# Patient Record
Sex: Male | Born: 1935 | Race: Black or African American | Hispanic: No | Marital: Single | Smoking: Former smoker
Health system: Southern US, Community
[De-identification: ages and names within clinical notes are randomized; demographics above are authoritative.]

## PROBLEM LIST (undated history)

## (undated) DIAGNOSIS — Z720 Tobacco use: Secondary | ICD-10-CM

## (undated) DIAGNOSIS — I10 Essential (primary) hypertension: Secondary | ICD-10-CM

## (undated) DIAGNOSIS — R0602 Shortness of breath: Secondary | ICD-10-CM

## (undated) DIAGNOSIS — E785 Hyperlipidemia, unspecified: Secondary | ICD-10-CM

## (undated) DIAGNOSIS — Z9289 Personal history of other medical treatment: Secondary | ICD-10-CM

## (undated) DIAGNOSIS — I4589 Other specified conduction disorders: Principal | ICD-10-CM

## (undated) HISTORY — DX: Personal history of other medical treatment: Z92.89

## (undated) HISTORY — DX: Shortness of breath: R06.02

## (undated) HISTORY — DX: Essential (primary) hypertension: I10

## (undated) HISTORY — DX: Other specified conduction disorders: I45.89

## (undated) HISTORY — DX: Tobacco use: Z72.0

## (undated) HISTORY — DX: Hyperlipidemia, unspecified: E78.5

---

## 2000-06-29 ENCOUNTER — Ambulatory Visit (HOSPITAL_COMMUNITY): Admission: RE | Admit: 2000-06-29 | Discharge: 2000-06-29 | Payer: Self-pay | Admitting: *Deleted

## 2000-06-29 ENCOUNTER — Encounter (INDEPENDENT_AMBULATORY_CARE_PROVIDER_SITE_OTHER): Payer: Self-pay | Admitting: Specialist

## 2006-03-21 ENCOUNTER — Encounter (INDEPENDENT_AMBULATORY_CARE_PROVIDER_SITE_OTHER): Payer: Self-pay | Admitting: *Deleted

## 2006-03-21 ENCOUNTER — Ambulatory Visit (HOSPITAL_COMMUNITY): Admission: RE | Admit: 2006-03-21 | Discharge: 2006-03-21 | Payer: Self-pay | Admitting: *Deleted

## 2007-11-06 ENCOUNTER — Ambulatory Visit (HOSPITAL_COMMUNITY): Admission: RE | Admit: 2007-11-06 | Discharge: 2007-11-06 | Payer: Self-pay | Admitting: *Deleted

## 2010-09-21 NOTE — Op Note (Signed)
NAMESAMRAT, HAYWARD NO.:  [id number]   MEDICAL RECORD NO.:  [id number]          PATIENT TYPE:  AMB   LOCATION:  ENDO                         FACILITY:  [facility]   PHYSICIAN:  Georgiana Spinner, M.D.    DATE OF BIRTH:  December 19, 1935   DATE OF PROCEDURE:  DATE OF DISCHARGE:                               OPERATIVE REPORT   PROCEDURE:  Colonoscopy.   INDICATIONS:  Colon polyps.   ANESTHESIA:  Fentanyl 100 mcg, Versed 8 mg.   DESCRIPTION OF PROCEDURE:  With the patient mildly sedated in the left  lateral decubitus position a rectal exam was performed, which was  unremarkable.  The prostate felt normal to my exam.  Subsequently, the  Pentax videoscopic colonoscope was inserted into the rectum and passed  under direct vision to the cecum, identified by the ileocecal valve and  appendiceal orifice. both of which were photographed.  From this point,  the colonoscope was slowly withdrawn, taking circumferential views of  colonic mucosa, stopping to photograph diverticulosis seen along the way  that was moderately severe in the sigmoid colon until we reached the  rectum which appeared normal on direct and showed hemorrhoids on  retroflexed view.  The endoscope was straightened and withdrawn.  The  patient's vital signs and pulse oximeter remained stable.  The patient  tolerated the procedure well without apparent complications.   FINDINGS:  Internal hemorrhoids, diverticulosis, moderately severe of  the sigmoid colon.  There was a minimal amount of redness at the distal  end of the sigmoid, indicating some mild inflammation in this area.   PLAN:  I will have patient follow-up with me as needed and approximately  5 years for repeat examination.           ______________________________  Georgiana Spinner, M.D.     GMO/MEDQ  D:  11/06/2007  T:  11/06/2007  Job:  [id number]   cc:   Brett Canales A. Cleta Alberts, M.D.  Fax: [phone]

## 2010-09-24 NOTE — Op Note (Signed)
Louis Bryan, Louis Bryan NO.:  [id number]   MEDICAL RECORD NO.:  [id number]          PATIENT TYPE:  AMB   LOCATION:  ENDO                         FACILITY:  [facility]   PHYSICIAN:  Georgiana Spinner, M.D.    DATE OF BIRTH:  [birth year]   DATE OF PROCEDURE:  03/21/2006  DATE OF DISCHARGE:                                 OPERATIVE REPORT   PROCEDURE:  Colonoscopy with polypectomy and biopsy.   ENDOSCOPIST:  Georgiana Spinner, M.D.   INDICATIONS:  Colon polyps.   ANESTHESIA:  Fentanyl 100 mcg, Versed 10 mg.   PROCEDURE:  With the patient mildly sedated in the left lateral decubitus  position, the Olympus videoscopic colonoscope was inserted into the rectum  after a rectal examination was attempted and passed under direct vision to  the cecum, identified by crow's foot of the cecum and ileocecal valve. In  the ascending colon were 2 very large polyps, both over a centimeter in  size.  Both were photographed and each was removed, the first, the larger,  of which was removed first using snare cautery technique, setting of 20/200  blended current.  The polyp base had to be touched with the hot biopsy  forceps to eradicate the polypoid tissue remaining at the base and the polyp  then was grasped with a Roth retrieval basket and pulled out all the way to  retrieve it.  Once the tissue was retrieved for Pathology, the endoscope was  then reinserted in the rectum and once again passed under direct vision all  the way back to the cecum for the second time, where the second polyp was  seen; it too was then removed using snare cautery technique after placing  the colonoscope in the correct position and once it had been resected, the  polyp was once again contained in a Roth retrieval basket and the  colonoscope was withdrawn all the way out once again so that the second  polyp could be placed in the specimen jar with the first, since they were  adjacent.  The endoscope was then reinserted  and passed under direct vision  once again for the third time, back to the cecum, from which point the  colonoscope was then slowly withdrawn, taking circumferential views of the  colonic mucosa as we withdrew all the way then to the rectum, which appeared  clean, and rinsing and suctioning as we went until we reached the rectum,  which appeared normal on direct and showed hemorrhoids on retroflexed view.  The endoscope was straightened and withdrawn.  The patient's vital signs and  pulse oximetry remained stable.  The patient tolerated the procedure well  and without apparent complication.   FINDINGS:  1. Two large polyps of the ascending colon, both removed.  2. Diverticulosis throughout the colon, mostly in the sigmoid area.  3. Internal hemorrhoids were noted.   PLAN:  Await biopsy report.  The patient will call me for results and follow  up with me as an outpatient and probably because of the size of these  polyps, I would  recommend repeat examination in 1 year.           ______________________________  Georgiana Spinner, M.D.     GMO/MEDQ  D:  03/21/2006  T:  03/21/2006  Job:  [id number]   cc:   Jonita Albee, M.D.

## 2010-09-24 NOTE — Procedures (Signed)
[facility]  Patient:    Louis Bryan, Louis Bryan                           MRN: [id number] Proc. Date: 06/29/00 Adm. Date:  [id number] Attending:  Sabino Gasser CC:         Robert Bellow, M.D., [facility]   Procedure Report  PROCEDURE:  Colonoscopy with biopsy, polypectomy.  INDICATION FOR PROCEDURE:  Iron deficiency anemia, Hemoccult positive stools.  ANESTHESIA:  An additional Demerol 10 mg, Versed 1 mg.  DESCRIPTION OF PROCEDURE:  With the patient mildly sedated in the left lateral decubitus position, the Olympus videoscopic colonoscope was inserted in the rectum and passed under direct vision to the cecum. The cecum was identified by the ileocecal valve and appendiceal orifice both of which were photographed and appeared normal. We entered into the terminal ileum which appeared to be mildly inflamed but probably nonspecifically. This was photographed and biopsies were taken. The endoscope was then withdrawn taking circumferential views of the entire colonic mucosa, stopping about 1-2 folds removed from the ileocecal valve when a small crescent-like protuberance was noted. It appeared to be a small lipoma but it was biopsied only at this point. From this point, the endoscope was then withdrawn further until we reached near the hepatic flexure area where a very large multilobulated polyp was seen on a short stalk. After positioning the scope in the appropriate area, we used snare cautery technique in a piecemeal fashion to remove the multiple heads of this polyp. After doing so and feeling assured that we had removed all the polypoid tissue down to the base using the snare cautery technique with blended current on 3:3 settings. A Roth retrieval net was used to retrieve the large polypoid pieces with multiple trips back and forth through the colon to retrieve the polyp substance. Once it was fully retrieved by this manner, the endoscope was reinserted to this  level and slowly withdrawn taking circumferential views of the remaining colonic mucosa stopping then in the rectum where a fold appeared to be thickened and it was felt this may have been due to the repeated need to insert the colonoscope but it was biopsied anyway. To be certain of this, the endoscope was then pulled back further and placed in retroflexion to view the anal canal from above and this too appeared normal. The endoscope was then straightened and withdrawn. The patients vital signs and pulse oximeter remained stable. The patient tolerated the procedure well without apparent complications.  FINDINGS: 1. Mild inflammatory changes of terminal ileum possibly benign normal variant. 2. 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 3. Large polyp on a stalk in the hepatic flexure area of the colon. 4. Thickened fold of the rectum.  PLAN:  Await biopsy report. The patient will call me for results and followup with me in as an outpatient. DD:  06/29/00 TD:  06/30/00 Job: [id number]

## 2010-09-24 NOTE — Procedures (Signed)
[facility]  Patient:    Louis Bryan, Louis Bryan                           MRN: [id number] Proc. Date: 06/29/00 Adm. Date:  [id number] Attending:  Sabino Gasser CC:         Robert Bellow, M.D., [facility]   Procedure Report  PROCEDURE:  Upper endoscopy.  INDICATION FOR PROCEDURE:  Iron deficiency anemia, heme positive stools.  ANESTHESIA:  Demerol 70 mg, Versed 7 mg.  DESCRIPTION OF PROCEDURE:  With the patient mildly sedated in the left lateral decubitus position, the Olympus videoscopic endoscope was inserted in the mouth and passed under direct vision through the esophagus into the stomach. The fundus, body, antrum, duodenal bulb, and second portion of the duodenum were all well visualized and appeared normal. From this point, the endoscope was slowly withdrawn taking circumferential views of the entire duodenal mucosa until the endoscope was then pulled back into the stomach, placed in retroflexion to view the stomach from below, this too appeared normal and was photographed. The endoscope was straightened and withdrawn taking circumferential views of the entire gastric and subsequently esophageal mucosa stopping only in the fundus of the stomach where multiple small volcano-like erosions were seen. They had erythematous tops and multiple biopsies were taken. The endoscope was withdrawn. The patients vital signs and pulse oximeter remained stable. The patient tolerated the procedure well without apparent complications.  FINDINGS:  Multiple volcano-like erosions of the fundus of the stomach possibly NSAID induced. Await biopsy report. The patient will call me for results and follow-up with me as an outpatient. Proceed to colonoscopy. DD:  06/29/00 TD:  06/30/00 Job: [id number]

## 2011-05-24 ENCOUNTER — Ambulatory Visit (INDEPENDENT_AMBULATORY_CARE_PROVIDER_SITE_OTHER): Payer: Medicare Other | Admitting: Emergency Medicine

## 2011-05-24 DIAGNOSIS — E119 Type 2 diabetes mellitus without complications: Secondary | ICD-10-CM

## 2011-05-24 DIAGNOSIS — I10 Essential (primary) hypertension: Secondary | ICD-10-CM

## 2011-09-20 ENCOUNTER — Encounter: Payer: Self-pay | Admitting: Emergency Medicine

## 2011-09-20 ENCOUNTER — Ambulatory Visit (INDEPENDENT_AMBULATORY_CARE_PROVIDER_SITE_OTHER): Payer: Medicare Other | Admitting: Emergency Medicine

## 2011-09-20 VITALS — BP 138/83 | HR 67 | Temp 97.8°F | Resp 18 | Ht 67.0 in | Wt 195.8 lb

## 2011-09-20 DIAGNOSIS — E785 Hyperlipidemia, unspecified: Secondary | ICD-10-CM

## 2011-09-20 DIAGNOSIS — I10 Essential (primary) hypertension: Secondary | ICD-10-CM | POA: Insufficient documentation

## 2011-09-20 LAB — COMPREHENSIVE METABOLIC PANEL
AST: 15 U/L (ref 0–37)
Albumin: 4.4 g/dL (ref 3.5–5.2)
Alkaline Phosphatase: 90 U/L (ref 39–117)
CO2: 27 mEq/L (ref 19–32)
Calcium: 9.9 mg/dL (ref 8.4–10.5)
Glucose, Bld: 106 mg/dL — ABNORMAL HIGH (ref 70–99)
Potassium: 4.1 mEq/L (ref 3.5–5.3)
Sodium: 141 mEq/L (ref 135–145)
Total Protein: 7.6 g/dL (ref 6.0–8.3)

## 2011-09-20 LAB — LIPID PANEL
HDL: 41 mg/dL (ref >39)
LDL Cholesterol: 97 mg/dL (ref 0–99)
Total CHOL/HDL Ratio: 4.1 Ratio
VLDL: 30 mg/dL (ref 0–40)

## 2011-09-20 MED ORDER — LISINOPRIL-HYDROCHLOROTHIAZIDE 10-12.5 MG PO TABS: 1.0000 | ORAL_TABLET | Freq: Every day | ORAL | Status: DC

## 2011-09-20 MED ORDER — METOPROLOL SUCCINATE ER 50 MG PO TB24: 50.0000 mg | ORAL_TABLET | Freq: Every day | ORAL | Status: DC

## 2011-09-20 MED ORDER — SIMVASTATIN 20 MG PO TABS: 20.0000 mg | ORAL_TABLET | Freq: Every evening | ORAL | Status: DC

## 2011-09-20 NOTE — Progress Notes (Signed)
  Subjective:    Patient ID: Louis Bryan, male    DOB: 1936-01-24, 76 y.o.   MRN: [id number]  HPI patient enters for followup high cholesterol and high blood pressure. He has no chest pain no shortness of breath no swelling in his ankles. He is tolerating his medications without any difficulty    Review of Systems     Objective:   Physical Exam  Constitutional: He appears well-developed.  Eyes: Pupils are equal, round, and reactive to light.  Neck: No thyromegaly present.  Cardiovascular: Normal rate and regular rhythm.   Pulmonary/Chest: Effort normal and breath sounds normal. No respiratory distress.  Abdominal: Soft.  Musculoskeletal: Normal range of motion.          Assessment & Plan:  Patient did well all medications refilled have ordered a cmet and lipid panel

## 2011-09-21 ENCOUNTER — Encounter: Payer: Self-pay | Admitting: *Deleted

## 2011-10-06 ENCOUNTER — Ambulatory Visit: Payer: Medicare Other

## 2011-10-06 ENCOUNTER — Ambulatory Visit (INDEPENDENT_AMBULATORY_CARE_PROVIDER_SITE_OTHER): Payer: Medicare Other | Admitting: Family Medicine

## 2011-10-06 VITALS — BP 116/71 | HR 67 | Temp 97.5°F | Resp 18 | Ht 67.0 in | Wt 194.2 lb

## 2011-10-06 DIAGNOSIS — M25569 Pain in unspecified knee: Secondary | ICD-10-CM

## 2011-10-06 NOTE — Progress Notes (Signed)
Patient Name: Louis Bryan Date of Birth: Jan 14, 1936 Medical Record Number: [id number] Gender: male Date of Encounter: 10/06/2011  History of Present Illness:  Louis Bryan is a 76 y.o. very pleasant male patient who presents with the following:  Has noted right knee pain for about 2 days. No known injury, started "all of a sudden."  He has noted some popping and the knee has felt unstable at times.  Today it actually seems better and is not really hurting.  This has never happened before, no history of knee surgery.  Otherwise he is feeling well today  Patient Active Problem List  Diagnoses  . Hypertension  . Hyperlipidemia   No past medical history on file. No past surgical history on file. History  Substance Use Topics  . Smoking status: Former Games developer  . Smokeless tobacco: Not on file  . Alcohol Use: Not on file   No family history on file. No Known Allergies  Medication list has been reviewed and updated.  Prior to Admission medications   Medication Sig Start Date End Date Taking? Authorizing Provider  aspirin 81 MG tablet Take 81 mg by mouth daily.   Yes Historical Provider, MD  docusate sodium (COLACE) 50 MG capsule Take by mouth daily. Patient takes OTC stool softener in addition to colace tablet.   Yes Historical Provider, MD  lisinopril-hydrochlorothiazide (PRINZIDE,ZESTORETIC) 10-12.5 MG per tablet Take 1 tablet by mouth daily. 09/20/11  Yes Collene Gobble, MD  metoprolol succinate (TOPROL-XL) 50 MG 24 hr tablet Take 1 tablet (50 mg total) by mouth daily. Take with or immediately following a meal. 09/20/11  Yes Collene Gobble, MD  simvastatin (ZOCOR) 20 MG tablet Take 1 tablet (20 mg total) by mouth every evening. 09/20/11  Yes Collene Gobble, MD    Review of Systems: As per HPI- otherwise negative.   Physical Examination: Filed Vitals:   10/06/11 0746  BP: 116/71  Pulse: 67  Temp: 97.5 F (36.4 C)  Resp: 18   Filed Vitals:   10/06/11 0746  Height: 5\' 7"  (1.702 m)    Weight: 194 lb 3.2 oz (88.089 kg)   Body mass index is 30.42 kg/(m^2).  GEN: WDWN, NAD, Non-toxic, A & O x 3 HEENT: Atraumatic, Normocephalic. Neck supple. No masses, No LAD. Ears and Nose: No external deformity. CV: RRR, No M/G/R. No JVD. No thrill. No extra heart sounds. PULM: CTA B, no wheezes, crackles, rhonchi. No retractions. No resp. distress. No accessory muscle use. EXTR: No c/c/e NEURO Normal gait- no limp PSYCH: Normally interactive. Conversant. Not depressed or anxious appearing.  Calm demeanor.  Right knee: no effusion, no heat or redness/ swelling.  There is some laxity to valgus stress, but no pain.  Normal patellar DTR bilaterally, normal ROM and strength to knee flexion and extension.  No sign of ACL tear  [facility] reading (PRIMARY) by  Dr. Patsy Lager.  Right knee: patellofemoral compartment loss- otherwise normal knee  RIGHT KNEE - COMPLETE 4+ VIEW  Comparison: None.  Findings: No joint effusion or fracture. No significant degenerative changes.  IMPRESSION: No acute findings.  Assessment and Plan: 1. 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  However, today he is feeling pretty well and the knee feels a lot better.  Gave copy of xrays on CD.  He may use OTC tylenol as needed for pain.  If his symptoms return to the extent that he wants to look at  this further he will call me and I will refer him to ortho  Abbe Amsterdam, MD 10/06/2011 7:59 AM

## 2012-01-24 ENCOUNTER — Ambulatory Visit (INDEPENDENT_AMBULATORY_CARE_PROVIDER_SITE_OTHER): Payer: Medicare Other | Admitting: Emergency Medicine

## 2012-01-24 VITALS — BP 131/88 | HR 58 | Temp 97.4°F | Resp 16 | Ht 67.5 in | Wt 197.0 lb

## 2012-01-24 DIAGNOSIS — Z23 Encounter for immunization: Secondary | ICD-10-CM

## 2012-01-24 DIAGNOSIS — I10 Essential (primary) hypertension: Secondary | ICD-10-CM

## 2012-01-24 DIAGNOSIS — E785 Hyperlipidemia, unspecified: Secondary | ICD-10-CM

## 2012-01-24 DIAGNOSIS — E782 Mixed hyperlipidemia: Secondary | ICD-10-CM

## 2012-01-24 NOTE — Progress Notes (Signed)
  Subjective:    Patient ID: Louis Bryan, male    DOB: 1935-08-10, 76 y.o.   MRN: [id number]  HPI patient enters for followup of his high blood pressure and high cholesterol. He has no complaints at the present time he has no chest pain shortness of breath swelling of his legs he feels great.    Review of Systems     Objective:   Physical Exam  Constitutional: He is oriented to person, place, and time. He appears well-developed.  HENT:  Head: Normocephalic.  Eyes: Pupils are equal, round, and reactive to light.  Neck: No thyromegaly present.  Cardiovascular: Normal rate and regular rhythm.  Exam reveals no gallop and no friction rub.   No murmur heard. Pulmonary/Chest: Effort normal and breath sounds normal. No respiratory distress. He has no wheezes. He has no rales. He exhibits no tenderness.  Abdominal: Soft. Bowel sounds are normal. He exhibits no distension and no mass. There is no tenderness. There is no rebound and no guarding.  Musculoskeletal: Normal range of motion.  Neurological: He is alert and oriented to person, place, and time.          Assessment & Plan:  Routine labs  not done today because patient is doing so well. He'll continue on all his current medications. He will return to clinic in January have his physical at that time. Flu shot was administered this visit

## 2012-06-05 ENCOUNTER — Ambulatory Visit (INDEPENDENT_AMBULATORY_CARE_PROVIDER_SITE_OTHER): Payer: Medicare Other | Admitting: Emergency Medicine

## 2012-06-05 ENCOUNTER — Encounter: Payer: Self-pay | Admitting: Emergency Medicine

## 2012-06-05 ENCOUNTER — Ambulatory Visit: Payer: Medicare Other

## 2012-06-05 VITALS — BP 133/85 | HR 67 | Temp 98.2°F | Resp 16 | Ht 68.0 in | Wt 198.0 lb

## 2012-06-05 DIAGNOSIS — M542 Cervicalgia: Secondary | ICD-10-CM

## 2012-06-05 DIAGNOSIS — Z139 Encounter for screening, unspecified: Secondary | ICD-10-CM

## 2012-06-05 DIAGNOSIS — Z0000 Encounter for general adult medical examination without abnormal findings: Secondary | ICD-10-CM

## 2012-06-05 DIAGNOSIS — M549 Dorsalgia, unspecified: Secondary | ICD-10-CM

## 2012-06-05 LAB — PSA, MEDICARE: PSA: 0.66 ng/mL (ref <=4.00)

## 2012-06-05 LAB — COMPREHENSIVE METABOLIC PANEL
ALT: 13 U/L (ref 0–53)
BUN: 13 mg/dL (ref 6–23)
CO2: 22 mEq/L (ref 19–32)
Chloride: 105 mEq/L (ref 96–112)
Creat: 1.21 mg/dL (ref 0.50–1.35)
Glucose, Bld: 110 mg/dL — ABNORMAL HIGH (ref 70–99)
Potassium: 3.9 mEq/L (ref 3.5–5.3)
Sodium: 142 mEq/L (ref 135–145)
Total Bilirubin: 0.8 mg/dL (ref 0.3–1.2)
Total Protein: 7.3 g/dL (ref 6.0–8.3)

## 2012-06-05 LAB — IFOBT (OCCULT BLOOD): IFOBT: NEGATIVE

## 2012-06-05 LAB — LIPID PANEL
Cholesterol: 164 mg/dL (ref 0–200)
HDL: 45 mg/dL (ref >39)
LDL Cholesterol: 93 mg/dL (ref 0–99)
Total CHOL/HDL Ratio: 3.6 Ratio
Triglycerides: 129 mg/dL (ref <150)
VLDL: 26 mg/dL (ref 0–40)

## 2012-06-05 LAB — CBC WITH DIFFERENTIAL/PLATELET
MCH: 31.2 pg (ref 26.0–34.0)
MCV: 90.2 fL (ref 78.0–100.0)
Monocytes Absolute: 0.8 10*3/uL (ref 0.1–1.0)
Neutrophils Relative %: 49 % (ref 43–77)
Platelets: 233 10*3/uL (ref 150–400)
RDW: 14.5 % (ref 11.5–15.5)
WBC: 8.8 10*3/uL (ref 4.0–10.5)

## 2012-06-05 NOTE — Progress Notes (Signed)
@[facility] @  Patient ID: Jaclyn Carew MRN: [id number], DOB: [birth year] 77 y.o. Date of Encounter: 06/05/2012, 10:21 AM  Primary Physician: No primary provider on file.  Chief Complaint: Physical (CPE)  HPI: 77 y.o. y/o male with history noted below here for CPE.  Doing well. No issues/complaints.  Review of Systems:  Consitutional: No fever, chills, fatigue, night sweats, lymphadenopathy, or weight changes. Eyes: No visual changes, eye redness is a continued problem but only in the morning and he does not have to use drops for this, or discharge. ENT/Mouth: Ears: No otalgia, tinnitus, hearing loss, discharge. Nose: No congestion, rhinorrhea, sinus pain, or epistaxis. Throat: No sore throat, post nasal drip, or teeth pain. Cardiovascular: No CP, palpitations, diaphoresis, DOE, edema, orthopnea, PND. Respiratory: No cough, hemoptysis, SOB, or wheezing. Gastrointestinal: No anorexia, dysphagia, reflux, pain, nausea, vomiting, hematemesis, diarrhea, constipation, BRBPR, or melena. Genitourinary: No dysuria, frequency, urgency, hematuria, incontinence, nocturia, decreased urinary stream, discharge, impotence, or testicular pain/masses. Musculoskeletal: No decreased ROM, myalgias, stiffness, joint swelling, or weakness he is difficulty with his neck and his low back and pain when he moves at times.. Skin: No rash, erythema, lesion changes, pain, warmth, jaundice, or pruritis. Neurological: No headache, dizziness, syncope, seizures, tremors, memory loss, coordination problems, or paresthesias. Psychological: No anxiety, depression, hallucinations, SI/HI. Endocrine: No fatigue, polydipsia, polyphagia, polyuria, or known diabetes. All other systems were reviewed and are otherwise negative.  No past medical history on file.   No past surgical history on file.  Home Meds:  Prior to Admission medications   Medication Sig Start Date End Date Taking? Authorizing Provider  aspirin 81 MG tablet Take 81  mg by mouth daily.   Yes Historical Provider, MD  docusate sodium (COLACE) 50 MG capsule Take by mouth daily. Patient takes OTC stool softener in addition to colace tablet.   Yes Historical Provider, MD  lisinopril-hydrochlorothiazide (PRINZIDE,ZESTORETIC) 10-12.5 MG per tablet Take 1 tablet by mouth daily. 09/20/11  Yes Collene Gobble, MD  metoprolol succinate (TOPROL-XL) 50 MG 24 hr tablet Take 1 tablet (50 mg total) by mouth daily. Take with or immediately following a meal. 09/20/11  Yes Collene Gobble, MD  simvastatin (ZOCOR) 20 MG tablet Take 1 tablet (20 mg total) by mouth every evening. 09/20/11  Yes Collene Gobble, MD    Allergies: No Known Allergies  History   Social History  . Marital Status: Single    Spouse Name: N/A    Number of Children: N/A  . Years of Education: N/A   Occupational History  . Not on file.   Social History Main Topics  . Smoking status: Former Games developer  . Smokeless tobacco: Not on file  . Alcohol Use: Not on file  . Drug Use: Not on file  . Sexually Active: Not on file   Other Topics Concern  . Not on file   Social History Narrative  . No narrative on file    No family history on file.  Physical Exam:  Blood pressure 133/85, pulse 67, temperature 98.2 F (36.8 C), resp. rate 16, height 5\' 8"  (1.727 m), weight 198 lb (89.812 kg).  General: Well developed, well nourished, in no acute distress. HEENT: Normocephalic, atraumatic. Conjunctiva pink, sclera non-icteric. Pupils 2 mm constricting to 1 mm, round, regular, and equally reactive to light and accomodation. EOMI. Internal auditory canal clear. TMs with good cone of light and without pathology. Nasal mucosa pink. Nares are without discharge. No sinus tenderness. Oral mucosa pink. Dentition . Pharynx without exudate.  Neck: Supple. Trachea midline. No thyromegaly. Full ROM. No lymphadenopathy. Lungs: Clear to auscultation bilaterally without wheezes, rales, or rhonchi. Breathing is of normal effort  and unlabored. Cardiovascular: RRR with S1 S2. No murmurs, rubs, or gallops appreciated. Distal pulses 2+ symmetrically. No carotid or abdominal bruits.  Abdomen: Soft, non-tender, non-distended with normoactive bowel sounds. No hepatosplenomegaly or masses. No rebound/guarding. No CVA tenderness. He does have a small umbilical hernia    Rectal: No external hemorrhoids or fissures. Rectal vault without masses.   Genitourinary: un circumcised male. No penile lesions. Testes descended bilaterally, and smooth without tenderness or masses.  Musculoskeletal: Full range of motion and 5/5 strength throughout. Without swelling, atrophy, there is tenderness noted over the paracervical and also over the paralumbar muscles without focal neurological defects the  crepitus, or warmth. Extremities without clubbing, cyanosis, or edema. Calves supple. Skin: Warm and moist without erythema, ecchymosis, wounds, or rash. Neuro: A+Ox3. CN II-XII grossly intact. Moves all extremities spontaneously. Full sensation throughout. Normal gait. DTR 2+ throughout upper and lower extremities. Finger to nose intact. Psych:  Responds to questions appropriately with a normal affect.  [facility] reading (PRIMARY) by  Dr. Cleta Alberts LS-spine films show mild arthritic change. C-spine films C6 and 7 are not well-seen. There are some mild degenerative changes present of the mid C-spine. .  Studies: CBC, CMET, Lipid, PSA, TSH,       Assessment/Plan:  77 y.o. y/o   male here for CPE he appears he is up-to-date on all his immunizations except for shingles. He is not due yet for colonoscopy. We'll check films of his C-spine and lumbar spine to get an idea of how bad his arthritis is. He does have high blood pressure and high cholesterol and has not had a cardiology evaluation for years.  -  Signed, Earl Lites, MD 06/05/2012 10:21 AM

## 2012-06-13 ENCOUNTER — Ambulatory Visit (INDEPENDENT_AMBULATORY_CARE_PROVIDER_SITE_OTHER): Payer: Medicare Other | Admitting: Family Medicine

## 2012-06-13 VITALS — BP 124/76 | HR 72 | Temp 98.6°F | Resp 20 | Ht 67.0 in | Wt 197.6 lb

## 2012-06-13 DIAGNOSIS — R05 Cough: Secondary | ICD-10-CM

## 2012-06-13 DIAGNOSIS — J208 Acute bronchitis due to other specified organisms: Secondary | ICD-10-CM

## 2012-06-13 DIAGNOSIS — J209 Acute bronchitis, unspecified: Secondary | ICD-10-CM

## 2012-06-13 DIAGNOSIS — R059 Cough, unspecified: Secondary | ICD-10-CM

## 2012-06-13 MED ORDER — AZITHROMYCIN 250 MG PO TABS: ORAL_TABLET | ORAL | Status: DC

## 2012-06-13 MED ORDER — HYDROCODONE-HOMATROPINE 5-1.5 MG/5ML PO SYRP: 5.0000 mL | ORAL_SOLUTION | ORAL | Status: DC | PRN

## 2012-06-13 MED ORDER — BENZONATATE 100 MG PO CAPS: ORAL_CAPSULE | ORAL | Status: DC

## 2012-06-13 NOTE — Progress Notes (Signed)
Subjective: 77 year old gentleman who is here with a cough been going on for for 5 days. He has not had much upper respiratory congestion. Minimal sneezing. No headache and myalgias. He did get his flu shot. He has not been wheezing. He has not been terribly ill with this, just wants to be sure that he does not take anything to his mother on her 103rd birthday. He does not smoke. He is a former Education officer, environmental. He lives with his daughter, she has not been sick. He visits his mother was daily.  Objective: Pleasant gentleman in no major distress. TMs normal. Throat clear. Neck supple without nodes. Chest is clear to auscultation. Heart regular without murmurs. He is coughing up some phlegm.  Assessment: Viral bronchitis  Plan: We'll go ahead and give him a Z-Pak even though it may well not be necessary. This is a cough that will likely take a few more days to run its course. Do not want him to transmit anything tenderness mother cough.

## 2012-06-13 NOTE — Patient Instructions (Signed)
Practice good handwashing. Drink plenty of fluids. Get enough rest.  They cough tablets in the daytime when you do not want to be drowsy, but take cough syrup at night which will be sedating and do a better job with cough.  Take the antibiotic as directed.  Return if worse

## 2012-07-03 ENCOUNTER — Telehealth: Payer: Self-pay | Admitting: Emergency Medicine

## 2012-07-03 NOTE — Telephone Encounter (Signed)
Left message for him to call me back.  

## 2012-07-03 NOTE — Telephone Encounter (Signed)
Please call patient and find out why he will not answer the call to [facility]. See if he is willing to see a cardiologist.

## 2012-07-12 NOTE — Telephone Encounter (Signed)
Spoke w/pt and explained that [facility] has been trying to get in touch w/him to set up appt w/cardiologist because Dr Cleta Alberts had wanted him to get an evaluation for HTN and hyperlipidemia. Pt didn't realize that Dr Cleta Alberts had wanted him to do this. I advised pt that I can give him their number and he can call them to set up appt. Pt stated he is driving and does not have anything to write it down on and asked me to CB in a few minutes. I called pt back and he was able to write down [facility] # and agreed to call them and tell them Dr Cleta Alberts had referred him to see a cardiologist and to set up appt.  Dr Cleta Alberts, forwarding this back FYI.

## 2012-09-11 ENCOUNTER — Telehealth: Payer: Self-pay

## 2012-09-11 NOTE — Telephone Encounter (Signed)
Louis Bryan WITH [employer] IN NEED OF THE HB AIC DONE ON PT ON 05/24/11 PLEASE FAX TO [phone] AND YOU MAY REACH Louis Bryan  AT [phone] IF NEEDED

## 2012-09-11 NOTE — Telephone Encounter (Signed)
HB A1C faxed with confirmation to [phone].

## 2012-09-14 ENCOUNTER — Encounter: Payer: Self-pay | Admitting: Cardiology

## 2012-09-18 ENCOUNTER — Ambulatory Visit (INDEPENDENT_AMBULATORY_CARE_PROVIDER_SITE_OTHER): Payer: Medicare Other | Admitting: Emergency Medicine

## 2012-09-18 VITALS — BP 118/78 | HR 66 | Temp 99.0°F | Resp 16 | Ht 68.0 in | Wt 194.0 lb

## 2012-09-18 DIAGNOSIS — E785 Hyperlipidemia, unspecified: Secondary | ICD-10-CM

## 2012-09-18 DIAGNOSIS — I10 Essential (primary) hypertension: Secondary | ICD-10-CM

## 2012-09-18 NOTE — Progress Notes (Signed)
  Subjective:    Patient ID: Louis Bryan, male    DOB: July 12, 1935, 77 y.o.   MRN: [id number]  HPI patient in for followup of hypertension hyperlipidemia. He has no complaints of chest pain shortness of breath or GI disturbances. He's tolerating his medications well    Review of Systems     Objective:   Physical Exam blood pressure is as recorded. His neck is supple. His chest is clear to auscultation and percussion. His cardiac exam reveals a regular rate without murmurs or gallops. The abdomen is soft liver spleen not enlarged no areas of tenderness        Assessment & Plan:  Patient stable at present. No changes in his medications plan. Encouraged him to be physically active and exercise

## 2012-09-19 ENCOUNTER — Encounter: Payer: Self-pay | Admitting: *Deleted

## 2012-09-19 ENCOUNTER — Encounter (INDEPENDENT_AMBULATORY_CARE_PROVIDER_SITE_OTHER): Payer: Medicare Other

## 2012-09-19 DIAGNOSIS — R0602 Shortness of breath: Secondary | ICD-10-CM

## 2012-09-19 LAB — COMPREHENSIVE METABOLIC PANEL
ALT: 12 U/L (ref 0–53)
AST: 12 U/L (ref 0–37)
Albumin: 4.1 g/dL (ref 3.5–5.2)
Alkaline Phosphatase: 68 U/L (ref 39–117)
BUN: 18 mg/dL (ref 6–23)
Calcium: 9.4 mg/dL (ref 8.4–10.5)
Total Protein: 7.1 g/dL (ref 6.0–8.3)

## 2012-09-19 LAB — LIPID PANEL
HDL: 43 mg/dL (ref >39)
Total CHOL/HDL Ratio: 3.7 Ratio

## 2012-09-24 ENCOUNTER — Encounter: Payer: Self-pay | Admitting: Cardiology

## 2012-09-24 ENCOUNTER — Ambulatory Visit (INDEPENDENT_AMBULATORY_CARE_PROVIDER_SITE_OTHER): Payer: Medicare Other | Admitting: Cardiology

## 2012-09-24 VITALS — BP 110/70 | HR 80 | Ht 67.0 in | Wt 199.0 lb

## 2012-09-24 DIAGNOSIS — I4589 Other specified conduction disorders: Secondary | ICD-10-CM

## 2012-09-24 DIAGNOSIS — I10 Essential (primary) hypertension: Secondary | ICD-10-CM

## 2012-09-24 DIAGNOSIS — E785 Hyperlipidemia, unspecified: Secondary | ICD-10-CM

## 2012-09-24 HISTORY — DX: Other specified conduction disorders: I45.89

## 2012-09-24 MED ORDER — METOPROLOL SUCCINATE ER 25 MG PO TB24: 25.0000 mg | ORAL_TABLET | Freq: Every day | ORAL | Status: DC

## 2012-09-24 NOTE — Progress Notes (Signed)
[facility]  Clinic Note:  Patient ID: Louis Bryan, male   DOB: [birth year], 77 y.o.   MRN: [id number]  HPI: Louis Bryan is a 77 y.o. male with a PMH below who presents today for followup for CPET. test .  Interval History: He continues to do well with no major problems. He denies any symptoms of fatigue or tiredness. He does have some morning cough is but nothing significant. No significant wheezing. He had no problems during the stress test. He felt well otherwise. He does note occasional palpitations.  Cardiovascular ROS: negative for - chest pain, dyspnea on exertion, edema, loss of consciousness, murmur, paroxysmal nocturnal dyspnea or rapid heart rate Additional cardiac review of systems: Lightheadedness - no, dizzinesscno, syncope/near-syncope - no; TIA/amaurosis fugax - no Melena - no, hematochezia no; hematuria - no; nosebleeds - no; claudication - his legs do get somewhat tired but after maybe a half an hour of walking.  No Known Allergies  Current Outpatient Prescriptions  Medication Sig Dispense Refill  . aspirin 81 MG tablet Take 81 mg by mouth daily.      Marland Kitchen docusate sodium (COLACE) 50 MG capsule Take by mouth daily. Patient takes OTC stool softener in addition to colace tablet.      Marland Kitchen lisinopril-hydrochlorothiazide (PRINZIDE,ZESTORETIC) 10-12.5 MG per tablet Take 1 tablet by mouth daily.  30 tablet  11  . metoprolol succinate (TOPROL-XL) 50 MG 24 hr tablet Take 1 tablet (50 mg total) by mouth daily. Take with or immediately following a meal.  30 tablet  11  . simvastatin (ZOCOR) 20 MG tablet Take 1 tablet (20 mg total) by mouth every evening.  30 tablet  11   No current facility-administered medications for this visit.    Past Medical History  Diagnosis Date  . Hypertension   . Hyperlipidemia   . Shortness of breath     On exertion  . Tobacco abuse   . History of exercise stress test [phone]    negative bruce protocol excercise stress test with  scintigraphic evidence of diaphragmatic attenuation and mild apical thinning, dynamic gating was not performed secondary to frequent ectopy, low risk study    History reviewed. No pertinent past surgical history.  History   Social History  . Marital Status: Single    Spouse Name: N/A    Number of Children: 1  . Years of Education: N/A   Occupational History  . Not on file.   Social History Main Topics  . Smoking status: Former Smoker    Quit date: 06/13/1992  . Smokeless tobacco: Never Used  . Alcohol Use: No  . Drug Use: No  . Sexually Active: Not on file   Other Topics Concern  . Not on file   Social History Narrative   He walks about 30 minutes 5-6 days a week.    ROS: A comprehensive Review of Systems - Negative except occasional AM cough Respiratory ROS: negative for - orthopnea, pleuritic pain, shortness of breath, stridor, tachypnea or wheezing  PHYSICAL EXAM BP 110/70  Pulse 80  Ht 5\' 7"  (1.702 m)  Wt 199 lb (90.266 kg)  BMI 31.16 kg/m2 General appearance: alert, cooperative, appears stated age, no distress and mildly obese Neck: no adenopathy, no carotid bruit and no JVD Lungs: clear to auscultation bilaterally, normal percussion bilaterally and non-labored; mild end expiratory wheeze during forced expiration Heart: regular rate and rhythm, S1, S2 normal, no murmur, click, rub or gallop ; non-displaced PMI Abdomen: soft,  non-tender; bowel sounds normal; no masses,  no organomegaly; mild truncal obesity Extremities: extremities normal, atraumatic, no cyanosis or edema Pulses: 2+ and symmetric; Skin: normal or no rash or lesions Neurologic: Mental status: Alert, oriented, thought content appropriate Cranial nerves: normal (II-XII grossly intact)  [id number] today: No  ASSESSMENT: No ischemic findings, but did note chronotropic incompetence  Patient Active Problem List   Diagnosis Date Noted  . Chronotropic incompetence - medication related  09/24/2012    Priority: Medium  . Hypertension 09/20/2011    Priority: Medium  . Hyperlipidemia 09/20/2011    Priority: Medium    PLAN:  No orders of the defined types were placed in this encounter.   Reduce Metoprolol Succinate to 25 mg PO daily  Meds ordered this encounter  Medications  . metoprolol succinate (TOPROL-XL) 25 MG 24 hr tablet    Sig: Take 1 tablet (25 mg total) by mouth daily. Take with or immediately following a meal.    Dispense:  30 tablet    Refill:  11   Follow-up in 6 months with PA/NP and then Dr Herbie Baltimore in 12 months. Plan to recheck CPET at the 6 month f/u.  [name], M.D., M.S. [facility] [address] [place], [place]  [address]  [phone] Pager # [phone] 09/24/2012 11:46 AM

## 2012-09-24 NOTE — Assessment & Plan Note (Signed)
Demonstrated on CPET - HR only from 80 bpm up to 100 bpm.   Cut Metoprolol Succ to 25 mg daily -- ROV 6 months & recheck CPET test to re-assess HR response.

## 2012-09-24 NOTE — Progress Notes (Signed)
Patient ID: Louis Bryan, male   DOB: [birth year], 77 y.o.   MRN: [id number] This is an addendum to the progress note.  Results of the CPET test   Submaximal effort of 0.84 with goal of 1.09.  The heart rate only increased from 80-100 beats per minute. He  Submaximal effort the peak Oxygen Consumption (VO2) was 68% which is moderately abnormal, however underestimated due to chronotropic incompetence.  PFT Spirometry results:  FVC --  2.75, 85%-normal FEV1 -- 1.49, 63%-low FEV1/FVC -- 54.2-low  [name], MD

## 2012-09-24 NOTE — Patient Instructions (Addendum)
Your physician wants you to follow-up in 6 months with extender and then see Dr Herbie Baltimore in 12 months.  You will receive a reminder letter in the mail two months in advance. If you don't receive a letter, please call our office to schedule the follow-up appt  Your physician has recommended you make the following change in your medication Metoprol succ 25 mg one tablet daily.   Discontinue Metoprol succ 50 mg.

## 2012-09-24 NOTE — Assessment & Plan Note (Signed)
Recent labs reviewed -- well controlled on statin.  F/u with PCP.

## 2012-09-24 NOTE — Assessment & Plan Note (Signed)
Well controlled on BB + ACE-I-HCTZ combination.  Will need to monitor for increased BP with BB dose cut in 1/2. Dropping Metoprolol Succ to 25 mg daily

## 2012-10-03 ENCOUNTER — Other Ambulatory Visit: Payer: Self-pay | Admitting: Emergency Medicine

## 2012-10-15 ENCOUNTER — Ambulatory Visit: Payer: Medicare Other

## 2012-10-15 ENCOUNTER — Ambulatory Visit (INDEPENDENT_AMBULATORY_CARE_PROVIDER_SITE_OTHER): Payer: Medicare Other | Admitting: Emergency Medicine

## 2012-10-15 VITALS — BP 122/70 | HR 66 | Temp 97.7°F | Resp 16 | Ht 68.5 in | Wt 198.8 lb

## 2012-10-15 DIAGNOSIS — M25571 Pain in right ankle and joints of right foot: Secondary | ICD-10-CM

## 2012-10-15 DIAGNOSIS — M25579 Pain in unspecified ankle and joints of unspecified foot: Secondary | ICD-10-CM

## 2012-10-15 LAB — POCT CBC
Granulocyte percent: 69.2 %G (ref 37–80)
HCT, POC: 45 % (ref 43.5–53.7)
Lymph, poc: 2.4 (ref 0.6–3.4)
MCHC: 31.8 g/dL (ref 31.8–35.4)
MID (cbc): 0.7 (ref 0–0.9)
MPV: 8.6 fL (ref 0–99.8)
POC Granulocyte: 7.1 — AB (ref 2–6.9)
POC LYMPH PERCENT: 23.5 %L (ref 10–50)
Platelet Count, POC: 201 10*3/uL (ref 142–424)

## 2012-10-15 LAB — URIC ACID: Uric Acid, Serum: 7.2 mg/dL (ref 4.0–7.8)

## 2012-10-15 MED ORDER — MELOXICAM 7.5 MG PO TABS: 7.5000 mg | ORAL_TABLET | Freq: Every day | ORAL | Status: DC

## 2012-10-15 NOTE — Progress Notes (Signed)
  Subjective:    Patient ID: Louis Bryan, male    DOB: October 22, 1935, 77 y.o.   MRN: [id number]  HPI since Friday patient has had pain in his right ankle. He has difficulty walking. His ankle still sore and he has been walking with a limp    Review of Systems     Objective:   Physical Exam there is tenderness both medial and lateral. There is puffiness to the joint. There is limited flexion and extension.  [facility] reading (PRIMARY) by  Dr. Cleta Alberts There is arthritic change no fracture we'll treat with Mobic 7.5 one a day for 2 weeks. Uric acid was done today since patient is  On hctz  Results for orders placed in visit on 10/15/12  POCT CBC      Result Value Range   WBC 10.2  4.6 - 10.2 K/uL   Lymph, poc 2.4  0.6 - 3.4   POC LYMPH PERCENT 23.5  10 - 50 %L   MID (cbc) 0.7  0 - 0.9   POC MID % 7.3  0 - 12 %M   POC Granulocyte 7.1 (*) 2 - 6.9   Granulocyte percent 69.2  37 - 80 %G   RBC 4.62 (*) 4.69 - 6.13 M/uL   Hemoglobin 14.3  14.1 - 18.1 g/dL   HCT, POC 08.6  57.8 - 53.7 %   MCV 97.4 (*) 80 - 97 fL   MCH, POC 31.0  27 - 31.2 pg   MCHC 31.8  31.8 - 35.4 g/dL   RDW, POC 46.9     Platelet Count, POC 201  142 - 424 K/uL   MPV 8.6  0 - 99.8 fL       Assessment & Plan:    mobic 7.5 qd. We'll check uric acid since patient is on a diuretic we'll also give a lace up support ankle brace.

## 2012-10-15 NOTE — Patient Instructions (Addendum)

## 2012-10-18 ENCOUNTER — Other Ambulatory Visit: Payer: Self-pay | Admitting: Emergency Medicine

## 2012-10-27 ENCOUNTER — Encounter (HOSPITAL_COMMUNITY)
Admission: EM | Disposition: A | Discharge status: Home / Self Care | Payer: Self-pay | Source: Home / Self Care | Attending: General Surgery

## 2012-10-27 ENCOUNTER — Ambulatory Visit (INDEPENDENT_AMBULATORY_CARE_PROVIDER_SITE_OTHER): Payer: Medicare Other | Admitting: Emergency Medicine

## 2012-10-27 ENCOUNTER — Encounter (HOSPITAL_COMMUNITY): Payer: Self-pay

## 2012-10-27 ENCOUNTER — Inpatient Hospital Stay (HOSPITAL_COMMUNITY)
Admission: EM | Admit: 2012-10-27 | Discharge: 2012-10-29 | DRG: 419 | Disposition: A | Discharge status: Home / Self Care | Payer: Medicare Other | Attending: General Surgery | Admitting: General Surgery

## 2012-10-27 ENCOUNTER — Ambulatory Visit: Payer: Medicare Other

## 2012-10-27 ENCOUNTER — Encounter (HOSPITAL_COMMUNITY): Payer: Self-pay | Admitting: Anesthesiology

## 2012-10-27 ENCOUNTER — Inpatient Hospital Stay (HOSPITAL_COMMUNITY): Payer: Medicare Other | Admitting: Anesthesiology

## 2012-10-27 ENCOUNTER — Emergency Department (HOSPITAL_COMMUNITY): Payer: Medicare Other

## 2012-10-27 ENCOUNTER — Encounter: Payer: Self-pay | Admitting: Emergency Medicine

## 2012-10-27 ENCOUNTER — Inpatient Hospital Stay (HOSPITAL_COMMUNITY): Payer: Medicare Other

## 2012-10-27 VITALS — BP 128/74 | HR 70 | Temp 98.2°F | Resp 16 | Ht 67.0 in | Wt 189.2 lb

## 2012-10-27 DIAGNOSIS — R1011 Right upper quadrant pain: Secondary | ICD-10-CM

## 2012-10-27 DIAGNOSIS — I10 Essential (primary) hypertension: Secondary | ICD-10-CM | POA: Diagnosis present

## 2012-10-27 DIAGNOSIS — R112 Nausea with vomiting, unspecified: Secondary | ICD-10-CM | POA: Diagnosis present

## 2012-10-27 DIAGNOSIS — E785 Hyperlipidemia, unspecified: Secondary | ICD-10-CM | POA: Diagnosis present

## 2012-10-27 DIAGNOSIS — K819 Cholecystitis, unspecified: Secondary | ICD-10-CM

## 2012-10-27 DIAGNOSIS — Z139 Encounter for screening, unspecified: Secondary | ICD-10-CM

## 2012-10-27 DIAGNOSIS — K810 Acute cholecystitis: Principal | ICD-10-CM | POA: Diagnosis present

## 2012-10-27 DIAGNOSIS — Z87891 Personal history of nicotine dependence: Secondary | ICD-10-CM

## 2012-10-27 DIAGNOSIS — K8000 Calculus of gallbladder with acute cholecystitis without obstruction: Secondary | ICD-10-CM

## 2012-10-27 HISTORY — PX: CHOLECYSTECTOMY: SHX55

## 2012-10-27 LAB — COMPREHENSIVE METABOLIC PANEL
ALT: 13 U/L (ref 0–53)
AST: 14 U/L (ref 0–37)
Alkaline Phosphatase: 94 U/L (ref 39–117)
Alkaline Phosphatase: 101 U/L (ref 39–117)
CO2: 24 mEq/L (ref 19–32)
CO2: 29 mEq/L (ref 19–32)
Calcium: 9.5 mg/dL (ref 8.4–10.5)
Chloride: 96 mEq/L (ref 96–112)
Creat: 1.14 mg/dL (ref 0.50–1.35)
GFR calc non Af Amer: 62 mL/min — ABNORMAL LOW (ref >90)
Glucose, Bld: 154 mg/dL — ABNORMAL HIGH (ref 70–99)
Potassium: 4.2 mEq/L (ref 3.5–5.1)
Sodium: 134 mEq/L — ABNORMAL LOW (ref 135–145)
Total Bilirubin: 1.6 mg/dL — ABNORMAL HIGH (ref 0.3–1.2)
Total Bilirubin: 1.3 mg/dL — ABNORMAL HIGH (ref 0.3–1.2)

## 2012-10-27 LAB — POCT URINALYSIS DIPSTICK
Leukocytes, UA: NEGATIVE
Nitrite, UA: NEGATIVE
Spec Grav, UA: 1.025
Urobilinogen, UA: 0.2

## 2012-10-27 LAB — POCT CBC
HCT, POC: 47.7 % (ref 43.5–53.7)
Hemoglobin: 15.6 g/dL (ref 14.1–18.1)
MID (cbc): 1.5 — AB (ref 0–0.9)
POC LYMPH PERCENT: 7.4 %L — AB (ref 10–50)
POC MID %: 6 %M (ref 0–12)
RBC: 4.96 M/uL (ref 4.69–6.13)
RDW, POC: 14.7 %

## 2012-10-27 LAB — POCT UA - MICROSCOPIC ONLY

## 2012-10-27 LAB — LIPASE: Lipase: 10 U/L (ref 0–75)

## 2012-10-27 SURGERY — LAPAROSCOPIC CHOLECYSTECTOMY WITH INTRAOPERATIVE CHOLANGIOGRAM
Anesthesia: General | Site: Abdomen | Wound class: Dirty or Infected

## 2012-10-27 MED ORDER — HYDROMORPHONE HCL PF 1 MG/ML IJ SOLN
0.2500 mg | INTRAMUSCULAR | Status: DC | PRN
  Administered 2012-10-27 (×2): 0.5 mg via INTRAVENOUS

## 2012-10-27 MED ORDER — PROPOFOL 10 MG/ML IV BOLUS
INTRAVENOUS | Status: DC | PRN
  Administered 2012-10-27 (×2): 100 mg via INTRAVENOUS

## 2012-10-27 MED ORDER — HYDROCHLOROTHIAZIDE 12.5 MG PO CAPS
12.5000 mg | ORAL_CAPSULE | Freq: Every day | ORAL | Status: DC
  Administered 2012-10-28: 12.5 mg via ORAL
  Filled 2012-10-27 (×3): qty 1

## 2012-10-27 MED ORDER — LABETALOL HCL 5 MG/ML IV SOLN
INTRAVENOUS | Status: DC | PRN
  Administered 2012-10-27: 15 mg via INTRAVENOUS

## 2012-10-27 MED ORDER — SODIUM CHLORIDE 0.9 % IV BOLUS (SEPSIS)
500.0000 mL | Freq: Once | INTRAVENOUS | Status: AC
  Administered 2012-10-27: 500 mL via INTRAVENOUS

## 2012-10-27 MED ORDER — BUPIVACAINE-EPINEPHRINE 0.25% -1:200000 IJ SOLN
INTRAMUSCULAR | Status: DC | PRN
  Administered 2012-10-27: 15 mL

## 2012-10-27 MED ORDER — MORPHINE SULFATE 4 MG/ML IJ SOLN
4.0000 mg | Freq: Once | INTRAMUSCULAR | Status: AC
  Administered 2012-10-27: 4 mg via INTRAVENOUS
  Filled 2012-10-27: qty 1

## 2012-10-27 MED ORDER — ONDANSETRON HCL 4 MG/2ML IJ SOLN
4.0000 mg | Freq: Once | INTRAMUSCULAR | Status: AC
  Administered 2012-10-27: 4 mg via INTRAVENOUS
  Filled 2012-10-27: qty 2

## 2012-10-27 MED ORDER — SODIUM CHLORIDE 0.9 % IV SOLN
INTRAVENOUS | Status: DC
  Administered 2012-10-27: 23:00:00 via INTRAVENOUS

## 2012-10-27 MED ORDER — LACTATED RINGERS IR SOLN
Status: DC | PRN
  Administered 2012-10-27 (×2): 1000 mL

## 2012-10-27 MED ORDER — LACTATED RINGERS IV SOLN
INTRAVENOUS | Status: DC
  Administered 2012-10-27: 20:00:00 via INTRAVENOUS

## 2012-10-27 MED ORDER — LACTATED RINGERS IV SOLN: INTRAVENOUS | Status: DC

## 2012-10-27 MED ORDER — IOHEXOL 300 MG/ML  SOLN
50.0000 mL | Freq: Once | INTRAMUSCULAR | Status: AC | PRN
  Administered 2012-10-27: 50 mL via ORAL

## 2012-10-27 MED ORDER — ONDANSETRON HCL 4 MG/2ML IJ SOLN
INTRAMUSCULAR | Status: DC | PRN
  Administered 2012-10-27: 4 mg via INTRAVENOUS

## 2012-10-27 MED ORDER — LACTATED RINGERS IV SOLN
INTRAVENOUS | Status: DC | PRN
  Administered 2012-10-27: 18:00:00 via INTRAVENOUS

## 2012-10-27 MED ORDER — ONDANSETRON HCL 4 MG/2ML IJ SOLN: 4.0000 mg | Freq: Four times a day (QID) | INTRAMUSCULAR | Status: DC | PRN

## 2012-10-27 MED ORDER — NEOSTIGMINE METHYLSULFATE 1 MG/ML IJ SOLN
INTRAMUSCULAR | Status: DC | PRN
  Administered 2012-10-27: 4 mg via INTRAVENOUS

## 2012-10-27 MED ORDER — HYDROCODONE-ACETAMINOPHEN 5-325 MG PO TABS: 1.0000 | ORAL_TABLET | ORAL | Status: DC | PRN

## 2012-10-27 MED ORDER — HYDROMORPHONE HCL PF 1 MG/ML IJ SOLN
INTRAMUSCULAR | Status: AC
  Filled 2012-10-27: qty 1

## 2012-10-27 MED ORDER — IOHEXOL 300 MG/ML  SOLN
100.0000 mL | Freq: Once | INTRAMUSCULAR | Status: AC | PRN
  Administered 2012-10-27: 100 mL via INTRAVENOUS

## 2012-10-27 MED ORDER — MORPHINE SULFATE 2 MG/ML IJ SOLN
2.0000 mg | INTRAMUSCULAR | Status: DC | PRN
  Administered 2012-10-27 – 2012-10-28 (×2): 2 mg via INTRAVENOUS
  Filled 2012-10-27 (×2): qty 1

## 2012-10-27 MED ORDER — ENOXAPARIN SODIUM 40 MG/0.4ML ~~LOC~~ SOLN
40.0000 mg | SUBCUTANEOUS | Status: DC
  Administered 2012-10-28 – 2012-10-29 (×2): 40 mg via SUBCUTANEOUS
  Filled 2012-10-27 (×3): qty 0.4

## 2012-10-27 MED ORDER — FENTANYL CITRATE 0.05 MG/ML IJ SOLN
INTRAMUSCULAR | Status: DC | PRN
  Administered 2012-10-27: 100 ug via INTRAVENOUS
  Administered 2012-10-27 (×3): 50 ug via INTRAVENOUS

## 2012-10-27 MED ORDER — LISINOPRIL 10 MG PO TABS
10.0000 mg | ORAL_TABLET | Freq: Every day | ORAL | Status: DC
  Administered 2012-10-28: 10 mg via ORAL
  Filled 2012-10-27 (×3): qty 1

## 2012-10-27 MED ORDER — DEXTROSE 5 % IV SOLN
2.0000 g | Freq: Four times a day (QID) | INTRAVENOUS | Status: DC
  Administered 2012-10-27 – 2012-10-28 (×2): 2 g via INTRAVENOUS
  Filled 2012-10-27 (×3): qty 2

## 2012-10-27 MED ORDER — IOHEXOL 300 MG/ML  SOLN
INTRAMUSCULAR | Status: AC
  Filled 2012-10-27: qty 1

## 2012-10-27 MED ORDER — CEFOXITIN SODIUM-DEXTROSE 1-4 GM-% IV SOLR (PREMIX)
INTRAVENOUS | Status: AC
  Filled 2012-10-27: qty 100

## 2012-10-27 MED ORDER — SUCCINYLCHOLINE CHLORIDE 20 MG/ML IJ SOLN
INTRAMUSCULAR | Status: DC | PRN
  Administered 2012-10-27: 100 mg via INTRAVENOUS

## 2012-10-27 MED ORDER — DOCUSATE SODIUM 100 MG PO CAPS
100.0000 mg | ORAL_CAPSULE | Freq: Two times a day (BID) | ORAL | Status: DC
  Filled 2012-10-27 (×3): qty 1

## 2012-10-27 MED ORDER — SIMVASTATIN 20 MG PO TABS
20.0000 mg | ORAL_TABLET | Freq: Every day | ORAL | Status: DC
  Filled 2012-10-27 (×2): qty 1

## 2012-10-27 MED ORDER — GLYCOPYRROLATE 0.2 MG/ML IJ SOLN
INTRAMUSCULAR | Status: DC | PRN
  Administered 2012-10-27: .6 mg via INTRAVENOUS

## 2012-10-27 MED ORDER — DEXTROSE 5 % IV SOLN
2.0000 g | INTRAVENOUS | Status: AC
  Administered 2012-10-27: 2 g via INTRAVENOUS

## 2012-10-27 MED ORDER — ONDANSETRON HCL 4 MG PO TABS: 4.0000 mg | ORAL_TABLET | Freq: Four times a day (QID) | ORAL | Status: DC | PRN

## 2012-10-27 MED ORDER — IOHEXOL 300 MG/ML  SOLN
INTRAMUSCULAR | Status: DC | PRN
  Administered 2012-10-27: 20 mL via INTRAVENOUS

## 2012-10-27 MED ORDER — BUPIVACAINE-EPINEPHRINE PF 0.25-1:200000 % IJ SOLN
INTRAMUSCULAR | Status: AC
  Filled 2012-10-27: qty 30

## 2012-10-27 MED ORDER — METOPROLOL SUCCINATE ER 25 MG PO TB24
25.0000 mg | ORAL_TABLET | Freq: Every day | ORAL | Status: DC
  Administered 2012-10-28: 25 mg via ORAL
  Filled 2012-10-27 (×3): qty 1

## 2012-10-27 MED ORDER — ROCURONIUM BROMIDE 100 MG/10ML IV SOLN
INTRAVENOUS | Status: DC | PRN
  Administered 2012-10-27: 30 mg via INTRAVENOUS
  Administered 2012-10-27 (×2): 10 mg via INTRAVENOUS

## 2012-10-27 SURGICAL SUPPLY — 36 items
APPLIER CLIP 5 13 M/L LIGAMAX5 (MISCELLANEOUS) ×2
APR CLP MED LRG 5 ANG JAW (MISCELLANEOUS) ×1
BAG SPEC RTRVL LRG 6X4 10 (ENDOMECHANICALS) ×1
CABLE HIGH FREQUENCY MONO STRZ (ELECTRODE) ×2 IMPLANT
CANISTER SUCTION 2500CC (MISCELLANEOUS) ×2 IMPLANT
CHLORAPREP W/TINT 26ML (MISCELLANEOUS) ×2 IMPLANT
CLIP APPLIE 5 13 M/L LIGAMAX5 (MISCELLANEOUS) ×1 IMPLANT
CLOTH BEACON ORANGE TIMEOUT ST (SAFETY) ×2 IMPLANT
COVER MAYO STAND STRL (DRAPES) IMPLANT
COVER SURGICAL LIGHT HANDLE (MISCELLANEOUS) ×4 IMPLANT
DRAIN CHANNEL 19F RND (DRAIN) ×1 IMPLANT
DRAPE C-ARM 42X120 X-RAY (DRAPES) IMPLANT
DRAPE LAPAROSCOPIC ABDOMINAL (DRAPES) ×2 IMPLANT
DRAPE UTILITY W/TAPE 26X15 (DRAPES) ×2 IMPLANT
DRAPE UTILITY XL STRL (DRAPES) ×2 IMPLANT
ELECT REM PT RETURN 9FT ADLT (ELECTROSURGICAL) ×2
ELECTRODE REM PT RTRN 9FT ADLT (ELECTROSURGICAL) ×1 IMPLANT
GLOVE BIOGEL PI IND STRL 7.0 (GLOVE) ×1 IMPLANT
GLOVE BIOGEL PI INDICATOR 7.0 (GLOVE) ×1
GOWN BRE IMP PREV XXLGXLNG (GOWN DISPOSABLE) ×2 IMPLANT
GOWN STRL REIN XL XLG (GOWN DISPOSABLE) ×4 IMPLANT
KIT BASIN OR (CUSTOM PROCEDURE TRAY) ×2 IMPLANT
NS IRRIG 1000ML POUR BTL (IV SOLUTION) ×2 IMPLANT
POUCH SPECIMEN RETRIEVAL 10MM (ENDOMECHANICALS) ×2 IMPLANT
SCISSORS ENDO CVD 5DCS (MISCELLANEOUS) ×2 IMPLANT
SET CHOLANGIOGRAPH MIX (MISCELLANEOUS) ×2 IMPLANT
SOLUTION ANTI FOG 6CC (MISCELLANEOUS) ×2 IMPLANT
SPONGE GAUZE 4X4 12PLY (GAUZE/BANDAGES/DRESSINGS) ×1 IMPLANT
SUT ETHILON 3 0 PS 1 (SUTURE) ×1 IMPLANT
SUT VICRYL RAPIDE 4/0 PS 2 (SUTURE) ×2 IMPLANT
TAPE CLOTH 4X10 WHT NS (GAUZE/BANDAGES/DRESSINGS) ×1 IMPLANT
TOWEL OR 17X26 10 PK STRL BLUE (TOWEL DISPOSABLE) ×2 IMPLANT
TRAY LAP CHOLE (CUSTOM PROCEDURE TRAY) ×2 IMPLANT
TROCAR BLADELESS OPT 5 75 (ENDOMECHANICALS) ×2 IMPLANT
TROCAR SLEEVE XCEL 5X75 (ENDOMECHANICALS) ×4 IMPLANT
TROCAR XCEL BLUNT TIP 100MML (ENDOMECHANICALS) ×2 IMPLANT

## 2012-10-27 NOTE — Preoperative (Signed)
Beta Blockers   Reason not to administer Beta Blockers:Not Applicable 

## 2012-10-27 NOTE — ED Notes (Signed)
Diane RN OR given brief report

## 2012-10-27 NOTE — Patient Instructions (Signed)
Please go to the ER was that long she can register started on IV fluids and had a CT abdomen and pelvis to rule out appendicitis rule out abscess.

## 2012-10-27 NOTE — H&P (Signed)
Louis Bryan is an 77 y.o. male.   Chief Complaint: RUQ pain HPI: The patient states the pain started on Tues in his RUQ.  This was accompanied by nausea and vomiting.  He then felt better except for some diarrhea.  The pain then returned last night, which brought him to urgent care.  His WBC was found to be elevated and a CT was performed.  This showed pericholecystic fluid.  An US showed mildly dilated CBD and signs of cholecystitis.  His bilirubin was mildly elevated as well.    Past Medical History  Diagnosis Date  . Hypertension   . Hyperlipidemia   . Shortness of breath     On exertion  . Tobacco abuse   . History of exercise stress test [phone]    negative bruce protocol excercise stress test with scintigraphic evidence of diaphragmatic attenuation and mild apical thinning, dynamic gating was not performed secondary to frequent ectopy, low risk study    History reviewed. No pertinent past surgical history.  History reviewed. No pertinent family history. Social History:  reports that he quit smoking about 20 years ago. He has never used smokeless tobacco. He reports that he does not drink alcohol or use illicit drugs.  Allergies: No Known Allergies   (Not in a hospital admission)  Results for orders placed during the hospital encounter of 10/27/12 (from the past 48 hour(s))  COMPREHENSIVE METABOLIC PANEL     Status: Abnormal   Collection Time    10/27/12 12:08 PM      Result Value Range   Sodium 134 (*) 135 - 145 mEq/L   Potassium 4.2  3.5 - 5.1 mEq/L   Chloride 96  96 - 112 mEq/L   CO2 29  19 - 32 mEq/L   Glucose, Bld 126 (*) 70 - 99 mg/dL   BUN 16  6 - 23 mg/dL   Creatinine, Ser 1.47  0.50 - 1.35 mg/dL   Calcium 9.5  8.4 - 82.9 mg/dL   Total Protein 8.5 (*) 6.0 - 8.3 g/dL   Albumin 3.3 (*) 3.5 - 5.2 g/dL   AST 14  0 - 37 U/L   ALT 13  0 - 53 U/L   Alkaline Phosphatase 101  39 - 117 U/L   Total Bilirubin 1.3 (*) 0.3 - 1.2 mg/dL   GFR calc non Af Amer 62 (*) >90 mL/min    GFR calc Af Amer 71 (*) >90 mL/min   Comment:            The eGFR has been calculated     using the CKD EPI equation.     This calculation has not been     validated in all clinical     situations.     eGFR's persistently     <90 mL/min signify     possible Chronic Kidney Disease.  LIPASE, BLOOD     Status: None   Collection Time    10/27/12 12:08 PM      Result Value Range   Lipase 21  11 - 59 U/L   US Abdomen Complete  10/27/2012   *RADIOLOGY REPORT*  Clinical Data:  Cholecystitis on the CT.  Abdominal pain and elevated white blood cell count  COMPLETE ABDOMINAL ULTRASOUND  Comparison:  CT 10/27/2012  Findings:  Gallbladder:  The gallbladder wall is thickened to 5 mm.  There is pericholecystic fluid present.  There multiple gallstones in the gallbladder.  Negative sonographic Murphy's sign.  Common bile duct:  The dilated at 7 mm.  Liver:  There is mild intrahepatic biliary ductal dilatation. There is several cystic lesions within the liver as demonstrated on comparison ultrasound.  IVC:  Appears normal.  Pancreas:  No focal abnormality seen.  Spleen:  Normal in size and echogenicity.  Right Kidney:  13.08cm in length.  No hydronephrosis.  Simple cysts as demonstrated on comparison CT  Left Kidney:  12.2cm in length.  No evidence of hydronephrosis or stones.  Abdominal aorta:  No aneurysm identified.  IMPRESSION:  1.  Cholelithiasis with gallbladder wall thickening and pericholecystic fluid is most consistent with acute cholecystitis. Negative sonographic Murphy's sign. 2.  Dilatation of the common bile duct to 7 mm. 3.  Mild intrahepatic biliary duct dilatation.   Original Report Authenticated By: Genevive Bi, M.D.   Ct Abdomen Pelvis W Contrast  10/27/2012   *RADIOLOGY REPORT*  Clinical Data: Right-sided abdominal pain.  CT ABDOMEN AND PELVIS WITH CONTRAST  Technique:  Multidetector CT imaging of the abdomen and pelvis was performed following the standard protocol during bolus  administration of intravenous contrast.  Contrast: OMNIPAQUE IOHEXOL 300 MG/ML  SOLN  Comparison: Acute abdominal series earlier today.  Findings: There is evidence of acute cholecystitis.  The gallbladder contains multiple calcified gallstones and is distended.  There are diffuse inflammatory changes in the fat surrounding the gallbladder.  No focal abscess is identified.  There is some associated biliary dilatation with the common bile duct demonstrating maximal diameter of 11 mm.  No definite calculi are seen within the common bile duct.  There is no significant intrahepatic biliary ductal dilatation.  The liver itself contains several small cysts which are benign in appearance.  The pancreas, spleen, adrenal glands and kidneys are unremarkable. Benign appearing bilateral renal cysts are present.  Bowel shows no evidence of dilatation or obstruction.  Focal segment of the colon at the superior aspect of the hepatic flexure does show mild thickening where it abuts the inflamed gallbladder.  There is no evidence of free air or ascites.  Diverticulosis of the sigmoid colon present in the pelvis without evidence of diverticulitis.  There is a small left inguinal hernia containing fat.  The bladder is unremarkable.  Bony structures are unremarkable.  IMPRESSION: Evidence of cholelithiasis and acute cholecystitis.  The common bile duct is also dilated, measuring up to 11 mm in greatest caliber.  No definite choledocholithiasis is identified by CT. Mild focal colonic wall thickening is present where the inflamed gallbladder abuts the region of the hepatic flexure of the colon.   Original Report Authenticated By: Irish Lack, M.D.   Dg Abd Acute W/chest  10/27/2012   *RADIOLOGY REPORT*  Clinical Data: Abdominal pain.  ACUTE ABDOMEN SERIES (ABDOMEN 2 VIEW & CHEST 1 VIEW)  Comparison: Abdominal film on 03/21/2006 performed at [facility].  Findings: The chest shows probable component of COPD.  There is  scarring at the right lung base.  The heart size is normal.  No edema or infiltrates are seen.  Abdominal films show no evidence of bowel obstruction or ileus.  No free air is identified.  No abnormal calcifications are seen. Visualized bony structures are unremarkable.  IMPRESSION: No acute findings.  Probable component of COPD.  Clinically significant discrepancy from primary report, if provided: None   Original Report Authenticated By: Irish Lack, M.D.    Review of Systems  Constitutional: Negative for fever, chills and weight loss.  Eyes: Negative for blurred vision.  Respiratory: Negative for cough  and shortness of breath.   Cardiovascular: Negative for chest pain.  Gastrointestinal: Positive for nausea, vomiting, abdominal pain and diarrhea. Negative for constipation and blood in stool.  Genitourinary: Positive for dysuria.  Neurological: Negative for dizziness and headaches.    Blood pressure 136/82, pulse 117, temperature 101.1 F (38.4 C), temperature source Oral, resp. rate 16, height 5\' 7"  (1.702 m), weight 189 lb (85.73 kg), SpO2 93.00%. Physical Exam  Constitutional: He is oriented to person, place, and time. He appears well-developed and well-nourished.  HENT:  Head: Normocephalic and atraumatic.  Eyes: Conjunctivae are normal. Pupils are equal, round, and reactive to light.  Neck: Normal range of motion.  Cardiovascular: Normal rate and regular rhythm.   Respiratory: Effort normal and breath sounds normal. No respiratory distress.  GI: Soft. He exhibits no distension. There is tenderness (mild RUQ). There is no rebound and no guarding.  Neurological: He is alert and oriented to person, place, and time.  Skin: Skin is warm and dry.     Assessment/Plan Hassell Patras is a 77 y.o. M with RUQ pain since Tues.  On exam he is mildly tender in RUQ.  His imaging is consistent with cholecystitis.  Given his elevated wbc, temperature and heart rate, I think he would benefit from an  emergent cholecystectomy.  I will start cefoxitin as well.   The anatomy & physiology of hepatobiliary & pancreatic function was discussed.  The pathophysiology of gallbladder dysfunction was discussed.  Natural history risks without surgery was discussed.   I feel the risks of no intervention will lead to serious problems that outweigh the operative risks; therefore, I recommended cholecystectomy to remove the pathology.  I explained laparoscopic techniques with possible need for an open approach.  Probable cholangiogram to evaluate the bilary tract was explained as well.    Risks such as bleeding, infection, abscess, leak, injury to other organs, need for further treatment, heart attack, death, and other risks were discussed.  I noted a good likelihood this will help address the problem.  Possibility that this will not correct all abdominal symptoms was explained.  Goals of post-operative recovery were discussed as well.  We will work to minimize complications.  An educational handout further explaining the pathology and treatment options was given as well.  Questions were answered.  The patient expresses understanding & wishes to proceed with surgery.  Shaquinta Peruski C. 10/27/2012, 4:58 PM

## 2012-10-27 NOTE — Transfer of Care (Signed)
Immediate Anesthesia Transfer of Care Note  Patient: Louis Bryan  Procedure(s) Performed: Procedure(s): LAPAROSCOPIC CHOLECYSTECTOMY WITH INTRAOPERATIVE CHOLANGIOGRAM (N/A)  Patient Location: PACU  Anesthesia Type:General  Level of Consciousness: awake, alert  and oriented  Airway & Oxygen Therapy: Patient Spontanous Breathing and Patient connected to face mask oxygen  Post-op Assessment: Report given to PACU RN and Post -op Vital signs reviewed and stable  Post vital signs: Reviewed and stable  Complications: No apparent anesthesia complications

## 2012-10-27 NOTE — Progress Notes (Signed)
Subjective:    Patient ID: Louis Bryan, male    DOB: 05/17/1935, 77 y.o.   MRN: [id number]  HPI patient seen 10 days ago and treated with Mobic 7.5 one a day for ankle pain. Her TSH was borderline high. On Tuesday he started having right upper and  mid abdominal pain. He had vomiting on Tuesday and felt some better on Wednesday yesterday and Friday the pain would come and go. He continues to hurt on the right side he does not have any heartburn acid he has not vomited up any blood he took a laxative and has had a loose stool. He denies any urinary symptoms of burning or discomfort.    Review of Systems     Objective:   Physical Exam patient is alert and cooperative in no distress. His neck is supple. His chest exam was clear. Cardiac exam revealed no murmurs or gallops. The abdomen was slightly distended bowel sounds are present there is a fullness with tenderness and guarding in the right upper and mid abdomen. Rectal exam reveals a normal prostate brown stool. [facility] reading (PRIMARY) by  Dr. Cleta Alberts there is no free air seen there are isolated air-fluid levels in right lower abdomen  Results for orders placed in visit on 10/27/12  IFOBT (OCCULT BLOOD)      Result Value Range   IFOBT Negative    POCT URINALYSIS DIPSTICK      Result Value Range   Color, UA dk yellow     Clarity, UA clear     Glucose, UA neg     Bilirubin, UA small     Ketones, UA trace     Spec Grav, UA 1.025     Blood, UA mod     pH, UA 6.5     Protein, UA 100     Urobilinogen, UA 0.2     Nitrite, UA neg     Leukocytes, UA Negative    POCT UA - MICROSCOPIC ONLY      Result Value Range   WBC, Ur, HPF, POC 1-2     RBC, urine, microscopic 4-6     Bacteria, U Microscopic neg     Mucus, UA trace     Epithelial cells, urine per micros neg     Crystals, Ur, HPF, POC neg     Casts, Ur, LPF, POC neg     Yeast, UA neg    POCT CBC      Result Value Range   WBC 25.8 (*) 4.6 - 10.2 K/uL   Lymph, poc 1.9  0.6 - 3.4   POC  LYMPH PERCENT 7.4 (*) 10 - 50 %L   MID (cbc) 1.5 (*) 0 - 0.9   POC MID % 6.0  0 - 12 %M   POC Granulocyte 22.3 (*) 2 - 6.9   Granulocyte percent 86.6 (*) 37 - 80 %G   RBC 4.96  4.69 - 6.13 M/uL   Hemoglobin 15.6  14.1 - 18.1 g/dL   HCT, POC 40.9  81.1 - 53.7 %   MCV 96.1  80 - 97 fL   MCH, POC 31.5 (*) 27 - 31.2 pg   MCHC 32.7  31.8 - 35.4 g/dL   RDW, POC 91.4     Platelet Count, POC 245  142 - 424 K/uL   MPV 9.1  0 - 99.8 fL        Assessment & Plan:  Patient presents with abdominal pain nausea and vomiting. He has been  on Mobic so certainly could have a gastritis or ulcer. He is pretty tender with guarding in the right upper abdomen this also could be either gallbladder disease or appendicitis white count 25,000 makes this very suspicious for acute cholecystitis, acute appendicitis, or diverticular abscess.Marland Kitchen

## 2012-10-27 NOTE — Anesthesia Preprocedure Evaluation (Signed)
Anesthesia Evaluation  Patient identified by MRN, date of birth, ID band Patient awake    Reviewed: Allergy & Precautions, H&P , NPO status , Patient's Chart, lab work & pertinent test results  Airway Mallampati: II TM Distance: >3 FB Neck ROM: Full    Dental  (+) Teeth Intact, Poor Dentition, Missing, Loose and Dental Advisory Given   Pulmonary shortness of breath and with exertion, former smoker,  breath sounds clear to auscultation  Pulmonary exam normal       Cardiovascular hypertension, negative cardio ROS  Rhythm:Regular Rate:Normal     Neuro/Psych negative neurological ROS  negative psych ROS   GI/Hepatic negative GI ROS, Neg liver ROS,   Endo/Other  negative endocrine ROS  Renal/GU negative Renal ROS  negative genitourinary   Musculoskeletal negative musculoskeletal ROS (+)   Abdominal   Peds negative pediatric ROS (+)  Hematology negative hematology ROS (+)   Anesthesia Other Findings   Reproductive/Obstetrics                           Anesthesia Physical Anesthesia Plan  ASA: II and emergent  Anesthesia Plan: General   Post-op Pain Management:    Induction: Intravenous  Airway Management Planned: Oral ETT  Additional Equipment:   Intra-op Plan:   Post-operative Plan: Extubation in OR  Informed Consent: I have reviewed the patients History and Physical, chart, labs and discussed the procedure including the risks, benefits and alternatives for the proposed anesthesia with the patient or authorized representative who has indicated his/her understanding and acceptance.   Dental advisory given  Plan Discussed with: CRNA  Anesthesia Plan Comments:         Anesthesia Quick Evaluation

## 2012-10-27 NOTE — ED Notes (Signed)
Key to valuable box in security taken to OR and placed with chart by [employer]. Personal valuables with sister.

## 2012-10-27 NOTE — Op Note (Signed)
10/27/2012  7:41 PM  PATIENT:  Louis Bryan  77 y.o. male  Patient Care Team: Collene Gobble, MD as PCP - General (Family Medicine)  PRE-OPERATIVE DIAGNOSIS:  Acute cholecystitis  POST-OPERATIVE DIAGNOSIS:  Gangrenous cholecystitis  PROCEDURE:  Procedure(s): LAPAROSCOPIC CHOLECYSTECTOMY WITH INTRAOPERATIVE CHOLANGIOGRAM  SURGEON:  Surgeon(s): Romie Levee, MD Atilano Ina, MD  ASSISTANT: Andrey Campanile   ANESTHESIA:   general  EBL:     DRAINS: (3F) Jackson-Pratt drain(s) with closed bulb suction in the RUQ   SPECIMEN:  Source of Specimen:  Gallbladder  DISPOSITION OF SPECIMEN:  PATHOLOGY  COUNTS:  YES  PLAN OF CARE: admit to inpatient  PATIENT DISPOSITION:  PACU - hemodynamically stable.  INDICATION: This is a 77 y.o. M with RUQ pain who was found to have cholecystitis on [place].  His HR and WBC were elevated and he was taken emergently to OR for resection.    The anatomy & physiology of hepatobiliary & pancreatic function was discussed.  The pathophysiology of gallbladder dysfunction was discussed.  Natural history risks without surgery was discussed.   I feel the risks of no intervention will lead to serious problems that outweigh the operative risks; therefore, I recommended cholecystectomy to remove the pathology.  I explained laparoscopic techniques with possible need for an open approach.  Probable cholangiogram to evaluate the bilary tract was explained as well.    Risks such as bleeding, infection, abscess, leak, injury to other organs, need for further treatment, heart attack, death, and other risks were discussed.  I noted a good likelihood this will help address the problem.  Possibility that this will not correct all abdominal symptoms was explained.  Goals of post-operative recovery were discussed as well.    OR FINDINGS: Gangrenous gallbladder  DESCRIPTION:   The patient was identified & brought into the operating room. The patient was positioned supine with arms out.  SCDs were active during the entire case. The patient underwent general anesthesia without any difficulty.  The abdomen was prepped and draped in a sterile fashion. A Surgical Timeout was performed and confirmed our plan.  We positioned the patient in reverse [place] & right side up.  I placed a Hassan laparoscopic port through the umbilicus using open entry technique.  Entry was clean. There were no adhesions to the anterior abdominal wall supraumbilically.  We induced carbon dioxide insufflation. Camera inspection revealed no injury.   I proceeded to continue with laparoscopic technique. I placed a #5 port in mid subcostal region, another 5mm port in the right flank near the anterior axillary line, and a 5mm port in the left subxiphoid region obliquely within the falciform ligament.  I turned attention to the right upper quadrant.  The gallbladder fundus was necrotic.  This was aspirated.  The gallbladder body was elevated cephalad. I used blunt dissection to free the peritoneal coverings between the gallbladder and the liver on the posteriolateral and anteriomedial walls.   I used careful blunt dissection to help get a good critical view of the cystic duct.  I did further dissection to free a few centimeters of the  gallbladder off the liver bed to get a good critical view of the infundibulum and cystic duct. I mobilized the cystic artery.  I skeletonized the cystic duct.  After getting a good 360 view, I decided to perform a cholangiogram.  I placed a clip on the infundibulum.   I did a partial cystic duct-otomy and ensured patency. I placed a 5 [place] through a  puncture site at the right subcostal ridge of the abdominal wall and directed it into the cystic duct.  We ran a cholangiogram with dilute radio-opaque contrast and continuous fluoroscopy.  Contrast flowed from a side branch consistent with cystic duct cannulization. Contrast flowed up the common hepatic duct into the right  and left intrahepatic chains out to secondary radicals. Contrast flowed down the common bile duct easily across the normal ampulla into the duodenum.  This was consistent with a normal cholangiogram.  I removed the cholangiocatheter.  I placed clips on the cystic duct x3.  I completed cystic duct transection.   I placed clips on the cystic artery x3 with 2 proximally.  I ligated the cystic artery using scissors. I freed the gallbladder from its remaining attachments to the liver. I ensured hemostasis on the gallbladder fossa of the liver and elsewhere. I inspected the rest of the abdomen & detected no injury nor bleeding elsewhere.  I irrigated the RUQ with normal saline.  I removed the gallbladder through the umbilical port site.  I placed a 55F blake drain in the subhepatic space and brought it out through the RUQ port site.  This was secured with 2-0 Nylon suture.  I closed the umbilical fascia using 0 Vicryl stitches.   I closed the skin using 4-0 vicryl stitch.  Sterile dressings were applied. The patient was extubated & arrived in the PACU in stable condition.  I had discussed postoperative care with the patient in the holding area.   I will discuss  operative findings and postoperative goals / instructions with the patient's family.  Instructions are written in the chart as well.

## 2012-10-27 NOTE — ED Notes (Signed)
He c/o right-sided abd. Pain since this Tues.  Saw Dr. Cleta Alberts for this today, and he sent him here for eval.  He is alert and oriented x 4 and in no distress.

## 2012-10-27 NOTE — ED Provider Notes (Signed)
History     CSN: [id number]  Arrival date & time 10/27/12  1027   First MD Initiated Contact with Patient 10/27/12 1036      Chief Complaint  Patient presents with  . Abdominal Pain    (Consider location/radiation/quality/duration/timing/severity/associated sxs/prior treatment) Patient is a 77 y.o. male presenting with abdominal pain.  Abdominal Pain Associated symptoms include abdominal pain. Pertinent negatives include no chest pain, no headaches and no shortness of breath.   patient's had abdominal pain for last few days. Is on the right side. No nausea vomiting. No fevers. No change in his bowel habits. No dysuria. Was seen at primary care Dr. and had a white count of 25,000. He was sent in for further evaluation. Pain was on the right side it is constant. It is worse with movement. He has not had a change in his appetite.  Past Medical History  Diagnosis Date  . Hypertension   . Hyperlipidemia   . Shortness of breath     On exertion  . Tobacco abuse   . History of exercise stress test [phone]    negative bruce protocol excercise stress test with scintigraphic evidence of diaphragmatic attenuation and mild apical thinning, dynamic gating was not performed secondary to frequent ectopy, low risk study    History reviewed. No pertinent past surgical history.  History reviewed. No pertinent family history.  History  Substance Use Topics  . Smoking status: Former Smoker    Quit date: 06/13/1992  . Smokeless tobacco: Never Used  . Alcohol Use: No      Review of Systems  Constitutional: Negative for activity change and appetite change.  HENT: Negative for neck stiffness.   Eyes: Negative for pain.  Respiratory: Negative for chest tightness and shortness of breath.   Cardiovascular: Negative for chest pain and leg swelling.  Gastrointestinal: Positive for abdominal pain. Negative for nausea, vomiting and diarrhea.  Genitourinary: Negative for flank pain.   Musculoskeletal: Negative for back pain.  Skin: Negative for rash.  Neurological: Negative for weakness, numbness and headaches.  Psychiatric/Behavioral: Negative for behavioral problems.    Allergies  Review of patient's allergies indicates no known allergies.  Home Medications   Current Outpatient Rx  Name  Route  Sig  Dispense  Refill  . aspirin 81 MG tablet   Oral   Take 81 mg by mouth daily.         Marland Kitchen docusate sodium (COLACE) 50 MG capsule   Oral   Take by mouth daily. Patient takes OTC stool softener in addition to colace tablet.         Marland Kitchen lisinopril-hydrochlorothiazide (PRINZIDE,ZESTORETIC) 10-12.5 MG per tablet      TAKE ONE TABLET BY MOUTH ONE TIME DAILY   90 tablet   1   . meloxicam (MOBIC) 7.5 MG tablet   Oral   Take 1 tablet (7.5 mg total) by mouth daily.   14 tablet   0   . metoprolol succinate (TOPROL-XL) 25 MG 24 hr tablet   Oral   Take 1 tablet (25 mg total) by mouth daily. Take with or immediately following a meal.   30 tablet   11   . simvastatin (ZOCOR) 20 MG tablet      TAKE ONE TABLET BY MOUTH IN THE EVENING   30 tablet   2     BP 128/83  Pulse 111  Temp(Src) 99.5 F (37.5 C) (Oral)  Resp 16  Ht 5\' 7"  (1.702 m)  Wt 189 lb (  85.73 kg)  BMI 29.59 kg/m2  SpO2 93%  Physical Exam  Nursing note and vitals reviewed. Constitutional: He is oriented to person, place, and time. He appears well-developed and well-nourished.  HENT:  Head: Normocephalic and atraumatic.  Eyes: EOM are normal. Pupils are equal, round, and reactive to light.  Neck: Normal range of motion. Neck supple.  Cardiovascular: Normal rate, regular rhythm and normal heart sounds.   No murmur heard. Pulmonary/Chest: Effort normal and breath sounds normal.  Abdominal: Soft. Bowel sounds are normal. He exhibits no distension and no mass. There is tenderness. There is no rebound and no guarding.  Right-sided abdominal tenderness. Worse midabdomen. No hernias palpated.   Musculoskeletal: Normal range of motion. He exhibits no edema.  Neurological: He is alert and oriented to person, place, and time. No cranial nerve deficit.  Skin: Skin is warm and dry.  Psychiatric: He has a normal mood and affect.    ED Course  Procedures (including critical care time)  Labs Reviewed  COMPREHENSIVE METABOLIC PANEL - Abnormal; Notable for the following:    Sodium 134 (*)    Glucose, Bld 126 (*)    Total Protein 8.5 (*)    Albumin 3.3 (*)    Total Bilirubin 1.3 (*)    GFR calc non Af Amer 62 (*)    GFR calc Af Amer 71 (*)    All other components within normal limits  LIPASE, BLOOD   Ct Abdomen Pelvis W Contrast  10/27/2012   *RADIOLOGY REPORT*  Clinical Data: Right-sided abdominal pain.  CT ABDOMEN AND PELVIS WITH CONTRAST  Technique:  Multidetector CT imaging of the abdomen and pelvis was performed following the standard protocol during bolus administration of intravenous contrast.  Contrast: OMNIPAQUE IOHEXOL 300 MG/ML  SOLN  Comparison: Acute abdominal series earlier today.  Findings: There is evidence of acute cholecystitis.  The gallbladder contains multiple calcified gallstones and is distended.  There are diffuse inflammatory changes in the fat surrounding the gallbladder.  No focal abscess is identified.  There is some associated biliary dilatation with the common bile duct demonstrating maximal diameter of 11 mm.  No definite calculi are seen within the common bile duct.  There is no significant intrahepatic biliary ductal dilatation.  The liver itself contains several small cysts which are benign in appearance.  The pancreas, spleen, adrenal glands and kidneys are unremarkable. Benign appearing bilateral renal cysts are present.  Bowel shows no evidence of dilatation or obstruction.  Focal segment of the colon at the superior aspect of the hepatic flexure does show mild thickening where it abuts the inflamed gallbladder.  There is no evidence of free air or  ascites.  Diverticulosis of the sigmoid colon present in the pelvis without evidence of diverticulitis.  There is a small left inguinal hernia containing fat.  The bladder is unremarkable.  Bony structures are unremarkable.  IMPRESSION: Evidence of cholelithiasis and acute cholecystitis.  The common bile duct is also dilated, measuring up to 11 mm in greatest caliber.  No definite choledocholithiasis is identified by CT. Mild focal colonic wall thickening is present where the inflamed gallbladder abuts the region of the hepatic flexure of the colon.   Original Report Authenticated By: Irish Lack, M.D.   Dg Abd Acute W/chest  10/27/2012   *RADIOLOGY REPORT*  Clinical Data: Abdominal pain.  ACUTE ABDOMEN SERIES (ABDOMEN 2 VIEW & CHEST 1 VIEW)  Comparison: Abdominal film on 03/21/2006 performed at [facility].  Findings: The chest shows probable  component of COPD.  There is scarring at the right lung base.  The heart size is normal.  No edema or infiltrates are seen.  Abdominal films show no evidence of bowel obstruction or ileus.  No free air is identified.  No abnormal calcifications are seen. Visualized bony structures are unremarkable.  IMPRESSION: No acute findings.  Probable component of COPD.  Clinically significant discrepancy from primary report, if provided: None   Original Report Authenticated By: Irish Lack, M.D.     1. Cholecystitis       MDM  Patient with abdominal pain. Cholecystitis on CT, but also does have common bile duct dilatation after discussion with general surgery patient will get an ultrasound.        Juliet Rude. Rubin Payor, MD 10/27/12 [id number]

## 2012-10-28 DIAGNOSIS — K810 Acute cholecystitis: Secondary | ICD-10-CM | POA: Diagnosis present

## 2012-10-28 DIAGNOSIS — I10 Essential (primary) hypertension: Secondary | ICD-10-CM

## 2012-10-28 LAB — CBC
MCH: 29.3 pg (ref 26.0–34.0)
MCHC: 32.6 g/dL (ref 30.0–36.0)
MCV: 89.8 fL (ref 78.0–100.0)
Platelets: 203 10*3/uL (ref 150–400)
RBC: 4.03 MIL/uL — ABNORMAL LOW (ref 4.22–5.81)
RDW: 14.2 % (ref 11.5–15.5)

## 2012-10-28 LAB — GLUCOSE, CAPILLARY

## 2012-10-28 MED ORDER — METOPROLOL TARTRATE 1 MG/ML IV SOLN
5.0000 mg | Freq: Four times a day (QID) | INTRAVENOUS | Status: DC | PRN
  Filled 2012-10-28: qty 5

## 2012-10-28 MED ORDER — LACTATED RINGERS IV BOLUS (SEPSIS): 1000.0000 mL | Freq: Three times a day (TID) | INTRAVENOUS | Status: DC | PRN

## 2012-10-28 MED ORDER — METOPROLOL SUCCINATE ER 25 MG PO TB24: 25.0000 mg | ORAL_TABLET | Freq: Every day | ORAL | Status: DC

## 2012-10-28 MED ORDER — ALUM & MAG HYDROXIDE-SIMETH 200-200-20 MG/5ML PO SUSP: 30.0000 mL | Freq: Four times a day (QID) | ORAL | Status: DC | PRN

## 2012-10-28 MED ORDER — SACCHAROMYCES BOULARDII 250 MG PO CAPS
250.0000 mg | ORAL_CAPSULE | Freq: Two times a day (BID) | ORAL | Status: DC
  Administered 2012-10-28 (×2): 250 mg via ORAL
  Filled 2012-10-28 (×4): qty 1

## 2012-10-28 MED ORDER — ACETAMINOPHEN 500 MG PO TABS
1000.0000 mg | ORAL_TABLET | Freq: Three times a day (TID) | ORAL | Status: DC
  Administered 2012-10-28 (×3): 1000 mg via ORAL
  Filled 2012-10-28 (×6): qty 2

## 2012-10-28 MED ORDER — MAGIC MOUTHWASH
15.0000 mL | Freq: Four times a day (QID) | ORAL | Status: DC | PRN
  Filled 2012-10-28: qty 15

## 2012-10-28 MED ORDER — PSYLLIUM 95 % PO PACK
1.0000 | PACK | Freq: Two times a day (BID) | ORAL | Status: DC
  Administered 2012-10-28 (×2): 1 via ORAL
  Filled 2012-10-28 (×4): qty 1

## 2012-10-28 MED ORDER — DIPHENHYDRAMINE HCL 50 MG/ML IJ SOLN: 12.5000 mg | Freq: Four times a day (QID) | INTRAMUSCULAR | Status: DC | PRN

## 2012-10-28 MED ORDER — OXYCODONE HCL 5 MG PO TABS
5.0000 mg | ORAL_TABLET | ORAL | Status: DC | PRN
  Administered 2012-10-28: 10 mg via ORAL
  Administered 2012-10-28: 5 mg via ORAL
  Administered 2012-10-29: 10 mg via ORAL
  Filled 2012-10-28: qty 2
  Filled 2012-10-28: qty 1
  Filled 2012-10-28: qty 2

## 2012-10-28 MED ORDER — ASPIRIN 81 MG PO TABS: 81.0000 mg | ORAL_TABLET | Freq: Every day | ORAL | Status: DC

## 2012-10-28 MED ORDER — LISINOPRIL-HYDROCHLOROTHIAZIDE 10-12.5 MG PO TABS: 1.0000 | ORAL_TABLET | Freq: Every day | ORAL | Status: DC

## 2012-10-28 MED ORDER — SODIUM CHLORIDE 0.9 % IV SOLN
3.0000 g | Freq: Four times a day (QID) | INTRAVENOUS | Status: DC
  Administered 2012-10-28 – 2012-10-29 (×4): 3 g via INTRAVENOUS
  Filled 2012-10-28 (×6): qty 3

## 2012-10-28 MED ORDER — MELOXICAM 7.5 MG PO TABS
7.5000 mg | ORAL_TABLET | Freq: Every day | ORAL | Status: DC
  Administered 2012-10-28: 7.5 mg via ORAL
  Filled 2012-10-28 (×2): qty 1

## 2012-10-28 MED ORDER — PHENOL 1.4 % MT LIQD
2.0000 | OROMUCOSAL | Status: DC | PRN
  Filled 2012-10-28: qty 177

## 2012-10-28 MED ORDER — LIP MEDEX EX OINT
1.0000 "application " | TOPICAL_OINTMENT | Freq: Two times a day (BID) | CUTANEOUS | Status: DC
  Administered 2012-10-28: 1 via TOPICAL

## 2012-10-28 MED ORDER — ASPIRIN 81 MG PO CHEW
81.0000 mg | CHEWABLE_TABLET | Freq: Every day | ORAL | Status: DC
  Administered 2012-10-28: 81 mg via ORAL
  Filled 2012-10-28 (×2): qty 1

## 2012-10-28 MED ORDER — SIMVASTATIN 20 MG PO TABS
20.0000 mg | ORAL_TABLET | Freq: Every day | ORAL | Status: DC
  Administered 2012-10-28: 20 mg via ORAL
  Filled 2012-10-28 (×2): qty 1

## 2012-10-28 MED ORDER — DIPHENHYDRAMINE HCL 25 MG PO CAPS: 25.0000 mg | ORAL_CAPSULE | Freq: Four times a day (QID) | ORAL | Status: DC | PRN

## 2012-10-28 NOTE — Anesthesia Postprocedure Evaluation (Signed)
Anesthesia Post Note  Patient: Louis Bryan  Procedure(s) Performed: Procedure(s) (LRB): LAPAROSCOPIC CHOLECYSTECTOMY WITH INTRAOPERATIVE CHOLANGIOGRAM (N/A)  Anesthesia type: General  Patient location: PACU  Post pain: Pain level controlled  Post assessment: Post-op Vital signs reviewed  Last Vitals:  Filed Vitals:   10/28/12 0236  BP: 110/72  Pulse: 85  Temp: 36.6 C  Resp: 18    Post vital signs: Reviewed  Level of consciousness: sedated  Complications: No apparent anesthesia complications

## 2012-10-28 NOTE — Progress Notes (Signed)
Louis Bryan [id number] 10/09/1935  CARE TEAM:  PCP: Lucilla Edin, MD  Outpatient Care Team: Patient Care Team: Collene Gobble, MD as PCP - General (Family Medicine)  Inpatient Treatment Team: Treatment Team: Attending Provider: Romie Levee, MD; Registered Nurse: Bethann Goo, RN   Subjective:  Pt sore  Tol PO Trying to walk more  Objective:  Vital signs:  Filed Vitals:   10/27/12 2238 10/27/12 2340 10/28/12 0236 10/28/12 0447  BP: 124/78 119/78 110/72 126/78  Pulse: 89 94 85 82  Temp: 98.3 F (36.8 C) 97.7 F (36.5 C) 97.8 F (36.6 C) 98.1 F (36.7 C)  TempSrc: Oral Oral Oral Oral  Resp: 18 18 18 18   Height:      Weight:      SpO2: 96% 97% 96% 95%    Last BM Date:  (pt doesnt know)  Intake/Output   Yesterday:  06/21 0701 - 06/22 0700 In: 1427.5 [I.V.:1427.5] Out: 625 [Urine:450; Drains:75; Blood:100] This shift:  Total I/O In: 380 [P.O.:380] Out: -   Bowel function:  Flatus: n  BM: n  Drain: serosanguinous  Physical Exam:  General: Pt awake/alert/oriented x4 in no acute distress Eyes: PERRL, normal EOM.  Sclera clear.  No icterus Neuro: CN II-XII intact w/o focal sensory/motor deficits. Lymph: No head/neck/groin lymphadenopathy Psych:  No delerium/psychosis/paranoia HENT: Normocephalic, Mucus membranes moist.  No thrush Neck: Supple, No tracheal deviation Chest: No chest wall pain w good excursion CV:  Pulses intact.  Regular rhythm MS: Normal AROM mjr joints.  No obvious deformity Abdomen: Soft.  Nondistended.  App tender at RUQ & incisions.  No evidence of peritonitis.  No incarcerated hernias. Ext:  SCDs BLE.  No mjr edema.  No cyanosis Skin: No petechiae / purpura   Problem List:   Principal Problem:   Acute gangrenous cholecystitis s/p lap chole 10/27/2012 Active Problems:   Hypertension   Hyperlipidemia   Assessment  Louis Bryan  77 y.o. male  1 Day Post-Op  Procedure(s): LAPAROSCOPIC CHOLECYSTECTOMY WITH INTRAOPERATIVE  CHOLANGIOGRAM  Stabilizing after emergent surgery with gangrenous cholecystitis an early shock  Plan:  Patient will need antibiotics for seven days.  Continue IV antibiotics  Advance to low-fat diet.  Wean off IV fluids.  Improve hypertension control  -VTE prophylaxis- SCDs, etc  -mobilize as tolerated to help recovery  D/C patient from hospital when patient meets criteria (anticipate in 1-2 day(s)):  Tolerating oral intake well Ambulating in walkways Adequate pain control without IV medications Urinating  Having flatus   Ardeth Sportsman, M.D., F.A.C.S. Gastrointestinal and Minimally Invasive Surgery [facility] [address], [address] [place], [place] [address] [phone] Main / Paging   10/28/2012   Results:   Labs: Results for orders placed during the hospital encounter of 10/27/12 (from the past 48 hour(s))  COMPREHENSIVE METABOLIC PANEL     Status: Abnormal   Collection Time    10/27/12 12:08 PM      Result Value Range   Sodium 134 (*) 135 - 145 mEq/L   Potassium 4.2  3.5 - 5.1 mEq/L   Chloride 96  96 - 112 mEq/L   CO2 29  19 - 32 mEq/L   Glucose, Bld 126 (*) 70 - 99 mg/dL   BUN 16  6 - 23 mg/dL   Creatinine, Ser 8.65  0.50 - 1.35 mg/dL   Calcium 9.5  8.4 - 78.4 mg/dL   Total Protein 8.5 (*) 6.0 - 8.3 g/dL   Albumin 3.3 (*)  3.5 - 5.2 g/dL   AST 14  0 - 37 U/L   ALT 13  0 - 53 U/L   Alkaline Phosphatase 101  39 - 117 U/L   Total Bilirubin 1.3 (*) 0.3 - 1.2 mg/dL   GFR calc non Af Amer 62 (*) >90 mL/min   GFR calc Af Amer 71 (*) >90 mL/min   Comment:            The eGFR has been calculated     using the CKD EPI equation.     This calculation has not been     validated in all clinical     situations.     eGFR's persistently     <90 mL/min signify     possible Chronic Kidney Disease.  LIPASE, BLOOD     Status: None   Collection Time    10/27/12 12:08 PM      Result Value Range   Lipase 21  11 - 59 U/L  CBC      Status: Abnormal   Collection Time    10/28/12  4:08 AM      Result Value Range   WBC 17.9 (*) 4.0 - 10.5 K/uL   RBC 4.03 (*) 4.22 - 5.81 MIL/uL   Hemoglobin 11.8 (*) 13.0 - 17.0 g/dL   HCT 16.1 (*) 09.6 - 04.5 %   MCV 89.8  78.0 - 100.0 fL   MCH 29.3  26.0 - 34.0 pg   MCHC 32.6  30.0 - 36.0 g/dL   RDW 40.9  81.1 - 91.4 %   Platelets 203  150 - 400 K/uL    Imaging / Studies: Dg Cholangiogram Operative  10/27/2012   *RADIOLOGY REPORT*  Clinical Data: Acute cholecystitis.  INTRAOPERATIVE CHOLANGIOGRAM  Technique:  Multiple fluoroscopic spot radiographs were obtained during intraoperative cholangiogram and are submitted for interpretation post-operatively.  Comparison: Ultrasound 10/27/2012.  Findings: Cine fluoroscopic spot images from an intraoperative cholangiogram demonstrate cannulation of the cystic duct.  The common bile duct is normal in caliber. A small filling defects are noted with a small distal rounded filling defect suspicious for a common bile duct stone.  There is normal drainage of contrast into the duodenum.  The visualized intrahepatic ducts appear normal.  No contrast extravasation is seen.  IMPRESSION: Suspect small common bile duct stones.   Original Report Authenticated By: Rudie Meyer, M.D.   US Abdomen Complete  10/27/2012   *RADIOLOGY REPORT*  Clinical Data:  Cholecystitis on the CT.  Abdominal pain and elevated white blood cell count  COMPLETE ABDOMINAL ULTRASOUND  Comparison:  CT 10/27/2012  Findings:  Gallbladder:  The gallbladder wall is thickened to 5 mm.  There is pericholecystic fluid present.  There multiple gallstones in the gallbladder.  Negative sonographic Murphy's sign.  Common bile duct:  The dilated at 7 mm.  Liver:  There is mild intrahepatic biliary ductal dilatation. There is several cystic lesions within the liver as demonstrated on comparison ultrasound.  IVC:  Appears normal.  Pancreas:  No focal abnormality seen.  Spleen:  Normal in size and  echogenicity.  Right Kidney:  13.08cm in length.  No hydronephrosis.  Simple cysts as demonstrated on comparison CT  Left Kidney:  12.2cm in length.  No evidence of hydronephrosis or stones.  Abdominal aorta:  No aneurysm identified.  IMPRESSION:  1.  Cholelithiasis with gallbladder wall thickening and pericholecystic fluid is most consistent with acute cholecystitis. Negative sonographic Murphy's sign. 2.  Dilatation of the common bile  duct to 7 mm. 3.  Mild intrahepatic biliary duct dilatation.   Original Report Authenticated By: Genevive Bi, M.D.   Ct Abdomen Pelvis W Contrast  10/27/2012   *RADIOLOGY REPORT*  Clinical Data: Right-sided abdominal pain.  CT ABDOMEN AND PELVIS WITH CONTRAST  Technique:  Multidetector CT imaging of the abdomen and pelvis was performed following the standard protocol during bolus administration of intravenous contrast.  Contrast: OMNIPAQUE IOHEXOL 300 MG/ML  SOLN  Comparison: Acute abdominal series earlier today.  Findings: There is evidence of acute cholecystitis.  The gallbladder contains multiple calcified gallstones and is distended.  There are diffuse inflammatory changes in the fat surrounding the gallbladder.  No focal abscess is identified.  There is some associated biliary dilatation with the common bile duct demonstrating maximal diameter of 11 mm.  No definite calculi are seen within the common bile duct.  There is no significant intrahepatic biliary ductal dilatation.  The liver itself contains several small cysts which are benign in appearance.  The pancreas, spleen, adrenal glands and kidneys are unremarkable. Benign appearing bilateral renal cysts are present.  Bowel shows no evidence of dilatation or obstruction.  Focal segment of the colon at the superior aspect of the hepatic flexure does show mild thickening where it abuts the inflamed gallbladder.  There is no evidence of free air or ascites.  Diverticulosis of the sigmoid colon present in the pelvis  without evidence of diverticulitis.  There is a small left inguinal hernia containing fat.  The bladder is unremarkable.  Bony structures are unremarkable.  IMPRESSION: Evidence of cholelithiasis and acute cholecystitis.  The common bile duct is also dilated, measuring up to 11 mm in greatest caliber.  No definite choledocholithiasis is identified by CT. Mild focal colonic wall thickening is present where the inflamed gallbladder abuts the region of the hepatic flexure of the colon.   Original Report Authenticated By: Irish Lack, M.D.   Dg Abd Acute W/chest  10/27/2012   *RADIOLOGY REPORT*  Clinical Data: Abdominal pain.  ACUTE ABDOMEN SERIES (ABDOMEN 2 VIEW & CHEST 1 VIEW)  Comparison: Abdominal film on 03/21/2006 performed at [facility].  Findings: The chest shows probable component of COPD.  There is scarring at the right lung base.  The heart size is normal.  No edema or infiltrates are seen.  Abdominal films show no evidence of bowel obstruction or ileus.  No free air is identified.  No abnormal calcifications are seen. Visualized bony structures are unremarkable.  IMPRESSION: No acute findings.  Probable component of COPD.  Clinically significant discrepancy from primary report, if provided: None   Original Report Authenticated By: Irish Lack, M.D.    Medications / Allergies: per chart  Antibiotics: Anti-infectives   Start     Dose/Rate Route Frequency Ordered Stop   10/28/12 0000  cefOXitin (MEFOXIN) 2 g in dextrose 5 % 50 mL IVPB     2 g 100 mL/hr over 30 Minutes Intravenous Every 6 hours 10/27/12 2049     10/27/12 1800  cefOXitin (MEFOXIN) 2 g in dextrose 5 % 50 mL IVPB     2 g 100 mL/hr over 30 Minutes Intravenous 60 min pre-op 10/27/12 1739 10/27/12 1812

## 2012-10-29 ENCOUNTER — Encounter (HOSPITAL_COMMUNITY): Payer: Self-pay | Admitting: General Surgery

## 2012-10-29 LAB — COMPREHENSIVE METABOLIC PANEL
ALT: 28 U/L (ref 0–53)
AST: 33 U/L (ref 0–37)
Albumin: 2.3 g/dL — ABNORMAL LOW (ref 3.5–5.2)
Alkaline Phosphatase: 75 U/L (ref 39–117)
Chloride: 99 mEq/L (ref 96–112)
Potassium: 4 mEq/L (ref 3.5–5.1)
Total Bilirubin: 0.5 mg/dL (ref 0.3–1.2)

## 2012-10-29 LAB — CBC
MCH: 30.3 pg (ref 26.0–34.0)
MCV: 89.3 fL (ref 78.0–100.0)
Platelets: 210 10*3/uL (ref 150–400)
RDW: 14.3 % (ref 11.5–15.5)

## 2012-10-29 MED ORDER — AMOXICILLIN-POT CLAVULANATE 875-125 MG PO TABS: 1.0000 | ORAL_TABLET | Freq: Two times a day (BID) | ORAL | Status: DC

## 2012-10-29 MED ORDER — CHLORHEXIDINE GLUCONATE 4 % EX LIQD
1.0000 "application " | Freq: Once | CUTANEOUS | Status: DC
  Filled 2012-10-29: qty 15

## 2012-10-29 MED ORDER — MORPHINE SULFATE 2 MG/ML IJ SOLN: 2.0000 mg | INTRAMUSCULAR | Status: DC | PRN

## 2012-10-29 MED ORDER — ONDANSETRON HCL 4 MG/2ML IJ SOLN: 4.0000 mg | Freq: Four times a day (QID) | INTRAMUSCULAR | Status: DC | PRN

## 2012-10-29 MED ORDER — OXYCODONE HCL 5 MG PO TABS: 5.0000 mg | ORAL_TABLET | ORAL | Status: DC | PRN

## 2012-10-29 MED ORDER — DEXTROSE 5 % IV SOLN
2.0000 g | Freq: Four times a day (QID) | INTRAVENOUS | Status: DC
  Filled 2012-10-29 (×2): qty 2

## 2012-10-29 NOTE — Discharge Summary (Signed)
Physician Discharge Summary  Patient ID: Louis Bryan MRN: [id number] DOB/AGE: [birth year] 77 y.o.  Admit date: 10/27/2012 Discharge date: 10/29/2012  Admission Diagnoses: cholecystitis  Discharge Diagnoses:  Principal Problem:   Acute gangrenous cholecystitis s/p lap chole 10/27/2012 Active Problems:   Hypertension   Hyperlipidemia   Discharged Condition: good  Hospital Course: The patient presented to ED with RUQ pain.  He was tachycardic with an elevated wbc.  RUQ US revealed cholecystitis.  He was taken emergently to the OR for a cholecystectomy.  His gallbladder was gangrenous.  He was admitted to the floor in stable condition after the OR.  He was placed on 24h IV antibiotics.  His wbc resolved.  His pain was minimal.  By POD 2 he was tolerating a diet and having BM's.  He will be discharged to home with a 7 day course of antibiotics.    Consults: None  Significant Diagnostic Studies: labs: cbc, CMET  Treatments: IV hydration, antibiotics: cefoxitin, analgesia: acetaminophen w/ codeine and surgery: lap chole  Discharge Exam: Blood pressure 133/78, pulse 78, temperature 98.1 F (36.7 C), temperature source Oral, resp. rate 18, height 5\' 7"  (1.702 m), weight 189 lb (85.73 kg), SpO2 92.00%. General appearance: alert and cooperative GI: normal findings: bowel sounds normal, soft, non-tender and slightly distended Incision/Wound: Clean, dry, intact  Disposition: Home   Future Appointments Provider Department Dept Phone   01/22/2013 10:00 AM Collene Gobble, MD [facility] [phone]       Medication List    ASK your doctor about these medications       aspirin 81 MG tablet  Take 81 mg by mouth daily.     docusate sodium 50 MG capsule  Commonly known as:  COLACE  Take by mouth daily. Patient takes OTC stool softener in addition to colace tablet.     lisinopril-hydrochlorothiazide 10-12.5 MG per tablet  Commonly known as:  PRINZIDE,ZESTORETIC  TAKE ONE  TABLET BY MOUTH ONE TIME DAILY     meloxicam 7.5 MG tablet  Commonly known as:  MOBIC  Take 1 tablet (7.5 mg total) by mouth daily.     metoprolol succinate 25 MG 24 hr tablet  Commonly known as:  TOPROL-XL  Take 1 tablet (25 mg total) by mouth daily. Take with or immediately following a meal.     simvastatin 20 MG tablet  Commonly known as:  ZOCOR  TAKE ONE TABLET BY MOUTH IN THE EVENING           Follow-up Information   Schedule an appointment as soon as possible for a visit with Vanita Panda., MD.   Contact information:   [address] [place] [address] [phone]       Signed: Vanita Panda 10/29/2012, 9:06 AM

## 2012-10-29 NOTE — Progress Notes (Signed)
Patient discharge with Rx for augmentin and oxycodone. States understanding of discharge instructions. Belongings from vault given to patient by Lauris Poag, RN

## 2012-10-29 NOTE — Care Management Note (Signed)
    Page 1 of 1   10/29/2012     10:48:18 AM   CARE MANAGEMENT NOTE 10/29/2012  Patient:  Louis Bryan,Louis Bryan   Account Number:  [id number]  Date Initiated:  10/28/2012  Documentation initiated by:  [employer]  Subjective/Objective Assessment:   77 year old male admiited s/p chelecystectomy.     Action/Plan:   From home.   Anticipated DC Date:  10/31/2012   Anticipated DC Plan:  HOME/SELF CARE      DC Planning Services  CM consult      Choice offered to / List presented to:             Status of service:  Completed, signed off Medicare Important Message given?  NA - LOS <3 / Initial given by admissions (If response is "NO", the following Medicare IM given date fields will be blank) Date Medicare IM given:   Date Additional Medicare IM given:    Discharge Disposition:  HOME/SELF CARE  Per UR Regulation:  Reviewed for med. necessity/level of care/duration of stay  If discussed at Long Length of Stay Meetings, dates discussed:    Comments:

## 2012-11-10 ENCOUNTER — Ambulatory Visit (INDEPENDENT_AMBULATORY_CARE_PROVIDER_SITE_OTHER): Payer: Medicare Other | Admitting: Family Medicine

## 2012-11-10 VITALS — BP 130/84 | HR 92 | Temp 98.6°F | Resp 18 | Ht 68.0 in | Wt 185.8 lb

## 2012-11-10 DIAGNOSIS — R748 Abnormal levels of other serum enzymes: Secondary | ICD-10-CM

## 2012-11-10 DIAGNOSIS — M25579 Pain in unspecified ankle and joints of unspecified foot: Secondary | ICD-10-CM

## 2012-11-10 DIAGNOSIS — M25571 Pain in right ankle and joints of right foot: Secondary | ICD-10-CM

## 2012-11-10 DIAGNOSIS — M109 Gout, unspecified: Secondary | ICD-10-CM

## 2012-11-10 LAB — COMPREHENSIVE METABOLIC PANEL
Albumin: 4.1 g/dL (ref 3.5–5.2)
Alkaline Phosphatase: 101 U/L (ref 39–117)
BUN: 16 mg/dL (ref 6–23)
Glucose, Bld: 113 mg/dL — ABNORMAL HIGH (ref 70–99)
Total Bilirubin: 0.8 mg/dL (ref 0.3–1.2)

## 2012-11-10 LAB — POCT CBC
HCT, POC: 43.8 % (ref 43.5–53.7)
Lymph, poc: 2.5 (ref 0.6–3.4)
MCH, POC: 30.1 pg (ref 27–31.2)
MCHC: 31.1 g/dL — AB (ref 31.8–35.4)
POC Granulocyte: 5.7 (ref 2–6.9)
POC LYMPH PERCENT: 27.7 %L (ref 10–50)
RDW, POC: 15 %
WBC: 8.9 10*3/uL (ref 4.6–10.2)

## 2012-11-10 MED ORDER — DICLOFENAC SODIUM 75 MG PO TBEC: 75.0000 mg | DELAYED_RELEASE_TABLET | Freq: Two times a day (BID) | ORAL | Status: DC

## 2012-11-10 NOTE — Patient Instructions (Addendum)
Take diclofenac one twice daily with food at breakfast and supper for gout and arthritis pain. When it starts feeling better you can decrease to just one pill daily.  Return to see Dr. Cleta Alberts one more time in about 3 weeks.

## 2012-11-10 NOTE — Progress Notes (Signed)
Subjective: 77 year old gentleman who has been having pain in his right ankle. He had similar problem a month or 2 ago and was treated with Mobic. It calmed down. His labs at that time showed a high normal uric acid. Subsequent to that he has had abdominal pain problems and ended up with a cholecystectomy. That has done well.  Objective: Pleasant alert gentleman in no major distress. Right ankle is tender, especially along the joint line below the malleolar lateral more than medial. The joint is not swollen, nor is there any erythema. Pulses are adequate.  Assessment: Ankle pain from probable gout Recent labs showing liver enzyme elevations (this was before the gallbladder was taken out, he should return to normal)  Plan: Will treat with diclofenac. Repeat the labs. If uric acid is still in the high normal level suggest considering longer term allopurinol at a low dose. Advised he see Dr. Cleta Alberts back in a few weeks to decide on this. I did check liver enzymes to make sure they're okay before considering allopurinol.

## 2012-11-12 ENCOUNTER — Encounter (INDEPENDENT_AMBULATORY_CARE_PROVIDER_SITE_OTHER): Payer: Self-pay | Admitting: General Surgery

## 2012-11-12 ENCOUNTER — Ambulatory Visit (INDEPENDENT_AMBULATORY_CARE_PROVIDER_SITE_OTHER): Payer: Medicare Other | Admitting: General Surgery

## 2012-11-12 VITALS — BP 110/82 | HR 74 | Temp 98.6°F | Resp 18 | Ht 67.0 in | Wt 186.2 lb

## 2012-11-12 DIAGNOSIS — Z9889 Other specified postprocedural states: Secondary | ICD-10-CM

## 2012-11-12 NOTE — Progress Notes (Signed)
Louis Bryan is a 77 y.o. male who is status post an emergent laparoscopic cholecystectomy on 6/21.  He is doing well.  He is eating well and having BM's.  His pain is minimal.    Objective: Filed Vitals:   11/12/12 1528  BP: 110/82  Pulse: 74  Temp: 98.6 F (37 C)  Resp: 18    General appearance: alert and cooperative GI: soft, non-tender; bowel sounds normal; no masses,  no organomegaly  Incision: healing well   Assessment: s/p  Patient Active Problem List   Diagnosis Date Noted  . Chronotropic incompetence - medication related 09/24/2012  . Hypertension 09/20/2011  . Hyperlipidemia 09/20/2011    Plan: Louis Bryan is a 77 y.o. M s/p lap chole for gangrenous cholecystitis.  He is doing well.  He can follow up with me PRN.      Marland KitchenVanita Panda, MD [facility], [place] [phone]   11/12/2012 3:36 PM

## 2012-11-12 NOTE — Patient Instructions (Signed)
Return to the office as needed 

## 2013-01-22 ENCOUNTER — Encounter: Payer: Self-pay | Admitting: Emergency Medicine

## 2013-01-22 ENCOUNTER — Ambulatory Visit (INDEPENDENT_AMBULATORY_CARE_PROVIDER_SITE_OTHER): Payer: Medicare Other | Admitting: Emergency Medicine

## 2013-01-22 VITALS — BP 120/74 | HR 61 | Temp 98.0°F | Resp 16 | Ht 67.0 in | Wt 188.0 lb

## 2013-01-22 DIAGNOSIS — E785 Hyperlipidemia, unspecified: Secondary | ICD-10-CM

## 2013-01-22 DIAGNOSIS — Z79899 Other long term (current) drug therapy: Secondary | ICD-10-CM

## 2013-01-22 DIAGNOSIS — M109 Gout, unspecified: Secondary | ICD-10-CM

## 2013-01-22 DIAGNOSIS — Z23 Encounter for immunization: Secondary | ICD-10-CM

## 2013-01-22 DIAGNOSIS — I10 Essential (primary) hypertension: Secondary | ICD-10-CM

## 2013-01-22 LAB — COMPREHENSIVE METABOLIC PANEL
ALT: 13 U/L (ref 0–53)
AST: 16 U/L (ref 0–37)
Albumin: 4.2 g/dL (ref 3.5–5.2)
Alkaline Phosphatase: 80 U/L (ref 39–117)
BUN: 13 mg/dL (ref 6–23)
Calcium: 9.7 mg/dL (ref 8.4–10.5)
Chloride: 104 mEq/L (ref 96–112)
Potassium: 3.8 mEq/L (ref 3.5–5.3)
Sodium: 141 mEq/L (ref 135–145)
Total Protein: 7.3 g/dL (ref 6.0–8.3)

## 2013-01-22 LAB — LIPID PANEL
Cholesterol: 159 mg/dL (ref 0–200)
LDL Cholesterol: 82 mg/dL (ref 0–99)
VLDL: 27 mg/dL (ref 0–40)

## 2013-01-22 MED ORDER — SIMVASTATIN 20 MG PO TABS: ORAL_TABLET | ORAL | Status: DC

## 2013-01-22 NOTE — Progress Notes (Signed)
  Subjective:    Patient ID: Louis Bryan, male    DOB: 08/07/1935, 77 y.o.   MRN: [id number]  HPI patient states he feels fine. He is much improved since having his gallbladder out. He continues to take his cholesterol and high antihypertensives he denies chest pain shortness of breath or any GI symptoms. He has not had any recent flares of his gout.    Review of Systems     Objective:   Physical Exam patient is alert and cooperative. His neck supple. Chest clear. Cardiac is regular rate no murmurs cholecystectomy scars are well-healed there is no abdominal tenderness extremity exam reveals no evidence of gout flare        Assessment & Plan:  Blood pressure is at goal 120/74 tolerating his medications without difficulty. His Zocor was refilled and routine labs were done recheck in January. He did receive his flu shot

## 2013-02-09 ENCOUNTER — Ambulatory Visit (INDEPENDENT_AMBULATORY_CARE_PROVIDER_SITE_OTHER): Payer: Medicare Other | Admitting: Family Medicine

## 2013-02-09 VITALS — BP 110/74 | HR 80 | Temp 98.0°F | Resp 18 | Wt 188.0 lb

## 2013-02-09 DIAGNOSIS — M25579 Pain in unspecified ankle and joints of unspecified foot: Secondary | ICD-10-CM

## 2013-02-09 DIAGNOSIS — M25571 Pain in right ankle and joints of right foot: Secondary | ICD-10-CM

## 2013-02-09 DIAGNOSIS — M109 Gout, unspecified: Secondary | ICD-10-CM

## 2013-02-09 MED ORDER — METHYLPREDNISOLONE ACETATE 80 MG/ML IJ SUSP
80.0000 mg | Freq: Once | INTRAMUSCULAR | Status: AC
  Administered 2013-02-09: 80 mg via INTRAMUSCULAR

## 2013-02-09 MED ORDER — DICLOFENAC SODIUM 75 MG PO TBEC: 75.0000 mg | DELAYED_RELEASE_TABLET | Freq: Two times a day (BID) | ORAL | Status: DC

## 2013-02-09 MED ORDER — ALLOPURINOL 100 MG PO TABS: 100.0000 mg | ORAL_TABLET | Freq: Every day | ORAL | Status: DC

## 2013-02-09 NOTE — Patient Instructions (Signed)
Take diclofenac one twice daily with food for gout. When your foot is feeling better in a few days decrease it to once daily.  One week from now begin taking allopurinol 1 daily for the long-term prevention of gout  Return in about 2-3 months to let Dr. Cleta Alberts for me recheck your labs.  Come in sooner for gout flares

## 2013-02-09 NOTE — Progress Notes (Signed)
Subjective: 77 year old man whom I seen in the past for gout. He was last treated acutely for it in the summer. He has not been on any long-term suppressive therapy such as allopurinol. He is generally doing well, and was fine yesterday. This morning he awakened with acutely painful right ankle and he was the gout again. He recently had a uric acid level checked which is elevated at 8.6.  Objective: Pleasant alert gentleman in no major distress. Ankle is tender and has pain on movement.  Assessment: Acute gouty arthritis attack Hyperuricemia  Plan: Depo-Medrol 80 IM. Diclofenac 75 twice daily until it calms down. One week from now he can begin taking the allopurinol, he should return in 2 or 3 months for recheck of labs.

## 2013-04-10 ENCOUNTER — Other Ambulatory Visit: Payer: Self-pay | Admitting: Physician Assistant

## 2013-05-21 ENCOUNTER — Ambulatory Visit (INDEPENDENT_AMBULATORY_CARE_PROVIDER_SITE_OTHER): Payer: Medicare Other | Admitting: Emergency Medicine

## 2013-05-21 ENCOUNTER — Encounter: Payer: Self-pay | Admitting: Emergency Medicine

## 2013-05-21 VITALS — BP 124/78 | HR 71 | Temp 98.1°F | Resp 16 | Ht 67.0 in | Wt 190.8 lb

## 2013-05-21 DIAGNOSIS — E785 Hyperlipidemia, unspecified: Secondary | ICD-10-CM

## 2013-05-21 DIAGNOSIS — M109 Gout, unspecified: Secondary | ICD-10-CM

## 2013-05-21 DIAGNOSIS — Z79899 Other long term (current) drug therapy: Secondary | ICD-10-CM

## 2013-05-21 DIAGNOSIS — I10 Essential (primary) hypertension: Secondary | ICD-10-CM

## 2013-05-21 LAB — CBC WITH DIFFERENTIAL/PLATELET
BASOS ABS: 0 10*3/uL (ref 0.0–0.1)
Basophils Relative: 0 % (ref 0–1)
Eosinophils Absolute: 0.2 10*3/uL (ref 0.0–0.7)
Eosinophils Relative: 2 % (ref 0–5)
HEMATOCRIT: 44.1 % (ref 39.0–52.0)
HEMOGLOBIN: 15.3 g/dL (ref 13.0–17.0)
LYMPHS PCT: 29 % (ref 12–46)
Lymphs Abs: 2.9 10*3/uL (ref 0.7–4.0)
MCH: 30.7 pg (ref 26.0–34.0)
MCHC: 34.7 g/dL (ref 30.0–36.0)
MCV: 88.6 fL (ref 78.0–100.0)
MONO ABS: 0.9 10*3/uL (ref 0.1–1.0)
Monocytes Relative: 9 % (ref 3–12)
NEUTROS ABS: 6.1 10*3/uL (ref 1.7–7.7)
Neutrophils Relative %: 60 % (ref 43–77)
Platelets: 248 10*3/uL (ref 150–400)
RBC: 4.98 MIL/uL (ref 4.22–5.81)
RDW: 15 % (ref 11.5–15.5)
WBC: 10.1 10*3/uL (ref 4.0–10.5)

## 2013-05-21 LAB — COMPLETE METABOLIC PANEL WITH GFR
ALBUMIN: 4.2 g/dL (ref 3.5–5.2)
ALT: 12 U/L (ref 0–53)
AST: 13 U/L (ref 0–37)
Alkaline Phosphatase: 87 U/L (ref 39–117)
BUN: 17 mg/dL (ref 6–23)
CALCIUM: 9.4 mg/dL (ref 8.4–10.5)
CHLORIDE: 106 meq/L (ref 96–112)
CO2: 25 meq/L (ref 19–32)
Creat: 1.03 mg/dL (ref 0.50–1.35)
GFR, EST AFRICAN AMERICAN: 81 mL/min
GFR, Est Non African American: 70 mL/min
Glucose, Bld: 91 mg/dL (ref 70–99)
POTASSIUM: 4 meq/L (ref 3.5–5.3)
SODIUM: 141 meq/L (ref 135–145)
TOTAL PROTEIN: 7.3 g/dL (ref 6.0–8.3)
Total Bilirubin: 1 mg/dL (ref 0.3–1.2)

## 2013-05-21 LAB — LIPID PANEL
Cholesterol: 171 mg/dL (ref 0–200)
HDL: 49 mg/dL (ref >39)
LDL CALC: 95 mg/dL (ref 0–99)
Total CHOL/HDL Ratio: 3.5 Ratio
Triglycerides: 134 mg/dL (ref <150)
VLDL: 27 mg/dL (ref 0–40)

## 2013-05-21 LAB — URIC ACID: Uric Acid, Serum: 7.7 mg/dL (ref 4.0–7.8)

## 2013-05-21 NOTE — Progress Notes (Signed)
Subjective:    Patient ID: Louis Bryan, male    DOB: [birth year], 78 y.o.   MRN: [id number]  HPI This chart was scribed for Louis Bryan Brach-MD, by Ladona Ridgel Day, Scribe. This patient was seen in room 22 and the patient's care was started at 11:25 AM.  HPI Comments: Louis Bryan is a 78 y.o. male who presents to the [facility] complaining for his 3 month check up and also reports right lower leg pain; no recent injury.  He reports that he has been having right lower leg pain, no injury, since x1 month ago. He states it occurs in the morning when he wakes up and that it does not feel like its falling asleep. He states pain does not radiate to his back. He denies any foot numbness. He states sleeping w/a pillow under his leg seems to help the problem. This problem resolves throughout the morning. He crosses his legs while sleeping.   He had the flu shot this year.   He has been taking his medicine for arthritis and hypertension.   Patient Active Problem List   Diagnosis Date Noted  . Chronotropic incompetence - medication related 09/24/2012  . Hypertension 09/20/2011  . Hyperlipidemia 09/20/2011   Past Surgical History  Procedure Laterality Date  . Cholecystectomy N/A 10/27/2012    Procedure: LAPAROSCOPIC CHOLECYSTECTOMY WITH INTRAOPERATIVE CHOLANGIOGRAM;  Surgeon: Romie Levee, MD;  Location: [facility];  Service: General;  Laterality: N/A;   Family History  Problem Relation Age of Onset  . Diabetes Sister    History   Social History  . Marital Status: Single    Spouse Name: N/A    Number of Children: 1  . Years of Education: N/A   Occupational History  . Not on file.   Social History Main Topics  . Smoking status: Former Smoker    Quit date: 06/13/1992  . Smokeless tobacco: Never Used  . Alcohol Use: No  . Drug Use: No  . Sexual Activity: Not on file   Other Topics Concern  . Not on file   Social History Narrative   He walks about 30 minutes 5-6 days a week.     No Known Allergies  Results for orders placed in visit on 01/22/13  LIPID PANEL      Result Value Range   Cholesterol 159  0 - 200 mg/dL   Triglycerides 409  <811 mg/dL   HDL 50  >91 mg/dL   Total CHOL/HDL Ratio 3.2     VLDL 27  0 - 40 mg/dL   LDL Cholesterol 82  0 - 99 mg/dL  COMPREHENSIVE METABOLIC PANEL      Result Value Range   Sodium 141  135 - 145 mEq/L   Potassium 3.8  3.5 - 5.3 mEq/L   Chloride 104  96 - 112 mEq/L   CO2 28  19 - 32 mEq/L   Glucose, Bld 97  70 - 99 mg/dL   BUN 13  6 - 23 mg/dL   Creat 4.78  2.95 - 6.21 mg/dL   Total Bilirubin 1.3 (*) 0.3 - 1.2 mg/dL   Alkaline Phosphatase 80  39 - 117 U/L   AST 16  0 - 37 U/L   ALT 13  0 - 53 U/L   Total Protein 7.3  6.0 - 8.3 g/dL   Albumin 4.2  3.5 - 5.2 g/dL   Calcium 9.7  8.4 - 30.8 mg/dL  URIC ACID      Result  Value Range   Uric Acid, Serum 8.6 (*) 4.0 - 7.8 mg/dL   Review of Systems Constitutional: Negative for fever and chills.  Respiratory: Negative for cough and shortness of breath.  Cardiovascular: Negative for chest pain.  Gastrointestinal: Negative for abdominal pain.  Musculoskeletal: Negative for back pain.  Right lateral lower leg pain      Objective:   Physical Exam Nursing note and vitals reviewed.  Constitutional: Patient is oriented to person, place, and time. Patient appears well-developed and well-nourished. No distress.  HENT:  Head: Normocephalic and atraumatic. Neck is Supple, no thyromegaly; no adenopathy.  Neck: Neck supple. No tracheal deviation present.  Cardiovascular: Normal rate, regular rhythm and normal heart sounds.  No murmur heard.  Pulmonary/Chest: Effort normal and breath sounds normal. No respiratory distress. Patient has no wheezes. Patient has no rales.  Musculoskeletal: Normal range of motion.  Neurological: Patient is alert and oriented to person, place, and time.  Skin: Skin is warm and dry.  Psychiatric: Patient has a normal mood and affect. Patient's  behavior is normal.   Triage Vitals: BP 124/78  Pulse 71  Temp(Src) 98.1 F (36.7 C) (Oral)  Resp 16  Ht 5\' 7"  (1.702 m)  Wt 190 lb 12.8 oz (86.546 kg)  BMI 29.88 kg/m2  SpO2 96%   DIAGNOSTIC STUDIES:  Oxygen Saturation is 96% on room air, normal by my interpretation.   COORDINATION OF CARE:  At 1055 AM Discussed treatment plan with patient which includes blood work. Patient agrees.      Assessment & Plan:  Condition stable at present. He will try and sleep with a pillow between his legs for now. If his leg pain persist would consider a trial of Neurontin to  I personally performed the services described in this documentation, which was scribed in my presence. The recorded information has been reviewed and is accurate.

## 2013-05-21 NOTE — Progress Notes (Deleted)
Subjective:    Patient ID: Louis Bryan, male    DOB: 1935-09-04, 78 y.o.   MRN: [id number]  HPI This chart was scribed for Viviann Spare Daub-MD, by Ladona Ridgel Amorina Doerr, Scribe. This patient was seen in room 22 and the patient's care was started at 11:10 AM.  HPI Comments: Louis Bryan is a 78 y.o. male who presents to the [facility] complaining for his 3 month check up and also reports right lower leg pain; no recent injury.  He reports that he has been having right lower leg pain, no injury, since x1 month ago. He states it occurs in the morning when he wakes up and that it does not feel like its falling asleep. He states pain does not radiate to his back. He denies any foot numbness. He states sleeping w/a pillow under his leg seems to help the problem. This problem resolves throughout the morning. He crosses his legs while sleeping.  He had the flu shot this year.   He has been taking his medicine for arthritis and hypertension.   Patient Active Problem List   Diagnosis Date Noted  . Chronotropic incompetence - medication related 09/24/2012  . Hypertension 09/20/2011  . Hyperlipidemia 09/20/2011   Past Surgical History  Procedure Laterality Date  . Cholecystectomy N/A 10/27/2012    Procedure: LAPAROSCOPIC CHOLECYSTECTOMY WITH INTRAOPERATIVE CHOLANGIOGRAM;  Surgeon: Romie Levee, MD;  Location: [facility];  Service: General;  Laterality: N/A;   Family History  Problem Relation Age of Onset  . Diabetes Sister    History   Social History  . Marital Status: Single    Spouse Name: N/A    Number of Children: 1  . Years of Education: N/A   Occupational History  . Not on file.   Social History Main Topics  . Smoking status: Former Smoker    Quit date: 06/13/1992  . Smokeless tobacco: Never Used  . Alcohol Use: No  . Drug Use: No  . Sexual Activity: Not on file   Other Topics Concern  . Not on file   Social History Narrative   He walks about 30 minutes 5-6 days a week.     No Known Allergies  Results for orders placed in visit on 01/22/13  LIPID PANEL      Result Value Range   Cholesterol 159  0 - 200 mg/dL   Triglycerides 409  <811 mg/dL   HDL 50  >91 mg/dL   Total CHOL/HDL Ratio 3.2     VLDL 27  0 - 40 mg/dL   LDL Cholesterol 82  0 - 99 mg/dL  COMPREHENSIVE METABOLIC PANEL      Result Value Range   Sodium 141  135 - 145 mEq/L   Potassium 3.8  3.5 - 5.3 mEq/L   Chloride 104  96 - 112 mEq/L   CO2 28  19 - 32 mEq/L   Glucose, Bld 97  70 - 99 mg/dL   BUN 13  6 - 23 mg/dL   Creat 4.78  2.95 - 6.21 mg/dL   Total Bilirubin 1.3 (*) 0.3 - 1.2 mg/dL   Alkaline Phosphatase 80  39 - 117 U/L   AST 16  0 - 37 U/L   ALT 13  0 - 53 U/L   Total Protein 7.3  6.0 - 8.3 g/dL   Albumin 4.2  3.5 - 5.2 g/dL   Calcium 9.7  8.4 - 30.8 mg/dL  URIC ACID      Result Value  Range   Uric Acid, Serum 8.6 (*) 4.0 - 7.8 mg/dL   Review of Systems  Constitutional: Negative for fever and chills.  Respiratory: Negative for cough and shortness of breath.   Cardiovascular: Negative for chest pain.  Gastrointestinal: Negative for abdominal pain.  Musculoskeletal: Negative for back pain.       Right lateral lower leg pain      Objective:   Physical Exam Nursing note and vitals reviewed. Constitutional: Patient is oriented to person, place, and time. Patient appears well-developed and well-nourished. No distress.  HENT:  Head: Normocephalic and atraumatic. Neck is Supple, no thyromegaly; no adenopathy.  Neck: Neck supple. No tracheal deviation present.  Cardiovascular: Normal rate, regular rhythm and normal heart sounds.   No murmur heard. Pulmonary/Chest: Effort normal and breath sounds normal. No respiratory distress. Patient has no wheezes. Patient has no rales.  Musculoskeletal: Normal range of motion.  Neurological: Patient is alert and oriented to person, place, and time.  Skin: Skin is warm and dry.  Psychiatric: Patient has a normal mood and affect. Patient's  behavior is normal.   Triage Vitals: BP 124/78  Pulse 71  Temp(Src) 98.1 F (36.7 C) (Oral)  Resp 16  Ht 5\' 7"  (1.702 m)  Wt 190 lb 12.8 oz (86.546 kg)  BMI 29.88 kg/m2  SpO2 96%  DIAGNOSTIC STUDIES: Oxygen Saturation is 96% on room air, normal by my interpretation.    COORDINATION OF CARE: At 1055 AM Discussed treatment plan with patient which includes blood work. Patient agrees.      Assessment & Plan:  Condition stable at present. He will try and sleep with a pillow between his legs for now. If his leg pain persist would consider a trial of Neurontin to I personally performed the services described in this documentation, which was scribed in my presence. The recorded information has been reviewed and is accurate.

## 2013-07-12 ENCOUNTER — Other Ambulatory Visit: Payer: Self-pay | Admitting: Physician Assistant

## 2013-09-13 ENCOUNTER — Other Ambulatory Visit: Payer: Self-pay | Admitting: Family Medicine

## 2013-09-13 ENCOUNTER — Other Ambulatory Visit: Payer: Self-pay | Admitting: Cardiology

## 2013-09-13 NOTE — Telephone Encounter (Signed)
Rx refill sent to patient pharmacy   

## 2013-09-26 ENCOUNTER — Encounter: Payer: Self-pay | Admitting: Cardiology

## 2013-09-26 ENCOUNTER — Ambulatory Visit (INDEPENDENT_AMBULATORY_CARE_PROVIDER_SITE_OTHER): Payer: Medicare Other | Admitting: Cardiology

## 2013-09-26 VITALS — BP 132/90 | Ht 66.0 in | Wt 194.6 lb

## 2013-09-26 DIAGNOSIS — I4589 Other specified conduction disorders: Secondary | ICD-10-CM

## 2013-09-26 DIAGNOSIS — E669 Obesity, unspecified: Secondary | ICD-10-CM

## 2013-09-26 DIAGNOSIS — E785 Hyperlipidemia, unspecified: Secondary | ICD-10-CM

## 2013-09-26 DIAGNOSIS — I10 Essential (primary) hypertension: Secondary | ICD-10-CM

## 2013-09-26 NOTE — Patient Instructions (Signed)
Your physician has requested that you have an exercise tolerance test. For further information please visit [email or web address]. Please also follow instruction sheet, as given ( IF TEST IS ABNORMAL, WILL SEE YOU SOONER)  Your physician wants you to follow-up in 12 MONTHS DR Herbie BaltimoreHarding. You will receive a reminder letter in the mail two months in advance. If you don't receive a letter, please call our office to schedule the follow-up appointment.

## 2013-09-29 ENCOUNTER — Encounter: Payer: Self-pay | Admitting: Cardiology

## 2013-09-29 DIAGNOSIS — Z6829 Body mass index (BMI) 29.0-29.9, adult: Secondary | ICD-10-CM | POA: Insufficient documentation

## 2013-09-29 NOTE — Assessment & Plan Note (Signed)
Beta Blocker dose was reduced last year -- HR 68 bpm onEKG. We no longer have the CPET Test available -- will recheck chronotropic response with Exercise Tolerance Test.

## 2013-09-29 NOTE — Progress Notes (Signed)
Clinic Note:  Patient ID: Louis LiMoses Torrez, male   DOB: 04/20/1936, 78 y.o.   MRN: [id number]  PCP: Lucilla EdinAUB, STEVE A, MD Chief Complaint:  Chief Complaint  Patient presents with  . Annual Exam    NO CHEST APIN , NO SOB ,NO EDEMA, WILL SEE PCP IN JULY 2015    HPI: Louis Bryan is a 78 y.o. male with a PMH below who presents today for annual follow-up. Last year he had a CPET test that was difficult to interpret due to inability to meet target HR.  BB dose decreased. Results of the CPET test:  Submaximal effort of 0.84 with goal of 1.09.   The heart rate only increased from 80-100 beats per minute. He  Submaximal effort the peak Oxygen Consumption (VO2) was 68% which is moderately abnormal, however underestimated due to chronotropic incompetence.  PFT Spirometry results:   FVC --  2.75, 85%-normal  FEV1 -- 1.49, 63%-low  FEV1/FVC -- 54.2-low  Interval History: He continues to do well with no major problems. He denies any symptoms of fatigue or tiredness, but does note exertional dyspnea while walking up hills or stairs. He does have some morning cough is but nothing significant. No significant wheezing. Occasional ankle swelling.  Minimal palpitations. No chest pain with rest or exertion.  No PND, orthopnea or edema. No lightheadedness, dizziness, weakness or syncope/near syncope. No TIA/amaurosis fugax symptoms. No melena, hematochezia, hematuria, or epstaxis. No claudication - his legs do get somewhat tired but after maybe a half an hour of walking.  No Known Allergies  Current Outpatient Prescriptions on File Prior to Visit  Medication Sig Dispense Refill  . allopurinol (ZYLOPRIM) 100 MG tablet TAKE 1 TABLET BY MOUTH EVERY DAY  30 tablet  1  . aspirin 81 MG tablet Take 81 mg by mouth daily.      Marland Kitchen. lisinopril-hydrochlorothiazide (PRINZIDE,ZESTORETIC) 10-12.5 MG per tablet TAKE 1 TABLET BY MOUTH EVERY DAY  90 tablet  0  . meloxicam (MOBIC) 7.5 MG tablet Take 1 tablet (7.5 mg total)  by mouth daily.  14 tablet  0  . metoprolol succinate (TOPROL-XL) 25 MG 24 hr tablet TAKE ONE TABLET BY MOUTH ONE TIME DAILY WITH FOOD  30 tablet  0  . simvastatin (ZOCOR) 20 MG tablet TAKE ONE TABLET BY MOUTH IN THE EVENING  30 tablet  11   No current facility-administered medications on file prior to visit.   Past Medical History  Diagnosis Date  . Hypertension   . Hyperlipidemia   . Shortness of breath     On exertion  . Tobacco abuse   . History of exercise stress test [phone]    negative bruce protocol excercise stress test with scintigraphic evidence of diaphragmatic attenuation and mild apical thinning, dynamic gating was not performed secondary to frequent ectopy, low risk study  . Chronotropic incompetence - medication related 09/24/2012    Past Surgical History  Procedure Laterality Date  . Cholecystectomy N/A 10/27/2012    Procedure: LAPAROSCOPIC CHOLECYSTECTOMY WITH INTRAOPERATIVE CHOLANGIOGRAM;  Surgeon: Romie LeveeAlicia Thomas, MD;  Location: [facility];  Service: General;  Laterality: N/A;    History   Social History  . Marital Status: Single    Spouse Name: N/A    Number of Children: 1  . Years of Education: N/A   Occupational History  . Not on file.   Social History Main Topics  . Smoking status: Former Smoker    Quit date: 06/13/1992  . Smokeless tobacco: Never Used  .  Alcohol Use: No  . Drug Use: No  . Sexual Activity: Not on file   Other Topics Concern  . Not on file   Social History Narrative   He walks about 30 minutes 5-6 days a week.    ROS: A comprehensive Review of Systems - Negative except occasional AM cough Respiratory ROS: negative for - orthopnea, pleuritic pain, shortness of breath, stridor, tachypnea or wheezing  PHYSICAL EXAM BP 132/90  Ht 5\' 6"  (1.676 m)  Wt 194 lb 9.6 oz (88.27 kg)  BMI 31.42 kg/m2 General appearance: alert, cooperative, appears stated age, no distress and mildly obese Neck: no adenopathy, no carotid bruit and no  JVD Lungs: CTAB, normal percussion bilaterally and non-labored; mild end expiratory wheeze during forced expiration Heart: RRR, S1, S2 normal, no murmur, click, rub or gallop ; non-displaced PMI Abdomen: soft, non-tender; bowel sounds normal; no masses,  no organomegaly; mild truncal obesity Extremities: extremities normal, atraumatic, no cyanosis or edema Pulses: 2+ and symmetric; Skin: normal or no rash or lesions Neurologic: Mental status: Alert, oriented, thought content appropriate Cranial nerves: normal (II-XII grossly intact)  AST:[id number] today: Yes.  NSR, 68 bpm; Normal EKG  ASSESSMENT / PLAN:  Chronotropic incompetence - medication related Beta Blocker dose was reduced last year -- HR 68 bpm onEKG. We no longer have the CPET Test available -- will recheck chronotropic response with Exercise Tolerance Test.  Hypertension Borderline control, may need to increase ACE-I dose if BP increases.   Hyperlipidemia Labs from Jan 2015 reviewed - TC 171, HDL 49, LDL 95, TG 134.  Relatively well controlled on statin. Followed by PCP.  Obesity (BMI 30-39.9) Counseled re dietary modification & increased exercise regimen.   Orders Placed This Encounter  Procedures  . EKG 12-Lead  . Exercise tolerance test    Standing Status: Future     Number of Occurrences:      Standing Expiration Date: 09/27/2014    Order Specific Question:  Where should this test be performed    Answer:  [facility]    No orders of the defined types were placed in this encounter.   Follow-up  Dr Herbie Baltimore in 12 months.  [name], M.D., M.S. Interventional Cardiologist   Pager # [phone] 09/29/2013

## 2013-09-29 NOTE — Assessment & Plan Note (Signed)
Counseled re dietary modification & increased exercise regimen.

## 2013-09-29 NOTE — Assessment & Plan Note (Signed)
Labs from Jan 2015 reviewed - TC 171, HDL 49, LDL 95, TG 134.  Relatively well controlled on statin. Followed by PCP.

## 2013-09-29 NOTE — Assessment & Plan Note (Signed)
Borderline control, may need to increase ACE-I dose if BP increases.

## 2013-10-01 ENCOUNTER — Telehealth (HOSPITAL_COMMUNITY): Payer: Self-pay

## 2013-10-07 ENCOUNTER — Telehealth (HOSPITAL_COMMUNITY): Payer: Self-pay

## 2013-10-08 ENCOUNTER — Ambulatory Visit (HOSPITAL_COMMUNITY)
Admission: RE | Admit: 2013-10-08 | Discharge: 2013-10-08 | Disposition: A | Discharge status: Home / Self Care | Payer: Medicare Other | Source: Ambulatory Visit | Attending: Internal Medicine | Admitting: Internal Medicine

## 2013-10-08 DIAGNOSIS — E785 Hyperlipidemia, unspecified: Secondary | ICD-10-CM

## 2013-10-08 DIAGNOSIS — N529 Male erectile dysfunction, unspecified: Secondary | ICD-10-CM | POA: Insufficient documentation

## 2013-10-08 DIAGNOSIS — I4589 Other specified conduction disorders: Secondary | ICD-10-CM

## 2013-10-08 DIAGNOSIS — I10 Essential (primary) hypertension: Secondary | ICD-10-CM

## 2013-10-10 NOTE — Progress Notes (Signed)
Quick Note:  Good news - able to get HR up to above target range.  This was what we were hoping to see.  [name], MD  ______

## 2013-10-11 ENCOUNTER — Telehealth: Payer: Self-pay | Admitting: *Deleted

## 2013-10-11 NOTE — Telephone Encounter (Signed)
Left message to call back Will inform patient about test results

## 2013-10-11 NOTE — Telephone Encounter (Signed)
Message copied by Tobin Chad on Fri Oct 11, 2013  9:03 AM ------      Message from: [name]      Created: Thu Oct 10, 2013  6:51 PM       Good news - able to get HR up to above target range.            This was what we were hoping to see.            [name], MD       ------

## 2013-10-14 ENCOUNTER — Other Ambulatory Visit: Payer: Self-pay | Admitting: Cardiology

## 2013-10-14 ENCOUNTER — Other Ambulatory Visit: Payer: Self-pay | Admitting: Emergency Medicine

## 2013-10-14 NOTE — Telephone Encounter (Signed)
Rx was sent to pharmacy electronically. 

## 2013-10-29 NOTE — Telephone Encounter (Signed)
Spoke to patient. Result given . Verbalized understanding  

## 2013-11-06 NOTE — Telephone Encounter (Signed)
Encounter complete. 

## 2013-11-14 ENCOUNTER — Other Ambulatory Visit: Payer: Self-pay | Admitting: Emergency Medicine

## 2013-11-14 NOTE — Telephone Encounter (Signed)
Encounter complete. 

## 2013-11-26 ENCOUNTER — Encounter: Payer: Self-pay | Admitting: Emergency Medicine

## 2013-11-26 ENCOUNTER — Telehealth: Payer: Self-pay | Admitting: *Deleted

## 2013-11-26 ENCOUNTER — Ambulatory Visit (INDEPENDENT_AMBULATORY_CARE_PROVIDER_SITE_OTHER): Payer: Medicare Other

## 2013-11-26 ENCOUNTER — Ambulatory Visit (INDEPENDENT_AMBULATORY_CARE_PROVIDER_SITE_OTHER): Payer: Medicare Other | Admitting: Emergency Medicine

## 2013-11-26 VITALS — BP 108/74 | HR 72 | Temp 98.5°F | Resp 18 | Ht 66.0 in | Wt 192.0 lb

## 2013-11-26 DIAGNOSIS — M25519 Pain in unspecified shoulder: Secondary | ICD-10-CM

## 2013-11-26 DIAGNOSIS — M10471 Other secondary gout, right ankle and foot: Secondary | ICD-10-CM

## 2013-11-26 DIAGNOSIS — E785 Hyperlipidemia, unspecified: Secondary | ICD-10-CM

## 2013-11-26 DIAGNOSIS — M25512 Pain in left shoulder: Secondary | ICD-10-CM

## 2013-11-26 DIAGNOSIS — Z0000 Encounter for general adult medical examination without abnormal findings: Secondary | ICD-10-CM

## 2013-11-26 DIAGNOSIS — M25579 Pain in unspecified ankle and joints of unspecified foot: Secondary | ICD-10-CM

## 2013-11-26 DIAGNOSIS — K921 Melena: Secondary | ICD-10-CM

## 2013-11-26 DIAGNOSIS — Z125 Encounter for screening for malignant neoplasm of prostate: Secondary | ICD-10-CM

## 2013-11-26 DIAGNOSIS — M109 Gout, unspecified: Secondary | ICD-10-CM

## 2013-11-26 DIAGNOSIS — I10 Essential (primary) hypertension: Secondary | ICD-10-CM

## 2013-11-26 DIAGNOSIS — M25571 Pain in right ankle and joints of right foot: Secondary | ICD-10-CM

## 2013-11-26 DIAGNOSIS — Z23 Encounter for immunization: Secondary | ICD-10-CM

## 2013-11-26 LAB — COMPLETE METABOLIC PANEL WITH GFR
ALBUMIN: 4.4 g/dL (ref 3.5–5.2)
ALT: 13 U/L (ref 0–53)
AST: 12 U/L (ref 0–37)
Alkaline Phosphatase: 86 U/L (ref 39–117)
BUN: 14 mg/dL (ref 6–23)
CALCIUM: 9.2 mg/dL (ref 8.4–10.5)
CO2: 26 mEq/L (ref 19–32)
Chloride: 102 mEq/L (ref 96–112)
Creat: 1.2 mg/dL (ref 0.50–1.35)
GFR, Est African American: 67 mL/min
GFR, Est Non African American: 58 mL/min — ABNORMAL LOW
Glucose, Bld: 102 mg/dL — ABNORMAL HIGH (ref 70–99)
POTASSIUM: 4.4 meq/L (ref 3.5–5.3)
Sodium: 139 mEq/L (ref 135–145)
Total Bilirubin: 0.9 mg/dL (ref 0.2–1.2)
Total Protein: 7.4 g/dL (ref 6.0–8.3)

## 2013-11-26 LAB — CBC WITH DIFFERENTIAL/PLATELET
BASOS ABS: 0 10*3/uL (ref 0.0–0.1)
Basophils Relative: 0 % (ref 0–1)
Eosinophils Absolute: 0.2 10*3/uL (ref 0.0–0.7)
Eosinophils Relative: 2 % (ref 0–5)
HCT: 45.5 % (ref 39.0–52.0)
Hemoglobin: 15.7 g/dL (ref 13.0–17.0)
LYMPHS PCT: 35 % (ref 12–46)
Lymphs Abs: 3.2 10*3/uL (ref 0.7–4.0)
MCH: 30.8 pg (ref 26.0–34.0)
MCHC: 34.5 g/dL (ref 30.0–36.0)
MCV: 89.2 fL (ref 78.0–100.0)
Monocytes Absolute: 1.2 10*3/uL — ABNORMAL HIGH (ref 0.1–1.0)
Monocytes Relative: 13 % — ABNORMAL HIGH (ref 3–12)
NEUTROS ABS: 4.6 10*3/uL (ref 1.7–7.7)
Neutrophils Relative %: 50 % (ref 43–77)
PLATELETS: 224 10*3/uL (ref 150–400)
RBC: 5.1 MIL/uL (ref 4.22–5.81)
RDW: 14.5 % (ref 11.5–15.5)
WBC: 9.2 10*3/uL (ref 4.0–10.5)

## 2013-11-26 LAB — LIPID PANEL
Cholesterol: 164 mg/dL (ref 0–200)
HDL: 48 mg/dL (ref >39)
LDL CALC: 80 mg/dL (ref 0–99)
TRIGLYCERIDES: 182 mg/dL — AB (ref <150)
Total CHOL/HDL Ratio: 3.4 Ratio
VLDL: 36 mg/dL (ref 0–40)

## 2013-11-26 LAB — URIC ACID: URIC ACID, SERUM: 6.6 mg/dL (ref 4.0–7.8)

## 2013-11-26 MED ORDER — MELOXICAM 7.5 MG PO TABS: 7.5000 mg | ORAL_TABLET | Freq: Every day | ORAL | Status: DC

## 2013-11-26 MED ORDER — METOPROLOL SUCCINATE ER 25 MG PO TB24: ORAL_TABLET | ORAL | Status: DC

## 2013-11-26 MED ORDER — SIMVASTATIN 20 MG PO TABS: ORAL_TABLET | ORAL | Status: DC

## 2013-11-26 MED ORDER — PREDNISONE 10 MG PO TABS: ORAL_TABLET | ORAL | Status: DC

## 2013-11-26 MED ORDER — ALLOPURINOL 100 MG PO TABS: ORAL_TABLET | ORAL | Status: DC

## 2013-11-26 MED ORDER — LISINOPRIL-HYDROCHLOROTHIAZIDE 10-12.5 MG PO TABS: ORAL_TABLET | ORAL | Status: DC

## 2013-11-26 NOTE — Telephone Encounter (Signed)
Telephone msg left on pts voicemail to come by the office to collect a urine sample. Dr. Cleta Albertsaub would like a dipstick, micro if any abnormal readings on dip.

## 2013-11-26 NOTE — Progress Notes (Deleted)
   Subjective:    Patient ID: Louis Bryan, male    DOB: 10/28/1935, 78 y.o.   MRN: [id number]  HPI    Review of Systems  Constitutional: Negative.   Eyes: Negative.   Respiratory: Negative.   Cardiovascular: Negative.   Gastrointestinal: Negative.   Endocrine: Negative.   Genitourinary: Negative.   Musculoskeletal: Negative.   Skin: Negative.   Allergic/Immunologic: Negative.   Neurological: Positive for headaches.  Hematological: Negative.   Psychiatric/Behavioral: Negative.        Objective:   Physical Exam        Assessment & Plan:

## 2013-11-26 NOTE — Progress Notes (Signed)
 @[facility] @  Patient ID: Louis Bryan MRN: [id number], DOB: 09/03/1935 78 y.o. Date of Encounter: 11/26/2013, 11:28 AM  Primary Physician: Lucilla EdinAUB, STEVE A, MD  Chief Complaint: Physical (CPE)  HPI: 78 y.o. y/o male with history noted below here for CPE.  Doing well. No issues/complaints.  Review of Systems: Consitutional: No fever, chills, fatigue, night sweats, lymphadenopathy, or weight changes. Eyes: No visual changes, eye redness, or discharge. ENT/Mouth: Ears: No otalgia, tinnitus, hearing loss, discharge. Nose: No congestion, rhinorrhea, sinus pain, or epistaxis. Throat: No sore throat, post nasal drip, or teeth pain. Cardiovascular: No CP, palpitations, diaphoresis, DOE, edema, orthopnea, PND. Respiratory: No cough, hemoptysis, SOB, or wheezing. Gastrointestinal: No anorexia, dysphagia, reflux, pain, nausea, vomiting, hematemesis, diarrhea, constipation, BRBPR, or melena. Genitourinary: No dysuria, frequency, urgency, hematuria, incontinence, nocturia, decreased urinary stream, discharge, impotence, or testicular pain/masses. Musculoskeletal: He has a one-month history of pain in his left shoulder and pain when he elevates the left shoulder Skin: No rash, erythema, lesion changes, pain, warmth, jaundice, or pruritis. Neurological: No  dizziness, syncope, seizures, tremors, memory loss, coordination problems, or paresthesias. He has had mild problems with headaches but they have not been severe Psychological: No anxiety, depression, hallucinations, SI/HI. Endocrine: No fatigue, polydipsia, polyphagia, polyuria, or known diabetes. All other systems were reviewed and are otherwise negative.  Past Medical History  Diagnosis Date  . Hypertension   . Hyperlipidemia   . Shortness of breath     On exertion  . Tobacco abuse   . History of exercise stress test [phone]    negative bruce protocol excercise stress test with scintigraphic evidence of diaphragmatic attenuation and mild apical  thinning, dynamic gating was not performed secondary to frequent ectopy, low risk study  . Chronotropic incompetence - medication related 09/24/2012     Past Surgical History  Procedure Laterality Date  . Cholecystectomy N/A 10/27/2012    Procedure: LAPAROSCOPIC CHOLECYSTECTOMY WITH INTRAOPERATIVE CHOLANGIOGRAM;  Surgeon: Romie LeveeAlicia Thomas, MD;  Location: [facility];  Service: General;  Laterality: N/A;    Home Meds:  Prior to Admission medications   Medication Sig Start Date End Date Taking? Authorizing Provider  allopurinol (ZYLOPRIM) 100 MG tablet TAKE 1 TABLET BY MOUTH DAILY 11/14/13  Yes Morrell RiddleSarah L Weber, PA-C  aspirin 81 MG tablet Take 81 mg by mouth daily.   Yes Historical Provider, MD  lisinopril-hydrochlorothiazide (PRINZIDE,ZESTORETIC) 10-12.5 MG per tablet TAKE 1 TABLET BY MOUTH EVERY DAY 10/14/13  Yes Collene GobbleSteven A Matai Carpenito, MD  metoprolol succinate (TOPROL-XL) 25 MG 24 hr tablet TAKE 1 TABLET BY MOUTH EVERY DAY WITH FOOD 10/14/13  Yes Marykay Lexavid W Harding, MD  simvastatin (ZOCOR) 20 MG tablet TAKE ONE TABLET BY MOUTH IN THE EVENING 01/22/13  Yes Collene GobbleSteven A Seichi Kaufhold, MD  meloxicam (MOBIC) 7.5 MG tablet Take 1 tablet (7.5 mg total) by mouth daily. 10/15/12   Collene GobbleSteven A Joyce Heitman, MD    Allergies: No Known Allergies  History   Social History  . Marital Status: Single    Spouse Name: N/A    Number of Children: 1  . Years of Education: N/A   Occupational History  . Retired    Social History Main Topics  . Smoking status: Former Smoker    Quit date: 06/13/1992  . Smokeless tobacco: Never Used  . Alcohol Use: No  . Drug Use: No  . Sexual Activity: Yes   Other Topics Concern  . Not on file   Social History Narrative   He walks about 30 minutes 5-6 days a week. Divorced.  Family History  Problem Relation Age of Onset  . Diabetes Sister   . Diabetes Sister     Physical Exam: Blood pressure 108/74, pulse 72, temperature 98.5 F (36.9 C), temperature source Oral, resp. rate 18, height 5\' 6"  (1.676 m),  weight 192 lb (87.091 kg), SpO2 94.00%.  General: Well developed, well nourished, in no acute distress. HEENT: Normocephalic, atraumatic. Conjunctiva pink, sclera non-icteric. Pupils 2 mm constricting to 1 mm, round, regular, and equally reactive to light and accomodation. EOMI. Internal auditory canal clear. TMs with good cone of light and without pathology. Nasal mucosa pink. Nares are without discharge. No sinus tenderness. Oral mucosa pink. Dentition. Pharynx without exudate.   Neck: Supple. Trachea midline. No thyromegaly. Full ROM. No lymphadenopathy. Lungs: Clear to auscultation bilaterally without wheezes, rales, or rhonchi. Breathing is of normal effort and unlabored. Cardiovascular: RRR with S1 S2. No murmurs, rubs, or gallops appreciated. Distal pulses 2+ symmetrically. No carotid or abdominal bruits. Abdomen: Soft, non-tender, non-distended with normoactive bowel sounds. No hepatosplenomegaly or masses. No rebound/guarding. No CVA tenderness. Without hernias.  Rectal: No external hemorrhoids or fissures. Rectal vault without masses.  Genitourinary:  circumcised male. No penile lesions. Testes descended bilaterally, and smooth without tenderness or masses.  Musculoskeletal: Full range of motion and 5/5 strength throughout. Without swelling, atrophy, tenderness, crepitus, or warmth. Extremities without clubbing, cyanosis, or edema. Calves supple. There is crepitation with abduction of the left shoulder appear Skin: Warm and moist without erythema, ecchymosis, wounds, or rash. Neuro: A+Ox3. CN II-XII grossly intact. Moves all extremities spontaneously. Full sensation throughout. Normal gait. DTR 2+ throughout upper and lower extremities. Finger to nose intact. Psych:  Responds to questions appropriately with a normal affect.  [facility] reading (PRIMARY) by  Dr.Charizma Gardiner shoulder films are normal  Hemmosure positive Assessment/Plan:  78 y.o. y/o here for his physical. His examination is really good  for his age. He does have a bursutis  of his left shoulder and we'll treat this with a prednisone taper. Other medications will also be refilled. His routine medications were refilled he was given the Prevnar vaccine. His stool for blood was positive today. Referral made to Dr. Elnoria Howard to evaluate for evaluation -  Signed, Earl Lites, MD 11/26/2013 11:28 AM

## 2013-11-26 NOTE — Telephone Encounter (Signed)
Called pharmacy to cancel Mobic, per Dr Cleta Albertsaub. Spoke to QuilceneNatalie in the pharmacy department.

## 2013-11-26 NOTE — Telephone Encounter (Signed)
Spoke with patient regarding positive hemosure results, per Dr. Cleta Albertsaub made referral to Dr. Elnoria HowardHung, also pt was advised to return to office for urine collection.

## 2013-11-27 LAB — PSA, MEDICARE: PSA: 0.9 ng/mL (ref <=4.00)

## 2013-11-28 LAB — POCT URINALYSIS DIPSTICK
Bilirubin, UA: NEGATIVE
Blood, UA: NEGATIVE
Glucose, UA: NEGATIVE
KETONES UA: NEGATIVE
Leukocytes, UA: NEGATIVE
Nitrite, UA: NEGATIVE
PH UA: 5.5
PROTEIN UA: NEGATIVE
Spec Grav, UA: 1.02
Urobilinogen, UA: 0.2

## 2013-11-28 NOTE — Addendum Note (Signed)
Addended by: Gerrianne ScalePAYNE, Aariel Ems L on: 11/28/2013 09:29 AM   Modules accepted: Orders

## 2014-01-17 ENCOUNTER — Encounter: Payer: Self-pay | Admitting: Emergency Medicine

## 2014-05-27 ENCOUNTER — Encounter: Payer: Self-pay | Admitting: Emergency Medicine

## 2014-05-27 ENCOUNTER — Ambulatory Visit (INDEPENDENT_AMBULATORY_CARE_PROVIDER_SITE_OTHER): Payer: Medicare Other | Admitting: Emergency Medicine

## 2014-05-27 VITALS — BP 120/80 | HR 72 | Temp 98.1°F | Resp 16 | Ht 67.25 in | Wt 195.6 lb

## 2014-05-27 DIAGNOSIS — Z23 Encounter for immunization: Secondary | ICD-10-CM

## 2014-05-27 DIAGNOSIS — M109 Gout, unspecified: Secondary | ICD-10-CM

## 2014-05-27 DIAGNOSIS — E785 Hyperlipidemia, unspecified: Secondary | ICD-10-CM | POA: Diagnosis not present

## 2014-05-27 DIAGNOSIS — I10 Essential (primary) hypertension: Secondary | ICD-10-CM | POA: Diagnosis not present

## 2014-05-27 LAB — CBC WITH DIFFERENTIAL/PLATELET
BASOS PCT: 0 % (ref 0–1)
Basophils Absolute: 0 10*3/uL (ref 0.0–0.1)
EOS PCT: 2 % (ref 0–5)
Eosinophils Absolute: 0.2 10*3/uL (ref 0.0–0.7)
HCT: 45.7 % (ref 39.0–52.0)
Hemoglobin: 16 g/dL (ref 13.0–17.0)
LYMPHS ABS: 2.9 10*3/uL (ref 0.7–4.0)
LYMPHS PCT: 38 % (ref 12–46)
MCH: 31.3 pg (ref 26.0–34.0)
MCHC: 35 g/dL (ref 30.0–36.0)
MCV: 89.4 fL (ref 78.0–100.0)
MPV: 9.9 fL (ref 8.6–12.4)
Monocytes Absolute: 0.5 10*3/uL (ref 0.1–1.0)
Monocytes Relative: 7 % (ref 3–12)
NEUTROS ABS: 4 10*3/uL (ref 1.7–7.7)
Neutrophils Relative %: 53 % (ref 43–77)
Platelets: 220 10*3/uL (ref 150–400)
RBC: 5.11 MIL/uL (ref 4.22–5.81)
RDW: 14.4 % (ref 11.5–15.5)
WBC: 7.6 10*3/uL (ref 4.0–10.5)

## 2014-05-27 LAB — LIPID PANEL
CHOLESTEROL: 171 mg/dL (ref 0–200)
HDL: 43 mg/dL (ref >39)
LDL Cholesterol: 101 mg/dL — ABNORMAL HIGH (ref 0–99)
TRIGLYCERIDES: 134 mg/dL (ref <150)
Total CHOL/HDL Ratio: 4 Ratio
VLDL: 27 mg/dL (ref 0–40)

## 2014-05-27 LAB — BASIC METABOLIC PANEL
BUN: 16 mg/dL (ref 6–23)
CO2: 27 mEq/L (ref 19–32)
CREATININE: 1.15 mg/dL (ref 0.50–1.35)
Calcium: 9.6 mg/dL (ref 8.4–10.5)
Chloride: 104 mEq/L (ref 96–112)
GLUCOSE: 106 mg/dL — AB (ref 70–99)
Potassium: 4.4 mEq/L (ref 3.5–5.3)
SODIUM: 139 meq/L (ref 135–145)

## 2014-05-27 LAB — URIC ACID: URIC ACID, SERUM: 6.5 mg/dL (ref 4.0–7.8)

## 2014-05-27 MED ORDER — ZOSTER VACCINE LIVE 19400 UNT/0.65ML ~~LOC~~ SOLR: 0.6500 mL | Freq: Once | SUBCUTANEOUS | Status: DC

## 2014-05-27 NOTE — Progress Notes (Signed)
Subjective:  This chart was scribed for Lesle ChrisSteven Ozell Ferrera, MD by Carl Bestelina Holson, Medical Scribe. This patient was seen in Room 23 and the patient's care was started at 9:35 AM.   Patient ID: Louis Bryan, male    DOB: 07/27/1935, 79 y.o.   MRN: [id number]  HPI HPI Comments: Louis Bryan is a 79 y.o. male who presents to the [facility] for a follow-up visit.  He denies chest pain and abdominal pain as associated symptoms.  He takes Allopurinol for gout.  Dr. Elnoria HowardHung has already evaluated him for hematochezia.  He received his flu shot today.   Patient Active Problem List   Diagnosis Date Noted  . Blood in stool 11/26/2013  . Obesity (BMI 30-39.9) 09/29/2013  . Chronotropic incompetence - medication related 09/24/2012  . Hypertension 09/20/2011  . Hyperlipidemia 09/20/2011   Past Medical History  Diagnosis Date  . Hypertension   . Hyperlipidemia   . Shortness of breath     On exertion  . Tobacco abuse   . History of exercise stress test [phone]    negative bruce protocol excercise stress test with scintigraphic evidence of diaphragmatic attenuation and mild apical thinning, dynamic gating was not performed secondary to frequent ectopy, low risk study  . Chronotropic incompetence - medication related 09/24/2012   Past Surgical History  Procedure Laterality Date  . Cholecystectomy N/A 10/27/2012    Procedure: LAPAROSCOPIC CHOLECYSTECTOMY WITH INTRAOPERATIVE CHOLANGIOGRAM;  Surgeon: Romie LeveeAlicia Thomas, MD;  Location: [facility];  Service: General;  Laterality: N/A;   No Known Allergies Prior to Admission medications   Medication Sig Start Date End Date Taking? Authorizing Provider  allopurinol (ZYLOPRIM) 100 MG tablet TAKE 1 TABLET BY MOUTH DAILY 11/26/13  Yes Collene GobbleSteven A Melodie Ashworth, MD  aspirin 81 MG tablet Take 81 mg by mouth daily.   Yes Historical Provider, MD  lisinopril-hydrochlorothiazide (PRINZIDE,ZESTORETIC) 10-12.5 MG per tablet TAKE 1 TABLET BY MOUTH EVERY DAY 11/26/13  Yes Collene GobbleSteven A  Latysha Thackston, MD  metoprolol succinate (TOPROL-XL) 25 MG 24 hr tablet TAKE 1 TABLET BY MOUTH EVERY DAY WITH FOOD 11/26/13  Yes Collene GobbleSteven A Rick Warnick, MD  simvastatin (ZOCOR) 20 MG tablet TAKE ONE TABLET BY MOUTH IN THE EVENING 11/26/13  Yes Collene GobbleSteven A Khaleah Duer, MD  predniSONE (DELTASONE) 10 MG tablet Date 6 for one day 5 for 1 day four for one day 3 for one day 2 for one day one for one day Patient not taking: Reported on 05/27/2014 11/26/13   Collene GobbleSteven A Zymire Turnbo, MD   History   Social History  . Marital Status: Single    Spouse Name: N/A    Number of Children: 1  . Years of Education: N/A   Occupational History  . Retired    Social History Main Topics  . Smoking status: Former Smoker    Quit date: 06/13/1992  . Smokeless tobacco: Never Used  . Alcohol Use: No  . Drug Use: No  . Sexual Activity: Yes   Other Topics Concern  . Not on file   Social History Narrative   He walks about 30 minutes 5-6 days a week. Divorced.     Review of Systems  Cardiovascular: Negative for chest pain.  Gastrointestinal: Negative for abdominal pain.     Objective:  Physical Exam  Constitutional: He is oriented to person, place, and time. He appears well-developed and well-nourished.  HENT:  Head: Normocephalic and atraumatic.  Mouth/Throat: Oropharynx is clear and moist. No oropharyngeal exudate.  Eyes: Conjunctivae and EOM  are normal. Pupils are equal, round, and reactive to light. Right eye exhibits no discharge. Left eye exhibits no discharge. No scleral icterus.  Neck: Normal range of motion. Neck supple. Carotid bruit is not present. No thyromegaly present.  Cardiovascular: Normal rate, regular rhythm and normal heart sounds.  Exam reveals no gallop and no friction rub.   No murmur heard. Pulmonary/Chest: Effort normal and breath sounds normal. No respiratory distress. He has no wheezes. He has no rales.  Musculoskeletal: Normal range of motion.  Lymphadenopathy:    He has no cervical adenopathy.  Neurological:  He is alert and oriented to person, place, and time.  Skin: Skin is warm and dry.  Psychiatric: He has a normal mood and affect. His behavior is normal.  Nursing note and vitals reviewed.    BP 120/80 mmHg  Pulse 72  Temp(Src) 98.1 F (36.7 C) (Oral)  Resp 16  Ht 5' 7.25" (1.708 m)  Wt 195 lb 9.6 oz (88.724 kg)  BMI 30.41 kg/m2  SpO2 95% Assessment & Plan:  Here for follow-up of hyperlipidemia, hypertension, and gout.  Routine labs done today.  Will get a flu shot today.  Recheck in 6 months.I personally performed the services described in this documentation, which was scribed in my presence. The recorded information has been reviewed and is accurate.

## 2014-07-09 ENCOUNTER — Ambulatory Visit (INDEPENDENT_AMBULATORY_CARE_PROVIDER_SITE_OTHER): Payer: Medicare Other | Admitting: Family Medicine

## 2014-07-09 ENCOUNTER — Ambulatory Visit (INDEPENDENT_AMBULATORY_CARE_PROVIDER_SITE_OTHER): Payer: Medicare Other

## 2014-07-09 VITALS — BP 124/88 | HR 100 | Temp 98.0°F | Resp 17 | Ht 67.0 in | Wt 195.0 lb

## 2014-07-09 DIAGNOSIS — R059 Cough, unspecified: Secondary | ICD-10-CM

## 2014-07-09 DIAGNOSIS — R05 Cough: Secondary | ICD-10-CM | POA: Diagnosis not present

## 2014-07-09 DIAGNOSIS — J069 Acute upper respiratory infection, unspecified: Secondary | ICD-10-CM | POA: Diagnosis not present

## 2014-07-09 MED ORDER — BENZONATATE 100 MG PO CAPS: 100.0000 mg | ORAL_CAPSULE | Freq: Three times a day (TID) | ORAL | Status: DC | PRN

## 2014-07-09 MED ORDER — HYDROCODONE-HOMATROPINE 5-1.5 MG/5ML PO SYRP: ORAL_SOLUTION | ORAL | Status: DC

## 2014-07-09 MED ORDER — DOXYCYCLINE HYCLATE 100 MG PO TABS: 100.0000 mg | ORAL_TABLET | Freq: Two times a day (BID) | ORAL | Status: DC

## 2014-07-09 NOTE — Progress Notes (Addendum)
Subjective:    Patient ID: Louis Bryan, male    DOB: 1935/05/26, 79 y.o.   MRN: [id number] This chart was scribed for Louis Staggers, MD by Louis Bryan, Medical Scribe. This patient was seen in Room 9 and the patient's care was started a 3:02 PM.  Chief Complaint  Patient presents with  . URI  . Cough  . Nasal Congestion    HPI HPI Comments: Louis Bryan is a 79 y.o. male with a hx of HTN, chronotropic incompetence and HLD who presents to [facility] complaining of a cough, worse at night, with associated chest congestion and rhinorrhea for the last four days. Louis Bryan denies fever, chills or sick contacts. Louis Bryan had a flu shot six weeks ago. Louis Bryan endorses associated SOB with walking. Louis Bryan does not have COPD or asthma. Louis Bryan states Louis Bryan had pneumonia 5-6 years ago, which was treated outpatient.   Immunization History  Administered Date(s) Administered  . Influenza,inj,Quad PF,36+ Mos 01/22/2013, 05/27/2014  . Pneumococcal Conjugate-13 11/26/2013  . Pneumococcal-Unspecified 09/29/2007  . Tdap 11/10/2009     Patient Active Problem List   Diagnosis Date Noted  . Blood in stool 11/26/2013  . Obesity (BMI 30-39.9) 09/29/2013  . Chronotropic incompetence - medication related 09/24/2012  . Hypertension 09/20/2011  . Hyperlipidemia 09/20/2011   Past Medical History  Diagnosis Date  . Hypertension   . Hyperlipidemia   . Shortness of breath     On exertion  . Tobacco abuse   . History of exercise stress test [phone]    negative bruce protocol excercise stress test with scintigraphic evidence of diaphragmatic attenuation and mild apical thinning, dynamic gating was not performed secondary to frequent ectopy, low risk study  . Chronotropic incompetence - medication related 09/24/2012   Past Surgical History  Procedure Laterality Date  . Cholecystectomy N/A 10/27/2012    Procedure: LAPAROSCOPIC CHOLECYSTECTOMY WITH INTRAOPERATIVE CHOLANGIOGRAM;  Surgeon: Louis Levee, MD;  Location: [facility];  Service: General;   Laterality: N/A;   No Known Allergies Prior to Admission medications   Medication Sig Start Date End Date Taking? Authorizing Provider  allopurinol (ZYLOPRIM) 100 MG tablet TAKE 1 TABLET BY MOUTH DAILY 11/26/13  Yes Collene Gobble, MD  aspirin 81 MG tablet Take 81 mg by mouth daily.   Yes Historical Provider, MD  lisinopril-hydrochlorothiazide (PRINZIDE,ZESTORETIC) 10-12.5 MG per tablet TAKE 1 TABLET BY MOUTH EVERY DAY 11/26/13  Yes Collene Gobble, MD  metoprolol succinate (TOPROL-XL) 25 MG 24 hr tablet TAKE 1 TABLET BY MOUTH EVERY DAY WITH FOOD 11/26/13  Yes Collene Gobble, MD  simvastatin (ZOCOR) 20 MG tablet TAKE ONE TABLET BY MOUTH IN THE EVENING 11/26/13  Yes Collene Gobble, MD  predniSONE (DELTASONE) 10 MG tablet Date 6 for one day 5 for 1 day four for one day 3 for one day 2 for one day one for one day Patient not taking: Reported on 05/27/2014 11/26/13   Collene Gobble, MD   History   Social History  . Marital Status: Single    Spouse Name: N/A  . Number of Children: 1  . Years of Education: N/A   Occupational History  . Retired    Social History Main Topics  . Smoking status: Former Smoker    Quit date: 06/13/1992  . Smokeless tobacco: Never Used  . Alcohol Use: No  . Drug Use: No  . Sexual Activity: Yes   Other Topics Concern  . Not on file   Social History Narrative   Louis Bryan  walks about 30 minutes 5-6 days a week. Divorced.    Review of Systems  Constitutional: Negative for fever and chills.  HENT: Positive for congestion and rhinorrhea.   Respiratory: Positive for cough and shortness of breath.   Cardiovascular: Negative for chest pain.      Objective:   Physical Exam  Constitutional: Louis Bryan is oriented to person, place, and time. Louis Bryan appears well-developed and well-nourished. No distress.  HENT:  Head: Normocephalic and atraumatic.  Right Ear: Tympanic membrane, external ear and ear canal normal.  Left Ear: Tympanic membrane, external ear and ear canal normal.  Nose: No  rhinorrhea.  Mouth/Throat: Oropharynx is clear and moist and mucous membranes are normal. No oropharyngeal exudate or posterior oropharyngeal erythema.  Sinuses non tender.  Eyes: Conjunctivae are normal. Pupils are equal, round, and reactive to light.  Neck: Neck supple.  Cardiovascular: Normal rate, regular rhythm, normal heart sounds and intact distal pulses.   No murmur heard. Pulmonary/Chest: Effort normal and breath sounds normal. Louis Bryan has no wheezes. Louis Bryan has no rhonchi. Louis Bryan has no rales.  Abdominal: Soft. There is no tenderness.  Musculoskeletal: Louis Bryan exhibits no edema.  No edema in legs.  Lymphadenopathy:    Louis Bryan has no cervical adenopathy.  Neurological: Louis Bryan is alert and oriented to person, place, and time.  Skin: Skin is warm and dry. No rash noted.  Psychiatric: Louis Bryan has a normal mood and affect. His behavior is normal.  Vitals reviewed.   Filed Vitals:   07/09/14 1445 07/09/14 1458  BP: 124/88   Pulse: 108 100  Temp: 98 F (36.7 C)   TempSrc: Oral   Resp: 17   Height:  (1.702 m)   Weight: 195 lb (88.451 kg)   SpO2: 95% 95%    [facility] reading (PRIMARY) by Dr. Neva Bryan, chest x-ray. Few increased right middle lobe markings without discrete infiltrate.      Assessment & Plan:   Louis Bryan is a 79 y.o. male URI (upper respiratory infection) - Plan: DG Chest 2 View  Cough - Plan: DG Chest 2 View, doxycycline (VIBRA-TABS) 100 MG tablet, benzonatate (TESSALON) 100 MG capsule, HYDROcodone-homatropine (HYCODAN) 5-1.5 MG/5ML syrup  Suspected viral URI with PND, cough.  Lungs clear on exam, no discrete infiltrate on CXR.   -sx care with tessalon perles, hycodan at night if needed - SED and fall precautions given.  -if not improving in next 5 days - can fill doxycycline (chose this of azithro with his prior cardiac issues).    -rtc precautions discussed.   Meds ordered this encounter  Medications  . doxycycline (VIBRA-TABS) 100 MG tablet    Sig: Take 1 tablet (100 mg total) by  mouth 2 (two) times daily.    Dispense:  20 tablet    Refill:  0  . benzonatate (TESSALON) 100 MG capsule    Sig: Take 1 capsule (100 mg total) by mouth 3 (three) times daily as needed for cough.    Dispense:  20 capsule    Refill:  0  . HYDROcodone-homatropine (HYCODAN) 5-1.5 MG/5ML syrup    Sig: 78m by mouth a bedtime as needed for cough.    Dispense:  120 mL    Refill:  0   Patient Instructions  You likely have a virus that can cause chest congestion, but early bronchitis also possible.   Tessalon perles if needed during the day, and if cough keeping you up at night, can try hydrocodone cough syrup only if needed. This medicine can cause  dizziness adn sedation so be careful taking this medicine as it can increase your risk of falling. (you only need to take this if  Cough is keeping you awake at night). If cough is not improving in the next 5-6 days - can start antibiotic as discussed - twice per day. Return to the clinic or go to the nearest emergency room if any of your symptoms worsen or new symptoms occur.  Cough, Adult  A cough is a reflex that helps clear your throat and airways. It can help heal the body or may be a reaction to an irritated airway. A cough may only last 2 or 3 weeks (acute) or may last more than 8 weeks (chronic).  CAUSES Acute cough:  Viral or bacterial infections. Chronic cough:  Infections.  Allergies.  Asthma.  Post-nasal drip.  Smoking.  Heartburn or acid reflux.  Some medicines.  Chronic lung problems (COPD).  Cancer. SYMPTOMS   Cough.  Fever.  Chest pain.  Increased breathing rate.  High-pitched whistling sound when breathing (wheezing).  Colored mucus that you cough up (sputum). TREATMENT   A bacterial cough may be treated with antibiotic medicine.  A viral cough must run its course and will not respond to antibiotics.  Your caregiver may recommend other treatments if you have a chronic cough. HOME CARE INSTRUCTIONS    Only take over-the-counter or prescription medicines for pain, discomfort, or fever as directed by your caregiver. Use cough suppressants only as directed by your caregiver.  Use a cold steam vaporizer or humidifier in your bedroom or home to help loosen secretions.  Sleep in a semi-upright position if your cough is worse at night.  Rest as needed.  Stop smoking if you smoke. SEEK IMMEDIATE MEDICAL CARE IF:   You have pus in your sputum.  Your cough starts to worsen.  You cannot control your cough with suppressants and are losing sleep.  You begin coughing up blood.  You have difficulty breathing.  You develop pain which is getting worse or is uncontrolled with medicine.  You have a fever. MAKE SURE YOU:   Understand these instructions.  Will watch your condition.  Will get help right away if you are not doing well or get worse. Document Released: 10/22/2010 Document Revised: 07/18/2011 Document Reviewed: 10/22/2010 [facility] Patient Information 2015 [employer] [place]. This information is not intended to replace advice given to you by your health care provider. Make sure you discuss any questions you have with your health care provider.   I personally performed the services described in this documentation, which was scribed in my presence. The recorded information has been reviewed and considered, and addended by me as needed.

## 2014-07-09 NOTE — Patient Instructions (Signed)
You likely have a virus that can cause chest congestion, but early bronchitis also possible.   Tessalon perles if needed during the day, and if cough keeping you up at night, can try hydrocodone cough syrup only if needed. This medicine can cause dizziness adn sedation so be careful taking this medicine as it can increase your risk of falling. (you only need to take this if  Cough is keeping you awake at night). If cough is not improving in the next 5-6 days - can start antibiotic as discussed - twice per day. Return to the clinic or go to the nearest emergency room if any of your symptoms worsen or new symptoms occur.  Cough, Adult  A cough is a reflex that helps clear your throat and airways. It can help heal the body or may be a reaction to an irritated airway. A cough may only last 2 or 3 weeks (acute) or may last more than 8 weeks (chronic).  CAUSES Acute cough:  Viral or bacterial infections. Chronic cough:  Infections.  Allergies.  Asthma.  Post-nasal drip.  Smoking.  Heartburn or acid reflux.  Some medicines.  Chronic lung problems (COPD).  Cancer. SYMPTOMS   Cough.  Fever.  Chest pain.  Increased breathing rate.  High-pitched whistling sound when breathing (wheezing).  Colored mucus that you cough up (sputum). TREATMENT   A bacterial cough may be treated with antibiotic medicine.  A viral cough must run its course and will not respond to antibiotics.  Your caregiver may recommend other treatments if you have a chronic cough. HOME CARE INSTRUCTIONS   Only take over-the-counter or prescription medicines for pain, discomfort, or fever as directed by your caregiver. Use cough suppressants only as directed by your caregiver.  Use a cold steam vaporizer or humidifier in your bedroom or home to help loosen secretions.  Sleep in a semi-upright position if your cough is worse at night.  Rest as needed.  Stop smoking if you smoke. SEEK IMMEDIATE MEDICAL CARE  IF:   You have pus in your sputum.  Your cough starts to worsen.  You cannot control your cough with suppressants and are losing sleep.  You begin coughing up blood.  You have difficulty breathing.  You develop pain which is getting worse or is uncontrolled with medicine.  You have a fever. MAKE SURE YOU:   Understand these instructions.  Will watch your condition.  Will get help right away if you are not doing well or get worse. Document Released: 10/22/2010 Document Revised: 07/18/2011 Document Reviewed: 10/22/2010 [facility] Patient Information 2015 [place] [place]. This information is not intended to replace advice given to you by your health care provider. Make sure you discuss any questions you have with your health care provider.

## 2014-08-30 ENCOUNTER — Ambulatory Visit (INDEPENDENT_AMBULATORY_CARE_PROVIDER_SITE_OTHER): Payer: Medicare Other | Admitting: Family Medicine

## 2014-08-30 VITALS — BP 122/72 | HR 85 | Temp 97.9°F | Resp 16 | Ht 67.0 in | Wt 196.0 lb

## 2014-08-30 DIAGNOSIS — R05 Cough: Secondary | ICD-10-CM | POA: Diagnosis not present

## 2014-08-30 DIAGNOSIS — J209 Acute bronchitis, unspecified: Secondary | ICD-10-CM | POA: Diagnosis not present

## 2014-08-30 DIAGNOSIS — R059 Cough, unspecified: Secondary | ICD-10-CM

## 2014-08-30 MED ORDER — HYDROCODONE-HOMATROPINE 5-1.5 MG/5ML PO SYRP: ORAL_SOLUTION | ORAL | Status: DC

## 2014-08-30 MED ORDER — AZITHROMYCIN 500 MG PO TABS: 500.0000 mg | ORAL_TABLET | Freq: Every day | ORAL | Status: DC

## 2014-08-30 NOTE — Progress Notes (Addendum)
Patient ID: Louis Bryan MRN: [id number], DOB: 1935/08/11, 79 y.o. Date of Encounter: 08/30/2014, 9:02 AM  Primary Physician: Lucilla Edin, MD  Chief Complaint:  Chief Complaint  Patient presents with  . Cough    x 2-3 days  . Nasal Congestion    HPI: 79 y.o. year old male former [employer] presents with a 3 day history of nasal congestion, post nasal drip, sore throat, and cough. Mild sinus pressure. Afebrile. No chills. Nasal congestion thick and green/yellow. Cough is productive of green/yellow sputum and not associated with time of day. Ears feel full, leading to sensation of muffled hearing. Has tried OTC cold preps without success. No GI complaints.   No sick contacts, recent antibiotics, or recent travels.   No leg trauma, sedentary periods, h/o cancer, or tobacco use.  Past Medical History  Diagnosis Date  . Hypertension   . Hyperlipidemia   . Shortness of breath     On exertion  . Tobacco abuse   . History of exercise stress test [phone]    negative bruce protocol excercise stress test with scintigraphic evidence of diaphragmatic attenuation and mild apical thinning, dynamic gating was not performed secondary to frequent ectopy, low risk study  . Chronotropic incompetence - medication related 09/24/2012     Home Meds: Prior to Admission medications   Medication Sig Start Date End Date Taking? Authorizing Provider  allopurinol (ZYLOPRIM) 100 MG tablet TAKE 1 TABLET BY MOUTH DAILY 11/26/13  Yes Collene Gobble, MD  aspirin 81 MG tablet Take 81 mg by mouth daily.   Yes Historical Provider, MD  benzonatate (TESSALON) 100 MG capsule Take 1 capsule (100 mg total) by mouth 3 (three) times daily as needed for cough. 07/09/14  Yes Shade Flood, MD  doxycycline (VIBRA-TABS) 100 MG tablet Take 1 tablet (100 mg total) by mouth 2 (two) times daily. 07/09/14  Yes Shade Flood, MD  HYDROcodone-homatropine [facility]) 5-1.5 MG/5ML syrup 33m by mouth a bedtime as needed for cough.  07/09/14  Yes Shade Flood, MD  lisinopril-hydrochlorothiazide (PRINZIDE,ZESTORETIC) 10-12.5 MG per tablet TAKE 1 TABLET BY MOUTH EVERY DAY 11/26/13  Yes Collene Gobble, MD  metoprolol succinate (TOPROL-XL) 25 MG 24 hr tablet TAKE 1 TABLET BY MOUTH EVERY DAY WITH FOOD 11/26/13  Yes Collene Gobble, MD  simvastatin (ZOCOR) 20 MG tablet TAKE ONE TABLET BY MOUTH IN THE EVENING 11/26/13  Yes Collene Gobble, MD    Allergies: No Known Allergies  History   Social History  . Marital Status: Single    Spouse Name: N/A  . Number of Children: 1  . Years of Education: N/A   Occupational History  . Retired    Social History Main Topics  . Smoking status: Former Smoker    Quit date: 06/13/1992  . Smokeless tobacco: Never Used  . Alcohol Use: No  . Drug Use: No  . Sexual Activity: Yes   Other Topics Concern  . Not on file   Social History Narrative   He walks about 30 minutes 5-6 days a week. Divorced.     Review of Systems: Constitutional: negative for chills, fever, night sweats or weight changes Cardiovascular: negative for chest pain or palpitations Respiratory: negative for hemoptysis, wheezing, or shortness of breath Abdominal: negative for abdominal pain, nausea, vomiting or diarrhea Dermatological: negative for rash Neurologic: negative for headache   Physical Exam: Blood pressure 122/72, pulse 85, temperature 97.9 F (36.6 C), resp. rate 16, height 5'  7" (1.702 m), weight 196 lb (88.905 kg), SpO2 97 %., Body mass index is 30.69 kg/(m^2). General: Well developed, well nourished, in no acute distress. Head: Normocephalic, atraumatic, eyes without discharge, sclera non-icteric, nares are congested. Bilateral auditory canals clear, TM's are without perforation, pearly grey with reflective cone of light bilaterally. No sinus TTP. Oral cavity moist, dentition normal. Posterior pharynx with post nasal drip and mild erythema. No peritonsillar abscess or tonsillar exudate. Neck: Supple.  No thyromegaly. Full ROM. No lymphadenopathy. Lungs: Coarse breath sounds bilaterally without wheezes or rhonchi. Breathing is unlabored. Bibasilar rales present Heart: RRR with S1 S2. No murmurs, rubs, or gallops appreciated. Msk:  Strength and tone normal for age. Extremities: No clubbing or cyanosis. No edema. Neuro: Alert and oriented X 3. Moves all extremities spontaneously. CNII-XII grossly in tact. Psych:  Responds to questions appropriately with a normal affect.     ASSESSMENT AND PLAN:  79 y.o. year old male with bronchitis. - This chart was scribed in my presence and reviewed by me personally.    ICD-9-CM ICD-10-CM   1. Cough 786.2 R05 HYDROcodone-homatropine (HYCODAN) 5-1.5 MG/5ML syrup  2. Acute bronchitis, unspecified organism 466.0 J20.9    z pak   Signed, Elvina SidleKurt Tamani Durney, MD  -Tylenol/Motrin prn -Rest/fluids -RTC precautions -RTC 3-5 days if no improvement  Signed, Elvina SidleKurt Athanasia Stanwood, MD 08/30/2014 9:02 AM

## 2014-08-30 NOTE — Patient Instructions (Signed)

## 2014-09-16 ENCOUNTER — Ambulatory Visit (INDEPENDENT_AMBULATORY_CARE_PROVIDER_SITE_OTHER): Payer: Medicare Other | Admitting: Emergency Medicine

## 2014-09-16 ENCOUNTER — Encounter: Payer: Self-pay | Admitting: Emergency Medicine

## 2014-09-16 VITALS — BP 120/80 | HR 79 | Temp 98.3°F | Resp 16 | Ht 67.0 in | Wt 195.2 lb

## 2014-09-16 DIAGNOSIS — R05 Cough: Secondary | ICD-10-CM | POA: Diagnosis not present

## 2014-09-16 DIAGNOSIS — M109 Gout, unspecified: Secondary | ICD-10-CM | POA: Diagnosis not present

## 2014-09-16 DIAGNOSIS — I10 Essential (primary) hypertension: Secondary | ICD-10-CM

## 2014-09-16 DIAGNOSIS — E785 Hyperlipidemia, unspecified: Secondary | ICD-10-CM | POA: Diagnosis not present

## 2014-09-16 DIAGNOSIS — R059 Cough, unspecified: Secondary | ICD-10-CM

## 2014-09-16 LAB — LIPID PANEL
CHOL/HDL RATIO: 3.7 ratio
Cholesterol: 175 mg/dL (ref 0–200)
HDL: 47 mg/dL (ref >=40)
LDL CALC: 95 mg/dL (ref 0–99)
Triglycerides: 167 mg/dL — ABNORMAL HIGH (ref <150)
VLDL: 33 mg/dL (ref 0–40)

## 2014-09-16 LAB — BASIC METABOLIC PANEL
BUN: 14 mg/dL (ref 6–23)
CO2: 28 meq/L (ref 19–32)
Calcium: 9.6 mg/dL (ref 8.4–10.5)
Chloride: 105 mEq/L (ref 96–112)
Creat: 1.28 mg/dL (ref 0.50–1.35)
Glucose, Bld: 106 mg/dL — ABNORMAL HIGH (ref 70–99)
Potassium: 4.4 mEq/L (ref 3.5–5.3)
SODIUM: 141 meq/L (ref 135–145)

## 2014-09-16 LAB — URIC ACID: Uric Acid, Serum: 6.8 mg/dL (ref 4.0–7.8)

## 2014-09-16 MED ORDER — LOSARTAN POTASSIUM-HCTZ 50-12.5 MG PO TABS: 1.0000 | ORAL_TABLET | Freq: Every day | ORAL | Status: DC

## 2014-09-16 NOTE — Progress Notes (Signed)
Subjective:  This chart was scribed for [name], MD by Elon SpannerGarrett Cook, Medical Scribe. This patient was seen in room 21 and the patient's care was started at 10:01 AM.   Patient ID: Louis Bryan, male    DOB: 11/16/1935, 79 y.o.   MRN: [id number]  HPI HPI Comments: Louis Bryan Howatt is a 79 y.o. male with a history of gout, HTN, HLD.  [facility].    He complains today of a persistent, improving cough marked by pronounced episodes of cough with resolved productivity and intermittent rhinorrhea onset >1 month ago.  [facility] Dr. Neva SeatGreene who performed a CXR and dagnosed the patient with a URI.  [facility] and was seen by Dr. Milus GlazierLauenstein again for similar complaints, diagnosed with bronchitis, and prescribed a z-pack and Hycodan.  He was compliant with the z-pack but never received the Hycodan.  No imaging was done during that visit. Patient is currently prescribed lisinopril for BP management.  Patient denies sick contacts. Patient denies nasal congestion, sore throat, fever.    His sister, Louis Bryan, has MS.     Review of Systems  Constitutional: Negative for fever.  HENT: Positive for rhinorrhea. Negative for congestion and sore throat.   Respiratory: Positive for cough.        Objective:   Physical Exam  Nursing note and vitals reviewed.  CONSTITUTIONAL: Well developed/well nourished HEAD: Normocephalic/atraumatic EYES: EOMI/PERRL ENMT: Mucous membranes moist NECK: supple no meningeal signs SPINE/BACK:entire spine nontender CV: S1/S2 noted, no murmurs/rubs/gallops noted LUNGS: Lungs are clear to auscultation bilaterally, no apparent distress ABDOMEN: soft, nontender, no rebound or guarding, bowel sounds noted throughout abdomen GU:no cva tenderness NEURO: Pt is awake/alert/appropriate, moves all extremitiesx4.  No facial droop.   EXTREMITIES: pulses normal/equal, full ROM SKIN: warm, color normal PSYCH: no  abnormalities of mood noted, alert and oriented to situation  Results for orders placed or performed in visit on 09/16/14  Lipid panel  Result Value Ref Range   Cholesterol 175 0 - 200 mg/dL   Triglycerides 782167 (H) <150 mg/dL   HDL 47 >=95>=40 mg/dL   Total CHOL/HDL Ratio 3.7 Ratio   VLDL 33 0 - 40 mg/dL   LDL Cholesterol 95 0 - 99 mg/dL  Basic metabolic panel  Result Value Ref Range   Sodium 141 135 - 145 mEq/L   Potassium 4.4 3.5 - 5.3 mEq/L   Chloride 105 96 - 112 mEq/L   CO2 28 19 - 32 mEq/L   Glucose, Bld 106 (H) 70 - 99 mg/dL   BUN 14 6 - 23 mg/dL   Creat 6.211.28 3.080.50 - 6.571.35 mg/dL   Calcium 9.6 8.4 - 84.610.5 mg/dL  Uric Acid  Result Value Ref Range   Uric Acid, Serum 6.8 4.0 - 7.8 mg/dL        Assessment & Plan:   1. Gout of multiple sites, unspecified cause, unspecified chronicity  - Uric Acid  2. Hyperlipemia  - Lipid panel  3. Essential hypertension, benign  - Basic metabolic panel - losartan-hydrochlorothiazide (HYZAAR) 50-12.5 MG per tablet; Take 1 I was concerned that possibly his cough was related to his ACE inhibitor and changed him to losartan HCTZ  1tablet by mouth daily.  Dispense: 90 tablet; Refill: 3  4. Cough  I was concerned his cough is secondary to his ACE inhibitor and changed him to an ARB  I personally performed the services described in  this documentation, which was scribed in my presence. The recorded information has been reviewed and is accurate.  Lesle ChrisSteven Bush Murdoch, MD  [facility], [facility]  09/16/2014 4:02 PM

## 2014-09-30 ENCOUNTER — Ambulatory Visit: Payer: Medicare Other | Admitting: Emergency Medicine

## 2014-12-10 ENCOUNTER — Other Ambulatory Visit: Payer: Self-pay | Admitting: Emergency Medicine

## 2014-12-11 ENCOUNTER — Telehealth: Payer: Self-pay

## 2014-12-11 DIAGNOSIS — I10 Essential (primary) hypertension: Secondary | ICD-10-CM

## 2014-12-11 MED ORDER — METOPROLOL SUCCINATE ER 25 MG PO TB24
ORAL_TABLET | ORAL | Status: DC
Start: 2014-12-11 — End: 2015-05-15

## 2014-12-11 NOTE — Telephone Encounter (Signed)
Pharm called. Pt needs a RF of Metoprolol

## 2014-12-11 NOTE — Telephone Encounter (Signed)
Rx sent 

## 2014-12-23 ENCOUNTER — Ambulatory Visit (INDEPENDENT_AMBULATORY_CARE_PROVIDER_SITE_OTHER): Payer: Medicare Other | Admitting: Emergency Medicine

## 2014-12-23 ENCOUNTER — Encounter: Payer: Self-pay | Admitting: Emergency Medicine

## 2014-12-23 VITALS — BP 116/79 | HR 82 | Temp 98.1°F | Resp 16 | Ht 67.0 in | Wt 196.0 lb

## 2014-12-23 DIAGNOSIS — M109 Gout, unspecified: Secondary | ICD-10-CM

## 2014-12-23 DIAGNOSIS — I10 Essential (primary) hypertension: Secondary | ICD-10-CM | POA: Diagnosis not present

## 2014-12-23 DIAGNOSIS — E785 Hyperlipidemia, unspecified: Secondary | ICD-10-CM | POA: Diagnosis not present

## 2014-12-23 NOTE — Progress Notes (Signed)
This chart was scribed for Lesle Chris, MD by Broadus John, Medical Scribe. This patient was seen in Room 23 and the patient's care was started at 11:04 AM.  Chief Complaint:  Chief Complaint  Patient presents with  . Follow-up  . Hypertension    HPI: Louis Bryan is a 80 y.o. male with a medical history of HTN, HLD, and Gout who reports to [facility] today for a 3 month follow up for persistent cough and hypertension.   Pt notes that he has been feeling ok. He does not complain of any medical issues such as chest pain, shortness of breath, or lower extremities edema. He notes that his cough symptoms have much improved since last visit.   Pt is part of a family of 10 children.   Past Medical History  Diagnosis Date  . Hypertension   . Hyperlipidemia   . Shortness of breath     On exertion  . Tobacco abuse   . History of exercise stress test [phone]    negative bruce protocol excercise stress test with scintigraphic evidence of diaphragmatic attenuation and mild apical thinning, dynamic gating was not performed secondary to frequent ectopy, low risk study  . Chronotropic incompetence - medication related 09/24/2012   Past Surgical History  Procedure Laterality Date  . Cholecystectomy N/A 10/27/2012    Procedure: LAPAROSCOPIC CHOLECYSTECTOMY WITH INTRAOPERATIVE CHOLANGIOGRAM;  Surgeon: Romie Levee, MD;  Location: [facility];  Service: General;  Laterality: N/A;   Social History   Social History  . Marital Status: Single    Spouse Name: N/A  . Number of Children: 1  . Years of Education: N/A   Occupational History  . Retired    Social History Main Topics  . Smoking status: Former Smoker    Quit date: 06/13/1992  . Smokeless tobacco: Never Used  . Alcohol Use: No  . Drug Use: No  . Sexual Activity: Yes   Other Topics Concern  . None   Social History Narrative   He walks about 30 minutes 5-6 days a week. Divorced.   Family History  Problem Relation Age of Onset  .  Diabetes Sister   . Diabetes Sister    Allergies  Allergen Reactions  . Lisinopril Cough   Prior to Admission medications   Medication Sig Start Date End Date Taking? Authorizing Provider  allopurinol (ZYLOPRIM) 100 MG tablet TAKE 1 TABLET BY MOUTH EVERY DAY 12/11/14   Collene Gobble, MD  aspirin 81 MG tablet Take 81 mg by mouth daily.    Historical Provider, MD  Docusate Calcium (STOOL SOFTENER PO) Take by mouth Nightly.    Historical Provider, MD  losartan-hydrochlorothiazide (HYZAAR) 50-12.5 MG per tablet Take 1 tablet by mouth daily. 09/16/14   Collene Gobble, MD  metoprolol succinate (TOPROL-XL) 25 MG 24 hr tablet TAKE 1 TABLET BY MOUTH EVERY DAY WITH FOOD 12/11/14   Collene Gobble, MD  simvastatin (ZOCOR) 20 MG tablet TAKE 1 TABLET BY MOUTH EVERY EVENING 12/11/14   Collene Gobble, MD     ROS: The patient denies chest pain, shortness of breath, or lower extremities edema.   All other systems have been reviewed and were otherwise negative with the exception of those mentioned in the HPI and as above.    PHYSICAL EXAM: Filed Vitals:   12/23/14 1027  BP: 116/79  Pulse: 82  Temp: 98.1 F (36.7 C)  Resp: 16   Body mass index is 30.69 kg/(m^2).   General:  Alert, no acute distress HEENT:  Normocephalic, atraumatic, oropharynx patent. Eye: Nonie Hoyer [facility] Cardiovascular:  Regular rate and rhythm, no rubs murmurs or gallops.  No Carotid bruits, radial pulse intact. No pedal edema.  Respiratory: Clear to auscultation bilaterally.  No wheezes, rales, or rhonchi.  No cyanosis, no use of accessory musculature Abdominal: No organomegaly, abdomen is soft and non-tender, positive bowel sounds.  No masses. Musculoskeletal: Gait intact. No edema, tenderness Skin: No rashes. Neurologic: Facial musculature symmetric. Psychiatric: Patient acts appropriately throughout our interaction. Lymphatic: No cervical or submandibular lymphadenopathy Genitourinary/Anorectal: No acute  findings    LABS: Results for orders placed or performed in visit on 09/16/14  Lipid panel  Result Value Ref Range   Cholesterol 175 0 - 200 mg/dL   Triglycerides 409 (H) <150 mg/dL   HDL 47 >=81 mg/dL   Total CHOL/HDL Ratio 3.7 Ratio   VLDL 33 0 - 40 mg/dL   LDL Cholesterol 95 0 - 99 mg/dL  Basic metabolic panel  Result Value Ref Range   Sodium 141 135 - 145 mEq/L   Potassium 4.4 3.5 - 5.3 mEq/L   Chloride 105 96 - 112 mEq/L   CO2 28 19 - 32 mEq/L   Glucose, Bld 106 (H) 70 - 99 mg/dL   BUN 14 6 - 23 mg/dL   Creat 1.91 4.78 - 2.95 mg/dL   Calcium 9.6 8.4 - 62.1 mg/dL  Uric Acid  Result Value Ref Range   Uric Acid, Serum 6.8 4.0 - 7.8 mg/dL     EKG/XRAY:   Primary read interpreted by Dr. Cleta Alberts at [facility].   ASSESSMENT/PLAN:  No blood work done today. His blood work last visit was great. Blood pressure is at goal no changes.  Gross sideeffects, risk and benefits, and alternatives of medications d/w patient. Patient is aware that all medications have potential sideeffects and we are unable to predict every sideeffect or drug-drug interaction that may occur.  Lesle Chris MD 12/23/2014 11:04 AM

## 2015-01-06 ENCOUNTER — Ambulatory Visit: Payer: Medicare Other | Admitting: Emergency Medicine

## 2015-02-10 ENCOUNTER — Encounter: Payer: Self-pay | Admitting: Emergency Medicine

## 2015-04-07 ENCOUNTER — Ambulatory Visit (INDEPENDENT_AMBULATORY_CARE_PROVIDER_SITE_OTHER): Payer: Medicare Other | Admitting: Emergency Medicine

## 2015-04-07 ENCOUNTER — Encounter: Payer: Self-pay | Admitting: Emergency Medicine

## 2015-04-07 VITALS — BP 132/85 | HR 77 | Temp 98.4°F | Resp 16 | Ht 67.0 in | Wt 194.0 lb

## 2015-04-07 DIAGNOSIS — Z23 Encounter for immunization: Secondary | ICD-10-CM | POA: Diagnosis not present

## 2015-04-07 DIAGNOSIS — I10 Essential (primary) hypertension: Secondary | ICD-10-CM | POA: Diagnosis not present

## 2015-04-07 DIAGNOSIS — E785 Hyperlipidemia, unspecified: Secondary | ICD-10-CM

## 2015-04-07 DIAGNOSIS — M1000 Idiopathic gout, unspecified site: Secondary | ICD-10-CM | POA: Diagnosis not present

## 2015-04-07 LAB — CBC WITH DIFFERENTIAL/PLATELET
BASOS ABS: 0 10*3/uL (ref 0.0–0.1)
Basophils Relative: 0 % (ref 0–1)
Eosinophils Absolute: 0.3 10*3/uL (ref 0.0–0.7)
Eosinophils Relative: 3 % (ref 0–5)
HEMATOCRIT: 46 % (ref 39.0–52.0)
Hemoglobin: 15.4 g/dL (ref 13.0–17.0)
LYMPHS ABS: 3.4 10*3/uL (ref 0.7–4.0)
LYMPHS PCT: 38 % (ref 12–46)
MCH: 30.4 pg (ref 26.0–34.0)
MCHC: 33.5 g/dL (ref 30.0–36.0)
MCV: 90.7 fL (ref 78.0–100.0)
MONOS PCT: 10 % (ref 3–12)
MPV: 10.1 fL (ref 8.6–12.4)
Monocytes Absolute: 0.9 10*3/uL (ref 0.1–1.0)
NEUTROS ABS: 4.4 10*3/uL (ref 1.7–7.7)
NEUTROS PCT: 49 % (ref 43–77)
Platelets: 227 10*3/uL (ref 150–400)
RBC: 5.07 MIL/uL (ref 4.22–5.81)
RDW: 14.5 % (ref 11.5–15.5)
WBC: 9 10*3/uL (ref 4.0–10.5)

## 2015-04-07 LAB — LIPID PANEL
CHOLESTEROL: 162 mg/dL (ref 125–200)
HDL: 51 mg/dL (ref >=40)
LDL Cholesterol: 78 mg/dL (ref <130)
Total CHOL/HDL Ratio: 3.2 Ratio (ref <=5.0)
Triglycerides: 166 mg/dL — ABNORMAL HIGH (ref <150)
VLDL: 33 mg/dL — AB (ref <30)

## 2015-04-07 LAB — BASIC METABOLIC PANEL WITH GFR
BUN: 15 mg/dL (ref 7–25)
CALCIUM: 9.7 mg/dL (ref 8.6–10.3)
CHLORIDE: 103 mmol/L (ref 98–110)
CO2: 29 mmol/L (ref 20–31)
Creat: 1.12 mg/dL (ref 0.70–1.18)
GFR, EST NON AFRICAN AMERICAN: 63 mL/min (ref >=60)
GFR, Est African American: 73 mL/min (ref >=60)
GLUCOSE: 96 mg/dL (ref 65–99)
POTASSIUM: 4.4 mmol/L (ref 3.5–5.3)
SODIUM: 140 mmol/L (ref 135–146)

## 2015-04-07 LAB — URIC ACID: URIC ACID, SERUM: 6.5 mg/dL (ref 4.0–7.8)

## 2015-04-07 NOTE — Progress Notes (Signed)
This chart was scribed for Louis ChrisSteven Verneice Caspers, Bryan by Stann Oresung-Kai Tsai, Medical Scribe. This patient was seen in Room 29 and the patient's care was started 12:15 PM.  Chief Complaint:  Chief Complaint  Patient presents with  . Follow-up  . Hypertension    HPI: Louis Bryan is a 79 y.o. male [facility].  Medication Gout He's still taking gout medication. He denies gout flare ups recently; however, he felt something the other day, but this was resolved.  BP He's still taking BP medication.   Immunizations He received his flu shot today.   Past Medical History  Diagnosis Date  . Hypertension   . Hyperlipidemia   . Shortness of breath     On exertion  . Tobacco abuse   . History of exercise stress test [phone]    negative bruce protocol excercise stress test with scintigraphic evidence of diaphragmatic attenuation and mild apical thinning, dynamic gating was not performed secondary to frequent ectopy, low risk study  . Chronotropic incompetence - medication related 09/24/2012   Past Surgical History  Procedure Laterality Date  . Cholecystectomy N/A 10/27/2012    Procedure: LAPAROSCOPIC CHOLECYSTECTOMY WITH INTRAOPERATIVE CHOLANGIOGRAM;  Surgeon: Romie LeveeAlicia Thomas, Bryan;  Location: [facility];  Service: General;  Laterality: N/A;   Social History   Social History  . Marital Status: Single    Spouse Name: N/A  . Number of Children: 1  . Years of Education: N/A   Occupational History  . Retired    Social History Main Topics  . Smoking status: Former Smoker    Quit date: 06/13/1992  . Smokeless tobacco: Never Used  . Alcohol Use: No  . Drug Use: No  . Sexual Activity: Yes   Other Topics Concern  . None   Social History Narrative   He walks about 30 minutes 5-6 days a week. Divorced.   Family History  Problem Relation Age of Onset  . Diabetes Sister   . Diabetes Sister    Allergies  Allergen Reactions  . Lisinopril Cough   Prior to  Admission medications   Medication Sig Start Date End Date Taking? Authorizing Provider  allopurinol (ZYLOPRIM) 100 MG tablet TAKE 1 TABLET BY MOUTH EVERY DAY 12/11/14   Collene GobbleSteven A Kael Keetch, Bryan  aspirin 81 MG tablet Take 81 mg by mouth daily.    Historical Provider, Bryan  Docusate Calcium (STOOL SOFTENER PO) Take by mouth Nightly.    Historical Provider, Bryan  losartan-hydrochlorothiazide (HYZAAR) 50-12.5 MG per tablet Take 1 tablet by mouth daily. 09/16/14   Collene GobbleSteven A Tamar Miano, Bryan  metoprolol succinate (TOPROL-XL) 25 MG 24 hr tablet TAKE 1 TABLET BY MOUTH EVERY DAY WITH FOOD 12/11/14   Collene GobbleSteven A Robyne Matar, Bryan  simvastatin (ZOCOR) 20 MG tablet TAKE 1 TABLET BY MOUTH EVERY EVENING 12/11/14   Collene GobbleSteven A Zoi Devine, Bryan     ROS:  Constitutional: negative for chills, fever, night sweats, weight changes, or fatigue  HEENT: negative for vision changes, hearing loss, congestion, rhinorrhea, ST, epistaxis, or sinus pressure Cardiovascular: negative for chest pain or palpitations Respiratory: negative for hemoptysis, wheezing, shortness of breath, or cough Abdominal: negative for abdominal pain, nausea, vomiting, diarrhea, or constipation Dermatological: negative for rash Neurologic: negative for headache, dizziness, or syncope All other systems reviewed and are otherwise negative with the exception to those above and in the HPI.  PHYSICAL EXAM: Filed Vitals:   04/07/15 1148  BP: 132/85  Pulse: 77  Temp: 98.4  F (36.9 C)  Resp: 16   Body mass index is 30.38 kg/(m^2).   General: Alert, no acute distress HEENT:  Normocephalic, atraumatic, oropharynx patent. Eye: [name] Cardiovascular:  Regular rate and rhythm, no rubs murmurs or gallops.  No Carotid bruits, radial pulse intact. No pedal edema.  Respiratory: Clear to auscultation bilaterally.  No wheezes, rales, or rhonchi.  No cyanosis, no use of accessory musculature Abdominal: No organomegaly, abdomen is soft and non-tender, positive bowel sounds. No  masses. Musculoskeletal: Gait intact. No edema, tenderness Skin: No rashes. Neurologic: Facial musculature symmetric. Psychiatric: Patient acts appropriately throughout our interaction.  Lymphatic: No cervical or submandibular lymphadenopathy Genitourinary/Anorectal: No acute findings BP recheck in room, sitting (right arm): 118/76  LABS:    EKG/XRAY:   Primary read interpreted by Dr. Cleta Alberts at [facility].   ASSESSMENT/PLAN: Blood pressure is at goal. He has had no recent flareups of his gout. He is stable on his current medication regimen. Flu shot was updated.  By signing my name below, I, Stann Ore, attest that this documentation has been prepared under the direction and in the presence of Louis Chris, Bryan. Electronically Signed: Stann Ore, Scribe. 04/07/2015 , 12:15 PM .    Gross sideeffects, risk and benefits, and alternatives of medications d/w patient. Patient is aware that all medications have potential sideeffects and we are unable to predict every sideeffect or drug-drug interaction that may occur.  Louis Bryan 04/07/2015 12:15 PM

## 2015-04-13 ENCOUNTER — Other Ambulatory Visit: Payer: Self-pay | Admitting: Emergency Medicine

## 2015-05-15 ENCOUNTER — Other Ambulatory Visit: Payer: Self-pay | Admitting: Emergency Medicine

## 2015-07-23 ENCOUNTER — Telehealth: Payer: Self-pay

## 2015-07-23 ENCOUNTER — Encounter: Payer: Self-pay | Admitting: Emergency Medicine

## 2015-07-23 ENCOUNTER — Ambulatory Visit (INDEPENDENT_AMBULATORY_CARE_PROVIDER_SITE_OTHER): Payer: Medicare Other | Admitting: Emergency Medicine

## 2015-07-23 ENCOUNTER — Other Ambulatory Visit: Payer: Self-pay | Admitting: Emergency Medicine

## 2015-07-23 VITALS — BP 118/80 | HR 80 | Temp 97.9°F | Resp 16 | Ht 67.25 in | Wt 196.4 lb

## 2015-07-23 DIAGNOSIS — I4589 Other specified conduction disorders: Secondary | ICD-10-CM

## 2015-07-23 DIAGNOSIS — Z0000 Encounter for general adult medical examination without abnormal findings: Secondary | ICD-10-CM | POA: Diagnosis not present

## 2015-07-23 DIAGNOSIS — I10 Essential (primary) hypertension: Secondary | ICD-10-CM

## 2015-07-23 DIAGNOSIS — M1000 Idiopathic gout, unspecified site: Secondary | ICD-10-CM | POA: Diagnosis not present

## 2015-07-23 DIAGNOSIS — Z125 Encounter for screening for malignant neoplasm of prostate: Secondary | ICD-10-CM | POA: Diagnosis not present

## 2015-07-23 DIAGNOSIS — Z1211 Encounter for screening for malignant neoplasm of colon: Secondary | ICD-10-CM

## 2015-07-23 DIAGNOSIS — E785 Hyperlipidemia, unspecified: Secondary | ICD-10-CM

## 2015-07-23 LAB — COMPLETE METABOLIC PANEL WITH GFR
ALBUMIN: 4.3 g/dL (ref 3.6–5.1)
ALK PHOS: 105 U/L (ref 40–115)
ALT: 15 U/L (ref 9–46)
AST: 14 U/L (ref 10–35)
BILIRUBIN TOTAL: 0.8 mg/dL (ref 0.2–1.2)
BUN: 14 mg/dL (ref 7–25)
CO2: 24 mmol/L (ref 20–31)
CREATININE: 1.12 mg/dL (ref 0.70–1.18)
Calcium: 9.3 mg/dL (ref 8.6–10.3)
Chloride: 106 mmol/L (ref 98–110)
GFR, EST AFRICAN AMERICAN: 72 mL/min (ref >=60)
GFR, EST NON AFRICAN AMERICAN: 63 mL/min (ref >=60)
Glucose, Bld: 117 mg/dL — ABNORMAL HIGH (ref 65–99)
Potassium: 3.8 mmol/L (ref 3.5–5.3)
Sodium: 139 mmol/L (ref 135–146)
TOTAL PROTEIN: 7.1 g/dL (ref 6.1–8.1)

## 2015-07-23 LAB — POC MICROSCOPIC URINALYSIS (UMFC): Mucus: ABSENT

## 2015-07-23 LAB — LIPID PANEL
Cholesterol: 162 mg/dL (ref 125–200)
HDL: 54 mg/dL (ref >=40)
LDL Cholesterol: 81 mg/dL (ref <130)
TRIGLYCERIDES: 137 mg/dL (ref <150)
Total CHOL/HDL Ratio: 3 Ratio (ref <=5.0)
VLDL: 27 mg/dL (ref <30)

## 2015-07-23 LAB — CBC WITH DIFFERENTIAL/PLATELET
Basophils Absolute: 0 10*3/uL (ref 0.0–0.1)
Basophils Relative: 0 % (ref 0–1)
EOS PCT: 2 % (ref 0–5)
Eosinophils Absolute: 0.2 10*3/uL (ref 0.0–0.7)
HEMATOCRIT: 44.8 % (ref 39.0–52.0)
Hemoglobin: 15.5 g/dL (ref 13.0–17.0)
LYMPHS ABS: 2.8 10*3/uL (ref 0.7–4.0)
LYMPHS PCT: 30 % (ref 12–46)
MCH: 30.9 pg (ref 26.0–34.0)
MCHC: 34.6 g/dL (ref 30.0–36.0)
MCV: 89.2 fL (ref 78.0–100.0)
MONO ABS: 0.9 10*3/uL (ref 0.1–1.0)
MONOS PCT: 10 % (ref 3–12)
MPV: 9.6 fL (ref 8.6–12.4)
Neutro Abs: 5.4 10*3/uL (ref 1.7–7.7)
Neutrophils Relative %: 58 % (ref 43–77)
Platelets: 191 10*3/uL (ref 150–400)
RBC: 5.02 MIL/uL (ref 4.22–5.81)
RDW: 14.3 % (ref 11.5–15.5)
WBC: 9.3 10*3/uL (ref 4.0–10.5)

## 2015-07-23 LAB — POCT URINALYSIS DIP (MANUAL ENTRY)
BILIRUBIN UA: NEGATIVE
GLUCOSE UA: NEGATIVE
Ketones, POC UA: NEGATIVE
Leukocytes, UA: NEGATIVE
Nitrite, UA: NEGATIVE
Protein Ur, POC: NEGATIVE
RBC UA: NEGATIVE
SPEC GRAV UA: 1.02
Urobilinogen, UA: 0.2
pH, UA: 5.5

## 2015-07-23 LAB — URIC ACID: Uric Acid, Serum: 6.4 mg/dL (ref 4.0–7.8)

## 2015-07-23 LAB — TSH: TSH: 1.46 mIU/L (ref 0.40–4.50)

## 2015-07-23 NOTE — Patient Instructions (Signed)
     IF you received an x-ray today, you will receive an invoice from [facility]. Please contact [facility] at [phone] with questions or concerns regarding your invoice.   IF you received labwork today, you will receive an invoice from [facility]. Please contact [facility] at [phone] with questions or concerns regarding your invoice.   Our billing staff will not be able to assist you with questions regarding bills from these companies.  You will be contacted with the lab results as soon as they are available. The fastest way to get your results is to activate your My Chart account. Instructions are located on the last page of this paperwork. If you have not heard from us regarding the results in 2 weeks, please contact this office.      

## 2015-07-23 NOTE — Telephone Encounter (Signed)
Left message for patient to return call.  Need to know the location of patient's last coloscopy.  The office visit notes state that his last colonoscopy was in 2007, but it does not give the location.  Need this information in order to send his gastroenterology referral to the correct location.

## 2015-07-23 NOTE — Progress Notes (Signed)
Patient ID: Louis Bryan, male   DOB: 03/02/1936, 80 y.o.   MRN: [id number]    By signing my name below, I, Essence Howell, attest that this documentation has been prepared under the direction and in the presence of Collene Gobble, MD Electronically Signed: Charline Bills, ED Scribe 07/23/2015 at 10:20 AM.  Chief Complaint:  Chief Complaint  Patient presents with  . Annual Exam   HPI: Louis Bryan is a 80 y.o. male who reports to [facility] today for an annual exam. Pt states that he is doing well overall. No complaints reported at this visit.   Preventative Maintenance Pt is due for a colonoscopy; his last colonoscopy was November 2007.  Past Medical History  Diagnosis Date  . Hypertension   . Hyperlipidemia   . Shortness of breath     On exertion  . Tobacco abuse   . History of exercise stress test [phone]    negative bruce protocol excercise stress test with scintigraphic evidence of diaphragmatic attenuation and mild apical thinning, dynamic gating was not performed secondary to frequent ectopy, low risk study  . Chronotropic incompetence - medication related 09/24/2012   Past Surgical History  Procedure Laterality Date  . Cholecystectomy N/A 10/27/2012    Procedure: LAPAROSCOPIC CHOLECYSTECTOMY WITH INTRAOPERATIVE CHOLANGIOGRAM;  Surgeon: Romie Levee, MD;  Location: [facility];  Service: General;  Laterality: N/A;   Social History   Social History  . Marital Status: Single    Spouse Name: N/A  . Number of Children: 1  . Years of Education: N/A   Occupational History  . Retired    Social History Main Topics  . Smoking status: Former Smoker    Quit date: 06/13/1992  . Smokeless tobacco: Never Used  . Alcohol Use: No  . Drug Use: No  . Sexual Activity: Yes   Other Topics Concern  . None   Social History Narrative   He walks about 30 minutes 5-6 days a week. Divorced.   Family History  Problem Relation Age of Onset  . Diabetes Sister   . Diabetes Sister    Allergies    Allergen Reactions  . Lisinopril Cough   Prior to Admission medications   Medication Sig Start Date End Date Taking? Authorizing Provider  allopurinol (ZYLOPRIM) 100 MG tablet TAKE 1 TABLET BY MOUTH EVERY DAY 04/14/15   Collene Gobble, MD  aspirin 81 MG tablet Take 81 mg by mouth daily.    Historical Provider, MD  Docusate Calcium (STOOL SOFTENER PO) Take by mouth Nightly.    Historical Provider, MD  losartan-hydrochlorothiazide (HYZAAR) 50-12.5 MG per tablet Take 1 tablet by mouth daily. 09/16/14   Collene Gobble, MD  metoprolol succinate (TOPROL-XL) 25 MG 24 hr tablet TAKE 1 TABLET BY MOUTH EVERY DAY WITH FOOD 05/18/15   Collene Gobble, MD  simvastatin (ZOCOR) 20 MG tablet TAKE 1 TABLET BY MOUTH EVERY EVENING 04/14/15   Collene Gobble, MD   ROS: The patient denies fevers, chills, night sweats, unintentional weight loss, chest pain, palpitations, wheezing, dyspnea on exertion, nausea, vomiting, abdominal pain, dysuria, hematuria, melena, numbness, weakness, or tingling.   All other systems have been reviewed and were otherwise negative with the exception of those mentioned in the HPI and as above.    PHYSICAL EXAM: Filed Vitals:   07/23/15 0922 07/23/15 0928  BP: 130/90 118/80  Pulse: 80   Temp: 97.9 F (36.6 C)   Resp: 16    Body mass index is 30.54 kg/(m^2).  General: Alert, no acute distress HEENT:  Normocephalic, atraumatic, oropharynx patent. Very poor dentition Eye: [facility] Cardiovascular: Grade 1 systolic at the upper sternal border. No Carotid bruits, radial pulse intact. No pedal edema.  Respiratory: Clear to auscultation bilaterally. No wheezes, rales, or rhonchi. No cyanosis, no use of accessory musculature Abdominal: 1 cm reducible umbilical hernia Musculoskeletal: Gait intact. No edema, tenderness GU: Normal prostate Skin: No rashes. Neurologic: Facial musculature symmetric. Psychiatric: Patient acts appropriately throughout our interaction. Lymphatic: No cervical  or submandibular lymphadenopathy  LABS:  EKG/XRAY:   Primary [facility]  ASSESSMENT/PLAN: Physical exam was unremarkable. He is referred for follow-up colonoscopy it has been almost 10 years. His labs were done today. He is up-to-date on his immunizations. Follow-up in 6 months.I personally performed the services described in this documentation, which was scribed in my presence. The recorded information has been reviewed and is accurate. He also is known to have a chronic trophic heart disorder. It does not appear he is seen Dr. Herbie BaltimoreHarding since 2015. Referral placed back to him for his reevaluation.    Gross sideeffects, risk and benefits, and alternatives of medications d/w patient. Patient is aware that all medications have potential sideeffects and we are unable to predict every sideeffect or drug-drug interaction that may occur.  Lesle ChrisSteven Julianne Chamberlin MD 07/23/2015 10:08 AM

## 2015-07-24 LAB — HEMOGLOBIN A1C
HEMOGLOBIN A1C: 6.4 % — AB (ref <5.7)
Mean Plasma Glucose: 137 mg/dL — ABNORMAL HIGH (ref <117)

## 2015-07-24 LAB — PSA, MEDICARE: PSA: 1.31 ng/mL (ref <=4.00)

## 2015-07-24 NOTE — Addendum Note (Signed)
Addended by: Lucia GaskinsHAAS, Torry Istre C on: 07/24/2015 08:51 AM   Modules accepted: Kipp BroodSmartSet

## 2015-08-20 ENCOUNTER — Ambulatory Visit (INDEPENDENT_AMBULATORY_CARE_PROVIDER_SITE_OTHER): Payer: Medicare Other | Admitting: Cardiology

## 2015-08-20 ENCOUNTER — Encounter: Payer: Self-pay | Admitting: Cardiology

## 2015-08-20 VITALS — BP 130/82 | HR 88 | Ht 67.0 in | Wt 196.8 lb

## 2015-08-20 DIAGNOSIS — I10 Essential (primary) hypertension: Secondary | ICD-10-CM

## 2015-08-20 DIAGNOSIS — E785 Hyperlipidemia, unspecified: Secondary | ICD-10-CM

## 2015-08-20 DIAGNOSIS — E669 Obesity, unspecified: Secondary | ICD-10-CM | POA: Diagnosis not present

## 2015-08-20 DIAGNOSIS — I4589 Other specified conduction disorders: Secondary | ICD-10-CM | POA: Diagnosis not present

## 2015-08-20 NOTE — Progress Notes (Signed)
PCP: Lucilla Edin, MD  Clinic Note: Chief Complaint  Patient presents with  . Follow-up  . Foot Pain    cramping in right foot, on medication for gout  . Hypertension    Chronotropic incompetence    HPI: Louis Bryan is a 80 y.o. male with a PMH below who presents today for ~2 yr f/u.   Had a CPET test in 2014 -- difficult to interpret due to inability to meet target HR -- Submaximal effort of 0.84 with goal of 1.09. . BB dose decreased.  Loyd Marhefka was last seen in May 2015 - ? Chronotropic incompetence. Below otherwise is doing well. He had not noticed any issues with worsening dyspnea on exertion.   Recent Hospitalizations: N/A  Studies Reviewed: none  Interval History: Overall doing well with no cardiac complaints.  Still walks up to a mile when he gets a chance - 2-3 / week.  No problem with energy, just motivation. No recent illness.  No chest pain or pressure with exertion, but a "little DOE" especially with walking up hills, so he wlll take a break. No PND, orthopnea or edema.  No palpitations, lightheadedness, dizziness, weakness or syncope/near syncope. No TIA/amaurosis fugax symptoms. No claudication.  ROS: A comprehensive was performed. Review of Systems  Constitutional: Negative for fever, chills and malaise/fatigue.  HENT: Negative for nosebleeds.   Eyes: Negative for blurred vision.  Respiratory: Negative for cough (only with allergies or if he has a cold), shortness of breath (only with significant exertion) and wheezing.   Cardiovascular: Negative for claudication.       Per HPI  Gastrointestinal: Negative for blood in stool and melena.  Genitourinary: Negative for hematuria.  Musculoskeletal: Positive for joint pain (R ankle - foot gouty flare.).  Neurological: Negative for weakness and headaches.  Endo/Heme/Allergies: Does not bruise/bleed easily.  Psychiatric/Behavioral: Negative for depression. The patient is not nervous/anxious.   All other  systems reviewed and are negative.    Past Medical History  Diagnosis Date  . Hypertension   . Hyperlipidemia   . Shortness of breath     On exertion  . Tobacco abuse   . History of exercise stress test [phone]    negative bruce protocol excercise stress test with scintigraphic evidence of diaphragmatic attenuation and mild apical thinning, dynamic gating was not performed secondary to frequent ectopy, low risk study  . Chronotropic incompetence - medication related 09/24/2012    Past Surgical History  Procedure Laterality Date  . Cholecystectomy N/A 10/27/2012    Procedure: LAPAROSCOPIC CHOLECYSTECTOMY WITH INTRAOPERATIVE CHOLANGIOGRAM;  Surgeon: Romie Levee, MD;  Location: [facility];  Service: General;  Laterality: N/A;    Prior to Admission medications   Medication Sig Start Date End Date Taking? Authorizing Provider  allopurinol (ZYLOPRIM) 100 MG tablet TAKE 1 TABLET BY MOUTH EVERY DAY 04/14/15   Collene Gobble, MD  aspirin 81 MG tablet Take 81 mg by mouth daily.    Historical Provider, MD  Docusate Calcium (STOOL SOFTENER PO) Take by mouth Nightly.    Historical Provider, MD  losartan-hydrochlorothiazide (HYZAAR) 50-12.5 MG per tablet Take 1 tablet by mouth daily. 09/16/14   Collene Gobble, MD  metoprolol succinate (TOPROL-XL) 25 MG 24 hr tablet TAKE 1 TABLET BY MOUTH EVERY DAY WITH FOOD 05/18/15   Collene Gobble, MD  simvastatin (ZOCOR) 20 MG tablet TAKE 1 TABLET BY MOUTH EVERY EVENING 04/14/15   Collene Gobble, MD   Allergies  Allergen Reactions  .  Lisinopril Cough    Social History   Social History  . Marital Status: Single    Spouse Name: N/A  . Number of Children: 1  . Years of Education: N/A   Occupational History  . Retired    Social History Main Topics  . Smoking status: Former Smoker    Quit date: 06/13/1992  . Smokeless tobacco: Never Used  . Alcohol Use: No  . Drug Use: No  . Sexual Activity: Yes   Other Topics Concern  . None   Social History Narrative    He walks about 30 minutes 5-6 days a week. Divorced.   Family History  Problem Relation Age of Onset  . Diabetes Sister   . Diabetes Sister      Wt Readings from Last 3 Encounters:  08/20/15 196 lb 12.8 oz (89.268 kg)  07/23/15 196 lb 6.4 oz (89.086 kg)  04/07/15 194 lb (87.998 kg)    PHYSICAL EXAM BP 130/82 mmHg  Pulse 88  Ht 5\' 7"  (1.702 m)  Wt 196 lb 12.8 oz (89.268 kg)  BMI 30.82 kg/m2 General appearance: alert, cooperative, appears stated age, no distress and mildly obese Neck: no adenopathy, no carotid bruit and no JVD Lungs: CTAB, normal percussion bilaterally and non-labored; mild end expiratory wheeze during forced expiration Heart: RRR, S1, S2 normal, no murmur, click, rub or gallop ; non-displaced PMI Abdomen: soft, non-tender; bowel sounds normal; no masses, no organomegaly; mild truncal obesity Extremities: extremities normal, atraumatic, no cyanosis or edema Pulses: 2+ and symmetric; Skin: normal or no rash or lesions Neurologic: Mental status: Alert, oriented, thought content appropriate Cranial nerves: normal (II-XII grossly intact)    Adult ECG Report n/a  Other studies Reviewed: Additional studies/ records that were reviewed today include:  Recent Labs:  Look Good. Lab Results  Component Value Date   CHOL 162 07/23/2015   HDL 54 07/23/2015   LDLCALC 81 07/23/2015   TRIG 137 07/23/2015   CHOLHDL 3.0 07/23/2015    ASSESSMENT / PLAN: Problem List Items Addressed This Visit    Obesity (BMI 30-39.9) (Chronic)    He is doing a good job with exercise, just simply needs to monitor his diet.      Hyperlipidemia - Primary (Chronic)    Recent labs look great. Doing well on low-dose simvastatin. Being followed by PCP regularly.      Essential hypertension (Chronic)    Gray blood pressure control on current meds. Stable dose of ARB/HCTZ and Toprol. Apparently was converted from ACE inhibitor to ARB plus HCTZ.      Chronotropic incompetence -  medication related (Chronic)    Much better on lower dose of Toprol. No significant exertional dyspnea. Since he is doing so well, I decided that we don't necessarily need to check a GXT to assess his rate responsiveness. Albumin next step if he does note worsening exertional dyspnea /fatigue.         Current medicines are reviewed at length with the patient today. (+/- concerns) none The following changes have been made: none Studies Ordered:   No orders of the defined types were placed in this encounter.     ROV 2 yrs    HARDING, Piedad ClimesAVID W, M.D., M.S. Interventional Cardiologist   Pager # [phone] Phone # [phone] [address] [place], [place] [address]

## 2015-08-20 NOTE — Patient Instructions (Signed)
NO CHANGES WITH CURRENT MEDICATIONS  Your physician wants you to follow-up in 24 MONTHS WITH DR HARDING. You will receive a reminder letter in the mail two months in advance. If you don't receive a letter, please call our office to schedule the follow-up appointment.    If you need a refill on your cardiac medications before your next appointment, please call your pharmacy.  

## 2015-08-22 ENCOUNTER — Encounter: Payer: Self-pay | Admitting: Cardiology

## 2015-08-22 NOTE — Assessment & Plan Note (Signed)
He is doing a good job with exercise, just simply needs to monitor his diet.

## 2015-08-22 NOTE — Assessment & Plan Note (Signed)
Gray blood pressure control on current meds. Stable dose of ARB/HCTZ and Toprol. Apparently was converted from ACE inhibitor to ARB plus HCTZ.

## 2015-08-22 NOTE — Assessment & Plan Note (Signed)
Recent labs look great. Doing well on low-dose simvastatin. Being followed by PCP regularly.

## 2015-08-22 NOTE — Assessment & Plan Note (Signed)
Much better on lower dose of Toprol. No significant exertional dyspnea. Since he is doing so well, I decided that we don't necessarily need to check a GXT to assess his rate responsiveness. Albumin next step if he does note worsening exertional dyspnea /fatigue.

## 2015-09-15 ENCOUNTER — Ambulatory Visit (INDEPENDENT_AMBULATORY_CARE_PROVIDER_SITE_OTHER): Payer: Medicare Other

## 2015-09-15 ENCOUNTER — Ambulatory Visit (INDEPENDENT_AMBULATORY_CARE_PROVIDER_SITE_OTHER): Payer: Medicare Other | Admitting: Emergency Medicine

## 2015-09-15 VITALS — BP 122/80 | HR 80 | Temp 98.7°F | Resp 18 | Ht 67.25 in | Wt 196.0 lb

## 2015-09-15 DIAGNOSIS — R05 Cough: Secondary | ICD-10-CM

## 2015-09-15 DIAGNOSIS — R062 Wheezing: Secondary | ICD-10-CM

## 2015-09-15 DIAGNOSIS — J189 Pneumonia, unspecified organism: Secondary | ICD-10-CM

## 2015-09-15 DIAGNOSIS — R059 Cough, unspecified: Secondary | ICD-10-CM

## 2015-09-15 LAB — POCT CBC
GRANULOCYTE PERCENT: 73 % (ref 37–80)
HEMATOCRIT: 41.3 % — AB (ref 43.5–53.7)
HEMOGLOBIN: 14.7 g/dL (ref 14.1–18.1)
Lymph, poc: 2.3 (ref 0.6–3.4)
MCH: 31.7 pg — AB (ref 27–31.2)
MCHC: 35.6 g/dL — AB (ref 31.8–35.4)
MCV: 89.2 fL (ref 80–97)
MID (cbc): 1.1 — AB (ref 0–0.9)
MPV: 7.2 fL (ref 0–99.8)
PLATELET COUNT, POC: 211 10*3/uL (ref 142–424)
POC GRANULOCYTE: 9.1 — AB (ref 2–6.9)
POC LYMPH %: 18.2 % (ref 10–50)
POC MID %: 8.8 %M (ref 0–12)
RBC: 4.63 M/uL — AB (ref 4.69–6.13)
RDW, POC: 14.7 %
WBC: 12.4 10*3/uL — AB (ref 4.6–10.2)

## 2015-09-15 MED ORDER — AZITHROMYCIN 250 MG PO TABS: ORAL_TABLET | ORAL | Status: DC

## 2015-09-15 MED ORDER — CEFTRIAXONE SODIUM 1 G IJ SOLR
1.0000 g | Freq: Once | INTRAMUSCULAR | Status: AC
  Administered 2015-09-15: 1 g via INTRAMUSCULAR

## 2015-09-15 NOTE — Progress Notes (Signed)
By signing my name below, I, Mesha Guinyard, attest that this documentation has been prepared under the direction and in the presence of Lesle ChrisSteven Lafawn Lenoir, MD.  Electronically Signed: Arvilla MarketMesha Guinyard, Medical Scribe. 09/15/2015. 8:29 AM.  Chief Complaint:  Chief Complaint  Patient presents with  . Cough   HPI: Louis Bryan is a 80 y.o. male who [facility] complaining of a productive cough with a brownish sputum that began 3 days ago. Pt mentions he coughs every once in a while. Pt reports his daughter was coughing alot at home and went to the ED 4 days ago. Pt has PMHx of seasonal allergies that triggers rihnorrhea. Pt denies fever, and wheezing. Hx of smoking for 20 years, but he quit before he retired.  Past Medical History  Diagnosis Date  . Hypertension   . Hyperlipidemia   . Shortness of breath     On exertion  . Tobacco abuse   . History of exercise stress test [phone]    negative bruce protocol excercise stress test with scintigraphic evidence of diaphragmatic attenuation and mild apical thinning, dynamic gating was not performed secondary to frequent ectopy, low risk study  . Chronotropic incompetence - medication related 09/24/2012   Past Surgical History  Procedure Laterality Date  . Cholecystectomy N/A 10/27/2012    Procedure: LAPAROSCOPIC CHOLECYSTECTOMY WITH INTRAOPERATIVE CHOLANGIOGRAM;  Surgeon: Romie LeveeAlicia Thomas, MD;  Location: [facility];  Service: General;  Laterality: N/A;   Social History   Social History  . Marital Status: Single    Spouse Name: N/A  . Number of Children: 1  . Years of Education: N/A   Occupational History  . Retired    Social History Main Topics  . Smoking status: Former Smoker    Quit date: 06/13/1992  . Smokeless tobacco: Never Used  . Alcohol Use: No  . Drug Use: No  . Sexual Activity: Yes   Other Topics Concern  . None   Social History Narrative   He walks about 30 minutes 5-6 days a week. Divorced.   Family History  Problem  Relation Age of Onset  . Diabetes Sister   . Diabetes Sister    Allergies  Allergen Reactions  . Lisinopril Cough   Prior to Admission medications   Medication Sig Start Date End Date Taking? Authorizing Provider  allopurinol (ZYLOPRIM) 100 MG tablet TAKE 1 TABLET BY MOUTH EVERY DAY 04/14/15  Yes Collene GobbleSteven A Fayetta Sorenson, MD  aspirin 81 MG tablet Take 81 mg by mouth daily.   Yes Historical Provider, MD  Docusate Calcium (STOOL SOFTENER PO) Take by mouth Nightly.   Yes Historical Provider, MD  losartan-hydrochlorothiazide (HYZAAR) 50-12.5 MG per tablet Take 1 tablet by mouth daily. 09/16/14  Yes Collene GobbleSteven A Wilhemenia Camba, MD  metoprolol succinate (TOPROL-XL) 25 MG 24 hr tablet TAKE 1 TABLET BY MOUTH EVERY DAY WITH FOOD 05/18/15  Yes Collene GobbleSteven A Coben Godshall, MD  simvastatin (ZOCOR) 20 MG tablet TAKE 1 TABLET BY MOUTH EVERY EVENING 04/14/15  Yes Collene GobbleSteven A Kyonna Frier, MD     ROS: The patient denies fevers, chills, night sweats, unintentional weight loss, chest pain, palpitations, wheezing, dyspnea on exertion, nausea, vomiting, abdominal pain, dysuria, hematuria, melena, numbness, weakness, or tingling.  All other systems have been reviewed and were otherwise negative with the exception of those mentioned in the HPI and as above.    PHYSICAL EXAM: Filed Vitals:   09/15/15 0824  BP: 122/80  Pulse: 80  Temp: 98.7 F (37.1 C)  Resp: 18  Body mass index is 30.48 kg/(m^2).   General: Alert, no acute distress HEENT:  Normocephalic, atraumatic, oropharynx patent. Ears nl Eye: EOMI, [facility] Cardiovascular:  Regular rate and rhythm, no rubs murmurs or gallops.  No Carotid bruits, radial pulse intact. No pedal edema.  Respiratory: Clear to auscultation bilaterally.  No wheezes, rales, or rhonchi.  No cyanosis, no use of accessory musculature. Occasional and end extpiratory wheezes Abdominal: No organomegaly, abdomen is soft and non-tender, positive bowel sounds.  No masses. Musculoskeletal: Gait intact. No edema, tenderness Skin:  No rashes. Neurologic: Facial musculature symmetric. Psychiatric: Patient acts appropriately throughout our interaction. Lymphatic: No cervical or submandibular lymphadenopathy  LABS: Results for orders placed or performed in visit on 09/15/15  POCT CBC  Result Value Ref Range   WBC 12.4 (A) 4.6 - 10.2 K/uL   Lymph, poc 2.3 0.6 - 3.4   POC LYMPH PERCENT 18.2 10 - 50 %L   MID (cbc) 1.1 (A) 0 - 0.9   POC MID % 8.8 0 - 12 %M   POC Granulocyte 9.1 (A) 2 - 6.9   Granulocyte percent 73.0 37 - 80 %G   RBC 4.63 (A) 4.69 - 6.13 M/uL   Hemoglobin 14.7 14.1 - 18.1 g/dL   HCT, POC 96.0 (A) 45.4 - 53.7 %   MCV 89.2 80 - 97 fL   MCH, POC 31.7 (A) 27 - 31.2 pg   MCHC 35.6 (A) 31.8 - 35.4 g/dL   RDW, POC 09.8 %   Platelet Count, POC 211 142 - 424 K/uL   MPV 7.2 0 - 99.8 fL   EKG/XRAY:   Primary read interpreted by Dr. Cleta Alberts at [facility].  Dg Chest 2 View  09/15/2015  CLINICAL DATA:  Cough for 4 days, wheezing EXAM: CHEST  2 VIEW COMPARISON:  07/09/2014 FINDINGS: Cardiomediastinal silhouette is stable. Mild elevation of the right hemidiaphragm. There is streaky left base retrocardiac atelectasis or infiltrate. No pulmonary edema. Mild degenerative changes thoracic spine. IMPRESSION: Mild elevation of the right hemidiaphragm. Streaky left base retrocardiac atelectasis or infiltrate. No pulmonary edema. Mild degenerative changes thoracic spine. Electronically Signed   By: Natasha Mead M.D.   On: 09/15/2015 09:09   ASSESSMENT/PLAN: Patient appears to have a patchy left lower lobe pneumonia. White count is elevated. We'll go ahead and give him a gram of Rocephin followed by a Z-Pak. We'll recheck on Friday to be sure he is improving.I personally performed the services described in this documentation, which was scribed in my presence. The recorded information has been reviewed and is accurate.    Gross sideeffects, risk and benefits, and alternatives of medications d/w patient. Patient is aware that all  medications have potential sideeffects and we are unable to predict every sideeffect or drug-drug interaction that may occur.  Lesle Chris MD 09/15/2015 8:29 AM

## 2015-09-15 NOTE — Patient Instructions (Addendum)
   IF you received an x-ray today, you will receive an invoice from [facility]. Please contact [facility] at [phone] with questions or concerns regarding your invoice.   IF you received labwork today, you will receive an invoice from [facility]. Please contact [facility] at [phone] with questions or concerns regarding your invoice.   Our billing staff will not be able to assist you with questions regarding bills from these companies.  You will be contacted with the lab results as soon as they are available. The fastest way to get your results is to activate your My Chart account. Instructions are located on the last page of this paperwork. If you have not heard from us regarding the results in 2 weeks, please contact this office.      Community-Acquired Pneumonia, Adult Pneumonia is an infection of the lungs. There are different types of pneumonia. One type can develop while a person is in a hospital. A different type, called community-acquired pneumonia, develops in people who are not, or have not recently been, in the hospital or other health care facility.  CAUSES Pneumonia may be caused by bacteria, viruses, or funguses. Community-acquired pneumonia is often caused by Streptococcus pneumonia bacteria. These bacteria are often passed from one person to another by breathing in droplets from the cough or sneeze of an infected person. RISK FACTORS The condition is more likely to develop in:  People who havechronic diseases, such as chronic obstructive pulmonary disease (COPD), asthma, congestive heart failure, cystic fibrosis, diabetes, or kidney disease.  People who haveearly-stage or late-stage HIV.  People who havesickle cell disease.  People who havehad their spleen removed (splenectomy).  People who havepoor dental hygiene.  People who havemedical conditions that increase the risk of breathing in (aspirating)  secretions their own mouth and nose.   People who havea weakened immune system (immunocompromised).  People who smoke.  People whotravel to areas where pneumonia-causing germs commonly exist.  People whoare around animal habitats or animals that have pneumonia-causing germs, including birds, bats, rabbits, cats, and farm animals. SYMPTOMS Symptoms of this condition include:  Adry cough.  A wet (productive) cough.  Fever.  Sweating.  Chest pain, especially when breathing deeply or coughing.  Rapid breathing or difficulty breathing.  Shortness of breath.  Shaking chills.  Fatigue.  Muscle aches. DIAGNOSIS Your health care provider will take a medical history and perform a physical exam. You may also have other tests, including:  Imaging studies of your chest, including X-rays.  Tests to check your blood oxygen level and other blood gases.  Other tests on blood, mucus (sputum), fluid around your lungs (pleural fluid), and urine. If your pneumonia is severe, other tests may be done to identify the specific cause of your illness. TREATMENT The type of treatment that you receive depends on many factors, such as the cause of your pneumonia, the medicines you take, and other medical conditions that you have. For most adults, treatment and recovery from pneumonia may occur at home. In some cases, treatment must happen in a hospital. Treatment may include:  Antibiotic medicines, if the pneumonia was caused by bacteria.  Antiviral medicines, if the pneumonia was caused by a virus.  Medicines that are given by mouth or through an IV tube.  Oxygen.  Respiratory therapy. Although rare, treating severe pneumonia may include:  Mechanical ventilation. This is done if you are not breathing well on your own and you cannot maintain a safe blood oxygen level.    Thoracentesis. This procedureremoves fluid around one lung or both lungs to help you breathe better. HOME CARE  INSTRUCTIONS  Take over-the-counter and prescription medicines only as told by your health care provider.  Only takecough medicine if you are losing sleep. Understand that cough medicine can prevent your body's natural ability to remove mucus from your lungs.  If you were prescribed an antibiotic medicine, take it as told by your health care provider. Do not stop taking the antibiotic even if you start to feel better.  Sleep in a semi-upright position at night. Try sleeping in a reclining chair, or place a few pillows under your head.  Do not use tobacco products, including cigarettes, chewing tobacco, and e-cigarettes. If you need help quitting, ask your health care provider.  Drink enough water to keep your urine clear or pale yellow. This will help to thin out mucus secretions in your lungs. PREVENTION There are ways that you can decrease your risk of developing community-acquired pneumonia. Consider getting a pneumococcal vaccine if:  You are older than 78 years of age.  You are older than 78 years of age and are undergoing cancer treatment, have chronic lung disease, or have other medical conditions that affect your immune system. Ask your health care provider if this applies to you. There are different types and schedules of pneumococcal vaccines. Ask your health care provider which vaccination option is best for you. You may also prevent community-acquired pneumonia if you take these actions:  Get an influenza vaccine every year. Ask your health care provider which type of influenza vaccine is best for you.  Go to the dentist on a regular basis.  Wash your hands often. Use hand sanitizer if soap and water are not available. SEEK MEDICAL CARE IF:  You have a fever.  You are losing sleep because you cannot control your cough with cough medicine. SEEK IMMEDIATE MEDICAL CARE IF:  You have worsening shortness of breath.  You have increased chest pain.  Your sickness becomes  worse, especially if you are an older adult or have a weakened immune system.  You cough up blood.   This information is not intended to replace advice given to you by your health care provider. Make sure you discuss any questions you have with your health care provider.   Document Released: 04/25/2005 Document Revised: 01/14/2015 Document Reviewed: 08/20/2014 Elsevier Interactive Patient Education 2016 Elsevier Inc.  

## 2015-09-18 ENCOUNTER — Ambulatory Visit (INDEPENDENT_AMBULATORY_CARE_PROVIDER_SITE_OTHER): Payer: Medicare Other | Admitting: Emergency Medicine

## 2015-09-18 VITALS — BP 128/86 | HR 87 | Temp 98.1°F | Resp 16 | Ht 67.0 in | Wt 190.0 lb

## 2015-09-18 DIAGNOSIS — R059 Cough, unspecified: Secondary | ICD-10-CM

## 2015-09-18 DIAGNOSIS — J189 Pneumonia, unspecified organism: Secondary | ICD-10-CM

## 2015-09-18 DIAGNOSIS — R05 Cough: Secondary | ICD-10-CM

## 2015-09-18 LAB — POCT CBC
Granulocyte percent: 58.4 %G (ref 37–80)
HEMATOCRIT: 42.1 % — AB (ref 43.5–53.7)
Hemoglobin: 14.7 g/dL (ref 14.1–18.1)
LYMPH, POC: 2.6 (ref 0.6–3.4)
MCH, POC: 31.4 pg — AB (ref 27–31.2)
MCHC: 34.9 g/dL (ref 31.8–35.4)
MCV: 90.1 fL (ref 80–97)
MID (cbc): 0.7 (ref 0–0.9)
MPV: 7.2 fL (ref 0–99.8)
POC GRANULOCYTE: 4.7 (ref 2–6.9)
POC LYMPH %: 33 % (ref 10–50)
POC MID %: 8.6 % (ref 0–12)
Platelet Count, POC: 228 10*3/uL (ref 142–424)
RBC: 4.67 M/uL — AB (ref 4.69–6.13)
RDW, POC: 15 %
WBC: 8 10*3/uL (ref 4.6–10.2)

## 2015-09-18 MED ORDER — HYDROCODONE-HOMATROPINE 5-1.5 MG/5ML PO SYRP: 5.0000 mL | ORAL_SOLUTION | Freq: Three times a day (TID) | ORAL | Status: DC | PRN

## 2015-09-18 NOTE — Progress Notes (Signed)
Subjective:  This chart was scribed for Louis ChrisSteven Daub MD, by Louis Bryan,scribe, at [facility].  This patient was seen in room 13 and the patient's care was started at 9:03 AM.   Chief Complaint  Patient presents with  . Follow-up    COUGH WITH BROWNISH MUCUS     Patient ID: Louis Bryan Delpriore, male    DOB: 08/29/1935, 80 y.o.   MRN: [id number]  HPI  HPI Comments: Louis Bryan is a 80 y.o. male who presents to the [facility] for a follow up regarding his pneumonia (infiltrate (in the left base with elevation of the right diaphrgm was revealed on chest x-ray, white count elevated 12,400- 3 days ago).  Patient states that he no longer feels weak but cant seem to get rid of his productive cough (brown mucous) which is worse at night time. He has associated symptoms of shortness of breath intermittently. Patient has been compliant with his antibiotics (Azithromax- 2 left. He has no other complaints today.   Patient Active Problem List   Diagnosis Date Noted  . Blood in stool 11/26/2013  . Obesity (BMI 30-39.9) 09/29/2013  . Chronotropic incompetence - medication related 09/24/2012  . Essential hypertension 09/20/2011  . Hyperlipidemia 09/20/2011   Past Medical History  Diagnosis Date  . Hypertension   . Hyperlipidemia   . Shortness of breath     On exertion  . Tobacco abuse   . History of exercise stress test [phone]    negative bruce protocol excercise stress test with scintigraphic evidence of diaphragmatic attenuation and mild apical thinning, dynamic gating was not performed secondary to frequent ectopy, low risk study  . Chronotropic incompetence - medication related 09/24/2012   Past Surgical History  Procedure Laterality Date  . Cholecystectomy N/A 10/27/2012    Procedure: LAPAROSCOPIC CHOLECYSTECTOMY WITH INTRAOPERATIVE CHOLANGIOGRAM;  Surgeon: Romie LeveeAlicia Thomas, MD;  Location: [facility];  Service: General;  Laterality: N/A;   Allergies  Allergen  Reactions  . Lisinopril Cough   Prior to Admission medications   Medication Sig Start Date End Date Taking? Authorizing Provider  allopurinol (ZYLOPRIM) 100 MG tablet TAKE 1 TABLET BY MOUTH EVERY DAY 04/14/15  Yes Collene GobbleSteven A Daub, MD  aspirin 81 MG tablet Take 81 mg by mouth daily.   Yes Historical Provider, MD  azithromycin (ZITHROMAX) 250 MG tablet Take 2 tabs PO x 1 dose, then 1 tab PO QD x 4 days 09/15/15  Yes Collene GobbleSteven A Daub, MD  Docusate Calcium (STOOL SOFTENER PO) Take by mouth Nightly.   Yes Historical Provider, MD  losartan-hydrochlorothiazide (HYZAAR) 50-12.5 MG per tablet Take 1 tablet by mouth daily. 09/16/14  Yes Collene GobbleSteven A Daub, MD  metoprolol succinate (TOPROL-XL) 25 MG 24 hr tablet TAKE 1 TABLET BY MOUTH EVERY DAY WITH FOOD 05/18/15  Yes Collene GobbleSteven A Daub, MD  simvastatin (ZOCOR) 20 MG tablet TAKE 1 TABLET BY MOUTH EVERY EVENING 04/14/15  Yes Collene GobbleSteven A Daub, MD   Social History   Social History  . Marital Status: Single    Spouse Name: N/A  . Number of Children: 1  . Years of Education: N/A   Occupational History  . Retired    Social History Main Topics  . Smoking status: Former Smoker    Quit date: 06/13/1992  . Smokeless tobacco: Never Used  . Alcohol Use: No  . Drug Use: No  . Sexual Activity: Yes   Other Topics Concern  . Not on file   Social  History Narrative   He walks about 30 minutes 5-6 days a week. Divorced.          Review of Systems  Constitutional: Negative for fever and chills.  Eyes: Negative for photophobia, pain and redness.  Respiratory: Positive for cough and shortness of breath. Negative for choking.   Gastrointestinal: Negative for nausea and vomiting.  Musculoskeletal: Negative for neck pain and neck stiffness.  Skin: Negative for color change.  Neurological: Negative for seizures, syncope, speech difficulty and weakness.       Objective:   Physical Exam  Filed Vitals:   09/18/15 0840  BP: 128/86  Pulse: 87  Temp: 98.1 F (36.7 C)    TempSrc: Oral  Resp: 16  Height:  (1.702 m)  Weight: 190 lb (86.183 kg)  SpO2: 95%    CONSTITUTIONAL:He did not appear acutely ill.  HEAD: Normocephalic/atraumatic EYES: EOMI/PERRL SPINE/BACK:entire spine nontender CV: S1/S2 noted, no murmurs/rubs/gallops noted LUNGS: Lungs are clear, no rales or dullness.  SKIN: warm, color normal PSYCH: no abnormalities of mood noted, alert and oriented to situation  Results for orders placed or performed in visit on 09/18/15  POCT CBC  Result Value Ref Range   WBC 8.0 4.6 - 10.2 K/uL   Lymph, poc 2.6 0.6 - 3.4   POC LYMPH PERCENT 33.0 10 - 50 %L   MID (cbc) 0.7 0 - 0.9   POC MID % 8.6 0 - 12 %M   POC Granulocyte 4.7 2 - 6.9   Granulocyte percent 58.4 37 - 80 %G   RBC 4.67 (A) 4.69 - 6.13 M/uL   Hemoglobin 14.7 14.1 - 18.1 g/dL   HCT, POC 91.4 (A) 78.2 - 53.7 %   MCV 90.1 80 - 97 fL   MCH, POC 31.4 (A) 27 - 31.2 pg   MCHC 34.9 31.8 - 35.4 g/dL   RDW, POC 95.6 %   Platelet Count, POC 228 142 - 424 K/uL   MPV 7.2 0 - 99.8 fL        Assessment & Plan:  White blood cell count is back to normal. He will finish his Zithromax. He was given a prescription for Hycodan to have at night. He will have a repeat chest x-ray in about 2 weeks.I personally performed the services described in this documentation, which was scribed in my presence. The recorded information has been reviewed and is accurate.  Collene Gobble, MD

## 2015-09-18 NOTE — Patient Instructions (Addendum)
   IF you received an x-ray today, you will receive an invoice from [facility]. Please contact [facility] at [phone] with questions or concerns regarding your invoice.   IF you received labwork today, you will receive an invoice from [facility]. Please contact [facility] at [phone] with questions or concerns regarding your invoice.   Our billing staff will not be able to assist you with questions regarding bills from these companies.  You will be contacted with the lab results as soon as they are available. The fastest way to get your results is to activate your My Chart account. Instructions are located on the last page of this paperwork. If you have not heard from us regarding the results in 2 weeks, please contact this office.      Community-Acquired Pneumonia, Adult Pneumonia is an infection of the lungs. There are different types of pneumonia. One type can develop while a person is in a hospital. A different type, called community-acquired pneumonia, develops in people who are not, or have not recently been, in the hospital or other health care facility.  CAUSES Pneumonia may be caused by bacteria, viruses, or funguses. Community-acquired pneumonia is often caused by Streptococcus pneumonia bacteria. These bacteria are often passed from one person to another by breathing in droplets from the cough or sneeze of an infected person. RISK FACTORS The condition is more likely to develop in:  People who havechronic diseases, such as chronic obstructive pulmonary disease (COPD), asthma, congestive heart failure, cystic fibrosis, diabetes, or kidney disease.  People who haveearly-stage or late-stage HIV.  People who havesickle cell disease.  People who havehad their spleen removed (splenectomy).  People who havepoor dental hygiene.  People who havemedical conditions that increase the risk of breathing in (aspirating)  secretions their own mouth and nose.   People who havea weakened immune system (immunocompromised).  People who smoke.  People whotravel to areas where pneumonia-causing germs commonly exist.  People whoare around animal habitats or animals that have pneumonia-causing germs, including birds, bats, rabbits, cats, and farm animals. SYMPTOMS Symptoms of this condition include:  Adry cough.  A wet (productive) cough.  Fever.  Sweating.  Chest pain, especially when breathing deeply or coughing.  Rapid breathing or difficulty breathing.  Shortness of breath.  Shaking chills.  Fatigue.  Muscle aches. DIAGNOSIS Your health care provider will take a medical history and perform a physical exam. You may also have other tests, including:  Imaging studies of your chest, including X-rays.  Tests to check your blood oxygen level and other blood gases.  Other tests on blood, mucus (sputum), fluid around your lungs (pleural fluid), and urine. If your pneumonia is severe, other tests may be done to identify the specific cause of your illness. TREATMENT The type of treatment that you receive depends on many factors, such as the cause of your pneumonia, the medicines you take, and other medical conditions that you have. For most adults, treatment and recovery from pneumonia may occur at home. In some cases, treatment must happen in a hospital. Treatment may include:  Antibiotic medicines, if the pneumonia was caused by bacteria.  Antiviral medicines, if the pneumonia was caused by a virus.  Medicines that are given by mouth or through an IV tube.  Oxygen.  Respiratory therapy. Although rare, treating severe pneumonia may include:  Mechanical ventilation. This is done if you are not breathing well on your own and you cannot maintain a safe blood oxygen level.    Thoracentesis. This procedureremoves fluid around one lung or both lungs to help you breathe better. HOME CARE  INSTRUCTIONS  Take over-the-counter and prescription medicines only as told by your health care provider.  Only takecough medicine if you are losing sleep. Understand that cough medicine can prevent your body's natural ability to remove mucus from your lungs.  If you were prescribed an antibiotic medicine, take it as told by your health care provider. Do not stop taking the antibiotic even if you start to feel better.  Sleep in a semi-upright position at night. Try sleeping in a reclining chair, or place a few pillows under your head.  Do not use tobacco products, including cigarettes, chewing tobacco, and e-cigarettes. If you need help quitting, ask your health care provider.  Drink enough water to keep your urine clear or pale yellow. This will help to thin out mucus secretions in your lungs. PREVENTION There are ways that you can decrease your risk of developing community-acquired pneumonia. Consider getting a pneumococcal vaccine if:  You are older than 78 years of age.  You are older than 78 years of age and are undergoing cancer treatment, have chronic lung disease, or have other medical conditions that affect your immune system. Ask your health care provider if this applies to you. There are different types and schedules of pneumococcal vaccines. Ask your health care provider which vaccination option is best for you. You may also prevent community-acquired pneumonia if you take these actions:  Get an influenza vaccine every year. Ask your health care provider which type of influenza vaccine is best for you.  Go to the dentist on a regular basis.  Wash your hands often. Use hand sanitizer if soap and water are not available. SEEK MEDICAL CARE IF:  You have a fever.  You are losing sleep because you cannot control your cough with cough medicine. SEEK IMMEDIATE MEDICAL CARE IF:  You have worsening shortness of breath.  You have increased chest pain.  Your sickness becomes  worse, especially if you are an older adult or have a weakened immune system.  You cough up blood.   This information is not intended to replace advice given to you by your health care provider. Make sure you discuss any questions you have with your health care provider.   Document Released: 04/25/2005 Document Revised: 01/14/2015 Document Reviewed: 08/20/2014 Elsevier Interactive Patient Education 2016 Elsevier Inc.  

## 2015-09-23 ENCOUNTER — Other Ambulatory Visit: Payer: Self-pay | Admitting: Emergency Medicine

## 2015-10-02 ENCOUNTER — Ambulatory Visit (INDEPENDENT_AMBULATORY_CARE_PROVIDER_SITE_OTHER): Payer: Medicare Other

## 2015-10-02 ENCOUNTER — Ambulatory Visit (INDEPENDENT_AMBULATORY_CARE_PROVIDER_SITE_OTHER): Payer: Medicare Other | Admitting: Emergency Medicine

## 2015-10-02 VITALS — BP 120/78 | HR 82 | Temp 98.1°F | Resp 17 | Ht 67.0 in | Wt 192.0 lb

## 2015-10-02 DIAGNOSIS — J189 Pneumonia, unspecified organism: Secondary | ICD-10-CM

## 2015-10-02 NOTE — Progress Notes (Signed)
By signing my name below, I, Raven Small, attest that this documentation has been prepared under the direction and in the presence of Lesle Chris, MD.  Electronically Signed: Andrew Au, ED Scribe. 10/02/2015. 10:13 AM.  Chief Complaint:  Chief Complaint  Patient presents with  . Follow-up    pneumonia     HPI: Louis Bryan is a 80 y.o. male who reports to [facility] today for follow up. Pt was seen 5/12 for community acquired pneumonia and is here for follow up and repeat CXR. Pt has improved since last visit. He has a very mild productive cough occasionally but denies feeling SOB, CP, chest tightness or weak. Pt is former smoker who quit 20 years ago.   Past Medical History  Diagnosis Date  . Hypertension   . Hyperlipidemia   . Shortness of breath     On exertion  . Tobacco abuse   . History of exercise stress test [phone]    negative bruce protocol excercise stress test with scintigraphic evidence of diaphragmatic attenuation and mild apical thinning, dynamic gating was not performed secondary to frequent ectopy, low risk study  . Chronotropic incompetence - medication related 09/24/2012   Past Surgical History  Procedure Laterality Date  . Cholecystectomy N/A 10/27/2012    Procedure: LAPAROSCOPIC CHOLECYSTECTOMY WITH INTRAOPERATIVE CHOLANGIOGRAM;  Surgeon: Romie Levee, MD;  Location: [facility];  Service: General;  Laterality: N/A;   Social History   Social History  . Marital Status: Single    Spouse Name: N/A  . Number of Children: 1  . Years of Education: N/A   Occupational History  . Retired    Social History Main Topics  . Smoking status: Former Smoker    Quit date: 06/13/1992  . Smokeless tobacco: Never Used  . Alcohol Use: No  . Drug Use: No  . Sexual Activity: Yes   Other Topics Concern  . None   Social History Narrative   He walks about 30 minutes 5-6 days a week. Divorced.   Family History  Problem Relation Age of Onset  . Diabetes Sister   . Diabetes  Sister    Allergies  Allergen Reactions  . Lisinopril Cough   Prior to Admission medications   Medication Sig Start Date End Date Taking? Authorizing Provider  allopurinol (ZYLOPRIM) 100 MG tablet TAKE 1 TABLET BY MOUTH EVERY DAY 04/14/15  Yes Collene Gobble, MD  aspirin 81 MG tablet Take 81 mg by mouth daily.   Yes Historical Provider, MD  azithromycin (ZITHROMAX) 250 MG tablet Take 2 tabs PO x 1 dose, then 1 tab PO QD x 4 days 09/15/15  Yes Collene Gobble, MD  Docusate Calcium (STOOL SOFTENER PO) Take by mouth Nightly.   Yes Historical Provider, MD  HYDROcodone-homatropine (HYCODAN) 5-1.5 MG/5ML syrup Take 5 mLs by mouth every 8 (eight) hours as needed for cough. 09/18/15  Yes Collene Gobble, MD  losartan-hydrochlorothiazide (HYZAAR) 50-12.5 MG tablet TAKE 1 TABLET BY MOUTH DAILY 09/23/15  Yes Collene Gobble, MD  metoprolol succinate (TOPROL-XL) 25 MG 24 hr tablet TAKE 1 TABLET BY MOUTH EVERY DAY WITH FOOD 05/18/15  Yes Collene Gobble, MD  simvastatin (ZOCOR) 20 MG tablet TAKE 1 TABLET BY MOUTH EVERY EVENING 04/14/15  Yes Collene Gobble, MD     ROS: The patient denies fevers, chills, night sweats, unintentional weight loss, chest pain, palpitations, wheezing, dyspnea on exertion, nausea, vomiting, abdominal pain, dysuria, hematuria, melena, numbness, weakness, or tingling.  All other systems  have been reviewed and were otherwise negative with the exception of those mentioned in the HPI and as above.    PHYSICAL EXAM: Filed Vitals:   10/02/15 0832  BP: 120/78  Pulse: 82  Temp: 98.1 F (36.7 C)  Resp: 17   Body mass index is 30.06 kg/(m^2).   General: Alert, no acute distress HEENT:  Normocephalic, atraumatic, oropharynx patent. [facility] Cardiovascular:  Regular rate and rhythm, no rubs murmurs or gallops.  No Carotid bruits, radial pulse intact. No pedal edema.  Respiratory: Clear to auscultation bilaterally.  No wheezes, rales, or rhonchi.  No cyanosis, no use of accessory  musculature  Fine bibasilar rales with deep inspiration. No areas of dullness.  Abdominal: No organomegaly, abdomen is soft and non-tender, positive bowel sounds.  No masses. Musculoskeletal: Gait intact. No edema, tenderness Skin: No rashes. Neurologic: Facial musculature symmetric. Psychiatric: Patient acts appropriately throughout our interaction. Lymphatic: No cervical or submandibular lymphadenopathy   LABS:    EKG/XRAY:   [facility] Dg Chest 2 View  10/02/2015  CLINICAL DATA:  Pneumonia . EXAM: CHEST  2 VIEW COMPARISON:  09/15/2015. FINDINGS: Mediastinum and hilar structures normal. Low lung volumes with mild bibasilar atelectasis. Previously identified base infiltrate has almost completely cleared . No pleural effusion or pneumothorax. Degenerative changes thoracic spine. IMPRESSION: 1. Low lung volumes with mild bibasilar atelectasis. Previously identified base infiltrate has almost completely cleared . 2.  Mild cardiomegaly.  Normal pulmonary vascularity. Electronically Signed   By: Maisie Fushomas  Register   On: 10/02/2015 10:33   Dg Chest 2 View  09/15/2015  CLINICAL DATA:  Cough for 4 days, wheezing EXAM: CHEST  2 VIEW COMPARISON:  07/09/2014 FINDINGS: Cardiomediastinal silhouette is stable. Mild elevation of the right hemidiaphragm. There is streaky left base retrocardiac atelectasis or infiltrate. No pulmonary edema. Mild degenerative changes thoracic spine. IMPRESSION: Mild elevation of the right hemidiaphragm. Streaky left base retrocardiac atelectasis or infiltrate. No pulmonary edema. Mild degenerative changes thoracic spine. Electronically Signed   By: Natasha MeadLiviu  Pop M.D.   On: 09/15/2015 09:09     ASSESSMENT/PLAN: The infiltrate is nearly 100% gone. He was encouraged to take deep breaths during the day. He was advised to notify me if he had any worsening of symptoms.I personally performed the services described in this documentation, which was scribed  in my presence. The recorded information has been reviewed and is accurate.   Gross sideeffects, risk and benefits, and alternatives of medications d/w patient. Patient is aware that all medications have potential sideeffects and we are unable to predict every sideeffect or drug-drug interaction that may occur.  Lesle ChrisSteven Talise Sligh MD 10/02/2015 10:12 AM

## 2015-10-02 NOTE — Patient Instructions (Signed)
     IF you received an x-ray today, you will receive an invoice from [facility]. Please contact [facility] at [phone] with questions or concerns regarding your invoice.   IF you received labwork today, you will receive an invoice from [facility]. Please contact [facility] at [phone] with questions or concerns regarding your invoice.   Our billing staff will not be able to assist you with questions regarding bills from these companies.  You will be contacted with the lab results as soon as they are available. The fastest way to get your results is to activate your My Chart account. Instructions are located on the last page of this paperwork. If you have not heard from us regarding the results in 2 weeks, please contact this office.      

## 2015-10-12 ENCOUNTER — Other Ambulatory Visit: Payer: Self-pay | Admitting: Emergency Medicine

## 2015-11-16 ENCOUNTER — Other Ambulatory Visit: Payer: Self-pay | Admitting: Emergency Medicine

## 2015-12-21 ENCOUNTER — Ambulatory Visit (INDEPENDENT_AMBULATORY_CARE_PROVIDER_SITE_OTHER): Payer: Medicare Other | Admitting: Physician Assistant

## 2015-12-21 ENCOUNTER — Encounter: Payer: Self-pay | Admitting: Physician Assistant

## 2015-12-21 VITALS — BP 132/84 | HR 72 | Temp 98.3°F | Resp 18 | Ht 67.0 in | Wt 196.0 lb

## 2015-12-21 DIAGNOSIS — I10 Essential (primary) hypertension: Secondary | ICD-10-CM

## 2015-12-21 DIAGNOSIS — R972 Elevated prostate specific antigen [PSA]: Secondary | ICD-10-CM | POA: Diagnosis not present

## 2015-12-21 DIAGNOSIS — E785 Hyperlipidemia, unspecified: Secondary | ICD-10-CM | POA: Diagnosis not present

## 2015-12-21 DIAGNOSIS — R739 Hyperglycemia, unspecified: Secondary | ICD-10-CM

## 2015-12-21 LAB — CBC WITH DIFFERENTIAL/PLATELET
Basophils Absolute: 81 cells/uL (ref 0–200)
Basophils Relative: 1 %
EOS ABS: 162 {cells}/uL (ref 15–500)
EOS PCT: 2 %
HCT: 45.8 % (ref 38.5–50.0)
HEMOGLOBIN: 15.3 g/dL (ref 13.2–17.1)
Lymphs Abs: 3159 cells/uL (ref 850–3900)
MCH: 30.8 pg (ref 27.0–33.0)
MCHC: 33.4 g/dL (ref 32.0–36.0)
MCV: 92.2 fL (ref 80.0–100.0)
MONOS PCT: 9 %
MPV: 9.9 fL (ref 7.5–12.5)
Monocytes Absolute: 729 cells/uL (ref 200–950)
NEUTROS ABS: 3969 {cells}/uL (ref 1500–7800)
Neutrophils Relative %: 49 %
PLATELETS: 212 10*3/uL (ref 140–400)
RBC: 4.97 MIL/uL (ref 4.20–5.80)
RDW: 14.4 % (ref 11.0–15.0)
WBC: 8.1 10*3/uL (ref 3.8–10.8)

## 2015-12-21 LAB — COMPREHENSIVE METABOLIC PANEL
ALBUMIN: 4 g/dL (ref 3.6–5.1)
ALT: 14 U/L (ref 9–46)
AST: 13 U/L (ref 10–35)
Alkaline Phosphatase: 98 U/L (ref 40–115)
BUN: 11 mg/dL (ref 7–25)
CALCIUM: 9.5 mg/dL (ref 8.6–10.3)
CHLORIDE: 104 mmol/L (ref 98–110)
CO2: 29 mmol/L (ref 20–31)
Creat: 1.12 mg/dL (ref 0.70–1.18)
Glucose, Bld: 107 mg/dL — ABNORMAL HIGH (ref 65–99)
Potassium: 4.2 mmol/L (ref 3.5–5.3)
Sodium: 139 mmol/L (ref 135–146)
TOTAL PROTEIN: 7.1 g/dL (ref 6.1–8.1)
Total Bilirubin: 0.8 mg/dL (ref 0.2–1.2)

## 2015-12-21 LAB — PSA: PSA: 1 ng/mL (ref <=4.0)

## 2015-12-21 NOTE — Patient Instructions (Signed)
     IF you received an x-ray today, you will receive an invoice from [facility]. Please contact [facility] at [phone] with questions or concerns regarding your invoice.   IF you received labwork today, you will receive an invoice from [facility]. Please contact [facility] at [phone] with questions or concerns regarding your invoice.   Our billing staff will not be able to assist you with questions regarding bills from these companies.  You will be contacted with the lab results as soon as they are available. The fastest way to get your results is to activate your My Chart account. Instructions are located on the last page of this paperwork. If you have not heard from us regarding the results in 2 weeks, please contact this office.      

## 2015-12-21 NOTE — Progress Notes (Signed)
Patient ID: Louis Bryan, male    DOB: 03/20/1936, 80 y.o.   MRN: [id number]  PCP: Lucilla EdinAUB, STEVE A, MD  Subjective:   Chief Complaint  Patient presents with  . Follow-up    6 MONTH FOLLOW UP FROM PHYSICAL    HPI Presents for evaluation of HTN, hyperlipidemia and rising PSA noted at his CPE in 07/2015 .  Not taking Losartan-HCTZ. He's not sure when or why it was stopped. I find no notes to explain that in the record. He has a vague memory of being told it might have been "too strong" for him. I see that it was on his medication list as recently as 09/2015, and at his most recent cardiology visit in 08/2015 it was noted that he was stable on ARB-HCTZ product.  No CP, SOB, LE edema, myalgias, HA, dizziness, urinary urgency, hematuria, nausea, vomiting, diarrhea, constipation.  Occasional dark stools, "every once in a while." Seems to be dependent on what he eats. Last colonoscopy 2015 with Dr. Elnoria HowardHung. He's to repeat in 3-5 years.   Review of Systems As above.    Patient Active Problem List   Diagnosis Date Noted  . Blood in stool 11/26/2013  . Obesity (BMI 30-39.9) 09/29/2013  . Chronotropic incompetence - medication related 09/24/2012  . Essential hypertension 09/20/2011  . Hyperlipidemia 09/20/2011     Prior to Admission medications   Medication Sig Start Date End Date Taking? Authorizing Provider  allopurinol (ZYLOPRIM) 100 MG tablet TAKE 1 TABLET BY MOUTH EVERY DAY 10/14/15  Yes Collene GobbleSteven A Daub, MD  aspirin 81 MG tablet Take 81 mg by mouth daily.   Yes Historical Provider, MD  Docusate Calcium (STOOL SOFTENER PO) Take by mouth Nightly.   Yes Historical Provider, MD  HYDROcodone-homatropine (HYCODAN) 5-1.5 MG/5ML syrup Take 5 mLs by mouth every 8 (eight) hours as needed for cough. 09/18/15  Yes Collene GobbleSteven A Daub, MD  metoprolol succinate (TOPROL-XL) 25 MG 24 hr tablet TAKE 1 TABLET BY MOUTH EVERY DAY WITH FOOD 11/18/15  Yes Collene GobbleSteven A Daub, MD  simvastatin (ZOCOR) 20 MG tablet TAKE 1 TABLET  BY MOUTH EVERY EVENING 10/14/15  Yes Collene GobbleSteven A Daub, MD  losartan-hydrochlorothiazide (HYZAAR) 50-12.5 MG tablet TAKE 1 TABLET BY MOUTH DAILY Patient not taking: Reported on 12/21/2015 09/23/15   Collene GobbleSteven A Daub, MD     Allergies  Allergen Reactions  . Lisinopril Cough       Objective:  Physical Exam  Constitutional: He is oriented to person, place, and time. He appears well-developed and well-nourished. He is active and cooperative. No distress.  BP 132/84 (BP Location: Right Arm, Patient Position: Sitting, Cuff Size: Small)   Pulse 72   Temp 98.3 F (36.8 C) (Oral)   Resp 18   Ht 5\' 7"  (1.702 m)   Wt 196 lb (88.9 kg)   SpO2 97%   BMI 30.70 kg/m   HENT:  Head: Normocephalic and atraumatic.  Right Ear: Hearing normal.  Left Ear: Hearing normal.  Eyes: Conjunctivae are normal. No scleral icterus.  Neck: Normal range of motion. Neck supple. No thyromegaly present.  Cardiovascular: Normal rate, regular rhythm and normal heart sounds.   Pulses:      Radial pulses are 2+ on the right side, and 2+ on the left side.  No LE edema  Pulmonary/Chest: Effort normal and breath sounds normal.  Lymphadenopathy:       Head (right side): No tonsillar, no preauricular, no posterior auricular and no occipital adenopathy present.  Head (left side): No tonsillar, no preauricular, no posterior auricular and no occipital adenopathy present.    He has no cervical adenopathy.       Right: No supraclavicular adenopathy present.       Left: No supraclavicular adenopathy present.  Neurological: He is alert and oriented to person, place, and time. No sensory deficit.  Skin: Skin is warm, dry and intact. No rash noted. No cyanosis or erythema. Nails show no clubbing.  Psychiatric: He has a normal mood and affect. His speech is normal and behavior is normal.           Assessment & Plan:   1. Essential hypertension Controlled. Only on BB. Not clear when/why losartan-HCTZ was stopped. BP is  controlled today. Will ask cardiology if they prefer to restart it, perhaps ARB alone. - CBC with Differential/Platelet - Comprehensive metabolic panel  2. Hyperlipidemia Was controlled in 07/2015. Repeat in 6 months.  3. Rising PSA level Await lab results. Not symptomatic at present. - PSA  4. Hyperglycemia A1C was 6.4% in 07/2015. Await lab results. - Hemoglobin A1c   Return in about 6 months (around 06/22/2016) for follow-up and Annual Wellness Visit.    Fernande Brashelle S. Mattelyn Imhoff, PA-C Physician Assistant-Certified [facility]

## 2015-12-22 ENCOUNTER — Encounter: Payer: Self-pay | Admitting: Physician Assistant

## 2015-12-22 LAB — HEMOGLOBIN A1C
HEMOGLOBIN A1C: 6.1 % — AB (ref <5.7)
MEAN PLASMA GLUCOSE: 128 mg/dL

## 2015-12-22 NOTE — Progress Notes (Signed)
Patient notified by letter

## 2016-01-08 ENCOUNTER — Other Ambulatory Visit: Payer: Self-pay | Admitting: Physician Assistant

## 2016-02-15 ENCOUNTER — Other Ambulatory Visit: Payer: Self-pay | Admitting: Emergency Medicine

## 2016-02-16 NOTE — Telephone Encounter (Signed)
12/2015 last ov and labs  

## 2016-03-21 ENCOUNTER — Other Ambulatory Visit: Payer: Self-pay | Admitting: Emergency Medicine

## 2016-03-21 NOTE — Telephone Encounter (Signed)
12/2015 last ov I dont see losartan on current med list

## 2016-03-21 NOTE — Telephone Encounter (Signed)
Meds ordered this encounter  Medications  . losartan-hydrochlorothiazide (HYZAAR) 50-12.5 MG tablet    Sig: TAKE 1 TABLET BY MOUTH DAILY    Dispense:  90 tablet    Refill:  0    He probably means this medication, lots of patients refer to these combinations by just the first component, and leave off the HCTZ.

## 2016-04-11 ENCOUNTER — Other Ambulatory Visit: Payer: Self-pay | Admitting: Emergency Medicine

## 2016-04-13 ENCOUNTER — Telehealth: Payer: Self-pay

## 2016-04-13 NOTE — Telephone Encounter (Signed)
Pharmacy is calling stating that patient is out of medication that was requested for refill on 04/11/16

## 2016-04-14 NOTE — Telephone Encounter (Signed)
12/2015 last ov  12/2015 last lab

## 2016-04-14 NOTE — Telephone Encounter (Signed)
See 04/11/16 request

## 2016-05-16 ENCOUNTER — Ambulatory Visit (INDEPENDENT_AMBULATORY_CARE_PROVIDER_SITE_OTHER): Payer: Medicare Other | Admitting: Physician Assistant

## 2016-05-16 VITALS — BP 126/80 | HR 84 | Temp 98.0°F | Resp 18 | Ht 67.0 in | Wt 193.0 lb

## 2016-05-16 DIAGNOSIS — Z23 Encounter for immunization: Secondary | ICD-10-CM | POA: Diagnosis not present

## 2016-05-16 DIAGNOSIS — E785 Hyperlipidemia, unspecified: Secondary | ICD-10-CM | POA: Diagnosis not present

## 2016-05-16 DIAGNOSIS — R739 Hyperglycemia, unspecified: Secondary | ICD-10-CM | POA: Diagnosis not present

## 2016-05-16 DIAGNOSIS — I10 Essential (primary) hypertension: Secondary | ICD-10-CM | POA: Diagnosis not present

## 2016-05-16 MED ORDER — ZOSTER VAC RECOMB ADJUVANTED 50 MCG/0.5ML IM SUSR: 50.0000 ug | Freq: Once | INTRAMUSCULAR | 0 refills | Status: DC

## 2016-05-16 NOTE — Addendum Note (Signed)
Addended by: Baldwin CrownJOHNSON, SHAQUETTA D on: 05/16/2016 06:16 PM   Modules accepted: Orders

## 2016-05-16 NOTE — Progress Notes (Signed)
Patient ID: Louis Bryan, male     DOB: 04/05/1936, 81 y.o.    MRN: [id number]  PCP: Porfirio Oarhelle Nautia Lem, PA-C  Chief Complaint  Patient presents with  . Follow-up    Transfer care from daub    Subjective:   This patient is new to me and presents to establish care, as Dr. Cleta Albertsaub has retired.  I actually saw him in August and we changed his PCP in the record at that time in anticipation of Dr. Ellis Parentsaub's retirement. At that visit, we planned re-evaluation in 6 months, but he presents today. He does not need refills of any medications. He feels well and denies problems, concerns, side effects of his medications. He celebrated his birthday last week.  Review of Systems  Constitutional: Negative for activity change, appetite change, fatigue and unexpected weight change.  HENT: Negative for congestion, dental problem, ear pain, hearing loss, mouth sores, postnasal drip, rhinorrhea, sneezing, sore throat, tinnitus and trouble swallowing.   Eyes: Negative for photophobia, pain, redness and visual disturbance.  Respiratory: Negative for cough, chest tightness and shortness of breath.   Cardiovascular: Negative for chest pain, palpitations and leg swelling.  Gastrointestinal: Negative for abdominal pain, blood in stool, constipation, diarrhea, nausea and vomiting.  Endocrine: Negative.   Genitourinary: Negative for dysuria, frequency, hematuria and urgency.  Musculoskeletal: Negative for arthralgias, gait problem, myalgias and neck stiffness.  Skin: Negative for rash.  Neurological: Negative for dizziness, speech difficulty, weakness, light-headedness, numbness and headaches.  Hematological: Negative for adenopathy.  Psychiatric/Behavioral: Negative for confusion and sleep disturbance. The patient is not nervous/anxious.      Prior to Admission medications   Medication Sig Start Date End Date Taking? Authorizing Provider  allopurinol (ZYLOPRIM) 100 MG tablet TAKE 1 TABLET BY MOUTH EVERY DAY  04/14/16  Yes Seleny Allbright, PA-C  aspirin 81 MG tablet Take 81 mg by mouth daily.   Yes Historical Provider, MD  Docusate Calcium (STOOL SOFTENER PO) Take by mouth Nightly.   Yes Historical Provider, MD  losartan-hydrochlorothiazide (HYZAAR) 50-12.5 MG tablet TAKE 1 TABLET BY MOUTH DAILY 03/21/16  Yes Kenadie Royce, PA-C  metoprolol succinate (TOPROL-XL) 25 MG 24 hr tablet TAKE 1 TABLET BY MOUTH EVERY DAY WITH FOOD 02/16/16  Yes Sheamus Hasting, PA-C  simvastatin (ZOCOR) 20 MG tablet TAKE 1 TABLET BY MOUTH EVERY EVENING 04/14/16  Yes Ishitha Roper, PA-C     Allergies  Allergen Reactions  . Lisinopril Cough     Patient Active Problem List   Diagnosis Date Noted  . Rising PSA level 12/21/2015  . Blood in stool 11/26/2013  . Obesity (BMI 30-39.9) 09/29/2013  . Chronotropic incompetence - medication related 09/24/2012  . Essential hypertension 09/20/2011  . Hyperlipidemia 09/20/2011     Family History  Problem Relation Age of Onset  . Diabetes Sister   . Diabetes Sister      Social History   Social History  . Marital status: Single    Spouse name: divorced  . Number of children: 1  . Years of education: N/A   Occupational History  . Retired     [employer]   Social History Main Topics  . Smoking status: Former Smoker    Quit date: 06/13/1992  . Smokeless tobacco: Never Used  . Alcohol use No  . Drug use: No  . Sexual activity: Yes   Other Topics Concern  . Not on file   Social History Narrative   He walks about 30 minutes 5-6 days a  week.    His daughter lives with him.   One sister lives locally.         Objective:  Physical Exam  Constitutional: He is oriented to person, place, and time. He appears well-developed and well-nourished. He is active and cooperative. No distress.  BP 126/80 (BP Location: Right Arm, Patient Position: Sitting, Cuff Size: Small)   Pulse 84   Temp 98 F (36.7 C) (Oral)   Resp 18   Ht 5\' 7"  (1.702 m)   Wt 193 lb (87.5 kg)    SpO2 96%   BMI 30.23 kg/m   HENT:  Head: Normocephalic and atraumatic.  Right Ear: Hearing normal.  Left Ear: Hearing normal.  Eyes: Conjunctivae are normal. No scleral icterus.  Neck: Normal range of motion. Neck supple. No thyromegaly present.  Cardiovascular: Normal rate, regular rhythm and normal heart sounds.   Pulses:      Radial pulses are 2+ on the right side, and 2+ on the left side.  Pulmonary/Chest: Effort normal and breath sounds normal.  Lymphadenopathy:       Head (right side): No tonsillar, no preauricular, no posterior auricular and no occipital adenopathy present.       Head (left side): No tonsillar, no preauricular, no posterior auricular and no occipital adenopathy present.    He has no cervical adenopathy.       Right: No supraclavicular adenopathy present.       Left: No supraclavicular adenopathy present.  Neurological: He is alert and oriented to person, place, and time. No sensory deficit.  Skin: Skin is warm, dry and intact. No rash noted. No cyanosis or erythema. Nails show no clubbing.  Psychiatric: He has a normal mood and affect. His speech is normal and behavior is normal.             Assessment & Plan:  1. Essential hypertension COntrolled.  - Comprehensive metabolic panel - Care order/instruction:  2. Hyperlipidemia, unspecified hyperlipidemia type Await labs. Adjust regimen as indicated by results. He is not fasting. Will interpret results as such. - Comprehensive metabolic panel - Lipid panel  3. Hyperglycemia A1C 6.1% in 12/2015. - Comprehensive metabolic panel - Hemoglobin A1c  4. Need for shingles vaccine The last time he tried to get the vaccine, it wasn't covered by his health plan. Will try again. - Zoster Vac Recomb Adjuvanted (SHINGRIX) 50 MCG SUSR; Inject 50 mcg into the muscle once.  Dispense: 1 each; Refill: 0   Return in about 6 months (around 11/13/2016) for re-evaluation.    Fernande Bras, PA-C Physician  Assistant-Certified [facility]

## 2016-05-16 NOTE — Patient Instructions (Signed)
     IF you received an x-ray today, you will receive an invoice from [facility]. Please contact [facility] at [phone] with questions or concerns regarding your invoice.   IF you received labwork today, you will receive an invoice from [facility]. Please contact [facility] at [phone] with questions or concerns regarding your invoice.   Our billing staff will not be able to assist you with questions regarding bills from these companies.  You will be contacted with the lab results as soon as they are available. The fastest way to get your results is to activate your My Chart account. Instructions are located on the last page of this paperwork. If you have not heard from us regarding the results in 2 weeks, please contact this office.     

## 2016-05-17 LAB — COMPREHENSIVE METABOLIC PANEL
A/G RATIO: 1.3 (ref 1.2–2.2)
ALK PHOS: 105 IU/L (ref 39–117)
ALT: 14 IU/L (ref 0–44)
AST: 12 IU/L (ref 0–40)
Albumin: 4.1 g/dL (ref 3.5–4.8)
BILIRUBIN TOTAL: 0.8 mg/dL (ref 0.0–1.2)
BUN/Creatinine Ratio: 13 (ref 10–24)
BUN: 14 mg/dL (ref 8–27)
CALCIUM: 9.4 mg/dL (ref 8.6–10.2)
CHLORIDE: 104 mmol/L (ref 96–106)
CO2: 22 mmol/L (ref 18–29)
Creatinine, Ser: 1.12 mg/dL (ref 0.76–1.27)
GFR calc Af Amer: 72 mL/min/{1.73_m2} (ref >59)
GFR, EST NON AFRICAN AMERICAN: 62 mL/min/{1.73_m2} (ref >59)
GLOBULIN, TOTAL: 3.2 g/dL (ref 1.5–4.5)
Glucose: 79 mg/dL (ref 65–99)
POTASSIUM: 4.6 mmol/L (ref 3.5–5.2)
Sodium: 144 mmol/L (ref 134–144)
Total Protein: 7.3 g/dL (ref 6.0–8.5)

## 2016-05-17 LAB — HEMOGLOBIN A1C
ESTIMATED AVERAGE GLUCOSE: 126 mg/dL
Hgb A1c MFr Bld: 6 % — ABNORMAL HIGH (ref 4.8–5.6)

## 2016-05-17 LAB — MICROALBUMIN / CREATININE URINE RATIO
Creatinine, Urine: 139 mg/dL
MICROALBUM., U, RANDOM: 13 ug/mL
Microalb/Creat Ratio: 9.4 mg/g creat (ref 0.0–30.0)

## 2016-05-17 LAB — LIPID PANEL
CHOLESTEROL TOTAL: 169 mg/dL (ref 100–199)
Chol/HDL Ratio: 3.4 ratio units (ref 0.0–5.0)
HDL: 50 mg/dL (ref >39)
LDL CALC: 73 mg/dL (ref 0–99)
TRIGLYCERIDES: 232 mg/dL — AB (ref 0–149)
VLDL CHOLESTEROL CAL: 46 mg/dL — AB (ref 5–40)

## 2016-05-17 LAB — MICROALBUMIN, URINE

## 2016-05-18 ENCOUNTER — Encounter: Payer: Self-pay | Admitting: Physician Assistant

## 2016-06-20 ENCOUNTER — Other Ambulatory Visit: Payer: Self-pay | Admitting: Physician Assistant

## 2016-06-28 ENCOUNTER — Ambulatory Visit (INDEPENDENT_AMBULATORY_CARE_PROVIDER_SITE_OTHER): Payer: Medicare Other | Admitting: Family Medicine

## 2016-06-28 VITALS — BP 116/70 | HR 79 | Temp 98.3°F | Resp 18 | Ht 67.0 in | Wt 198.0 lb

## 2016-06-28 DIAGNOSIS — H1032 Unspecified acute conjunctivitis, left eye: Secondary | ICD-10-CM

## 2016-06-28 DIAGNOSIS — H5462 Unqualified visual loss, left eye, normal vision right eye: Secondary | ICD-10-CM

## 2016-06-28 MED ORDER — GENTAMICIN SULFATE 0.3 % OP SOLN: 1.0000 [drp] | OPHTHALMIC | 0 refills | Status: DC

## 2016-06-28 MED ORDER — IPRATROPIUM BROMIDE 0.03 % NA SOLN: 2.0000 | Freq: Four times a day (QID) | NASAL | 1 refills | Status: DC

## 2016-06-28 NOTE — Patient Instructions (Addendum)
Use the eye drops and nasal spray 4 times a day. If your eye pain, vision change, light sensitivity, drainage worsens at all, please come back into clinic immediately. SEE DR Dione Booze 06/29/16 AT 345PM  [address] [place]  IF you received an x-ray today, you will receive an invoice from [facility]. Please contact [facility] at [phone] with questions or concerns regarding your invoice.   IF you received labwork today, you will receive an invoice from [facility]. Please contact [facility] at [phone] with questions or concerns regarding your invoice.   Our billing staff will not be able to assist you with questions regarding bills from these companies.  You will be contacted with the lab results as soon as they are available. The fastest way to get your results is to activate your My Chart account. Instructions are located on the last page of this paperwork. If you have not heard from [place] regarding the results in 2 weeks, please contact this office.     Viral Conjunctivitis, Adult Viral conjunctivitis is an inflammation of the clear membrane that covers the white part of your eye and the inner surface of your eyelid (conjunctiva). The inflammation is caused by a viral infection. The blood vessels in the conjunctiva become inflamed, causing the eye to become red or pink, and often itchy. Viral conjunctivitis can be easily passed from one person to another (is contagious). This condition is often called pink eye. What are the causes? This condition is caused by a virus. A virus is a type of contagious germ. It can be spread by touching objects that have been contaminated with the virus, such as doorknobs or towels. It can also be passed through droplets, such as from coughing or sneezing. What are the signs or symptoms? Symptoms of this condition include:  Eye redness.  Tearing or watery eyes.  Itchy and irritated eyes.  Burning feeling in the eyes.  Clear  drainage from the eye.  Swollen eyelids.  A gritty feeling in the eye.  Light sensitivity. This condition often occurs with other symptoms, such as a fever, nausea, or a rash. How is this diagnosed? This condition is diagnosed with a medical history and physical exam. If you have discharge from your eye, the discharge may be tested to rule out other causes of conjunctivitis. How is this treated? Viral conjunctivitis does not respond to medicines that kill bacteria (antibiotics). Treatment for viral conjunctivitis is directed at stopping a bacterial infection from developing in addition to the viral infection. Treatment also aims to relieve your symptoms, such as itching. This may be done with antihistamine drops or other eye medicines. Rarely, steroid eye drops or antiviral medicines may be prescribed. Follow these instructions at home: Medicines  Take or apply over-the-counter and prescription medicines only as told by your health care provider.  Be very careful to avoid touching the edge of the eyelid with the eye drop bottle or ointment tube when applying medicines to the affected eye. Being careful this way will stop you from spreading the infection to the other eye or to other people. Eye care  Avoid touching or rubbing your eyes.  Apply a warm, wet, clean washcloth to your eye for 10-20 minutes, 3-4 times per day or as told by your health care provider.  If you wear contact lenses, do not wear them until the inflammation is gone and your health care provider says it is safe to wear them again. Ask your health care  provider how to sterilize or replace your contact lenses before using them again. Wear glasses until you can resume wearing contacts.  Avoid wearing eye makeup until the inflammation is gone. Throw away any old eye cosmetics that may be contaminated.  Gently wipe away any drainage from your eye with a warm, wet washcloth or a cotton ball. General instructions  Change or  wash your pillowcase every day or as told by your health care provider.  Do not share towels, pillowcases, washcloths, eye makeup, makeup brushes, contact lenses, or glasses. This may spread the infection.  Wash your hands often with soap and water. Use paper towels to dry your hands. If soap and water are not available, use hand sanitizer.  Try to avoid contact with other people for one week or as told by your health care provider. Contact a health care provider if:  Your symptoms do not improve with treatment or they get worse.  You have increased pain.  Your vision becomes blurry.  You have a fever.  You have facial pain, redness, or swelling.  You have yellow or green drainage coming from your eye.  You have new symptoms. This information is not intended to replace advice given to you by your health care provider. Make sure you discuss any questions you have with your health care provider. Document Released: 07/16/2002 Document Revised: 11/21/2015 Document Reviewed: 11/10/2015 Elsevier Interactive Patient Education  2017 [employer]  Upper Respiratory Infection, Adult Most upper respiratory infections (URIs) are a viral infection of the air passages leading to the lungs. A URI affects the nose, throat, and upper air passages. The most common type of URI is nasopharyngitis and is typically referred to as "the common cold." URIs run their course and usually go away on their own. Most of the time, a URI does not require medical attention, but sometimes a bacterial infection in the upper airways can follow a viral infection. This is called a secondary infection. Sinus and middle ear infections are common types of secondary upper respiratory infections. Bacterial pneumonia can also complicate a URI. A URI can worsen asthma and chronic obstructive pulmonary disease (COPD). Sometimes, these complications can require emergency medical care and may be life threatening. What are the  causes? Almost all URIs are caused by viruses. A virus is a type of germ and can spread from one person to another. What increases the risk? You may be at risk for a URI if:  You smoke.  You have chronic heart or lung disease.  You have a weakened defense (immune) system.  You are very young or very old.  You have nasal allergies or asthma.  You work in crowded or poorly ventilated areas.  You work in health care facilities or schools. What are the signs or symptoms? Symptoms typically develop 2-3 days after you come in contact with a cold virus. Most viral URIs last 7-10 days. However, viral URIs from the influenza virus (flu virus) can last 14-18 days and are typically more severe. Symptoms may include:  Runny or stuffy (congested) nose.  Sneezing.  Cough.  Sore throat.  Headache.  Fatigue.  Fever.  Loss of appetite.  Pain in your forehead, behind your eyes, and over your cheekbones (sinus pain).  Muscle aches. How is this diagnosed? Your health care provider may diagnose a URI by:  Physical exam.  Tests to check that your symptoms are not due to another condition such as:  Strep throat.  Sinusitis.  Pneumonia.  Asthma.  How is this treated? A URI goes away on its own with time. It cannot be cured with medicines, but medicines may be prescribed or recommended to relieve symptoms. Medicines may help:  Reduce your fever.  Reduce your cough.  Relieve nasal congestion. Follow these instructions at home:  Take medicines only as directed by your health care provider.  Gargle warm saltwater or take cough drops to comfort your throat as directed by your health care provider.  Use a warm mist humidifier or inhale steam from a shower to increase air moisture. This may make it easier to breathe.  Drink enough fluid to keep your urine clear or pale yellow.  Eat soups and other clear broths and maintain good nutrition.  Rest as needed.  Return to work  when your temperature has returned to normal or as your health care provider advises. You may need to stay home longer to avoid infecting others. You can also use a face mask and careful hand washing to prevent spread of the virus.  Increase the usage of your inhaler if you have asthma.  Do not use any tobacco products, including cigarettes, chewing tobacco, or electronic cigarettes. If you need help quitting, ask your health care provider. How is this prevented? The best way to protect yourself from getting a cold is to practice good hygiene.  Avoid oral or hand contact with people with cold symptoms.  Wash your hands often if contact occurs. There is no clear evidence that vitamin C, vitamin E, echinacea, or exercise reduces the chance of developing a cold. However, it is always recommended to get plenty of rest, exercise, and practice good nutrition. Contact a health care provider if:  You are getting worse rather than better.  Your symptoms are not controlled by medicine.  You have chills.  You have worsening shortness of breath.  You have brown or red mucus.  You have yellow or brown nasal discharge.  You have pain in your face, especially when you bend forward.  You have a fever.  You have swollen neck glands.  You have pain while swallowing.  You have white areas in the back of your throat. Get help right away if:  You have severe or persistent:  Headache.  Ear pain.  Sinus pain.  Chest pain.  You have chronic lung disease and any of the following:  Wheezing.  Prolonged cough.  Coughing up blood.  A change in your usual mucus.  You have a stiff neck.  You have changes in your:  Vision.  Hearing.  Thinking.  Mood. This information is not intended to replace advice given to you by your health care provider. Make sure you discuss any questions you have with your health care provider. Document Released: 10/19/2000 Document Revised: 12/27/2015  Document Reviewed: 07/31/2013 Elsevier Interactive Patient Education  2017 [employer].

## 2016-06-28 NOTE — Progress Notes (Addendum)
Subjective:  This chart was scribed for Norberto SorensonEva Sevrin Sally MD, by Veverly FellsHatice Demirci,scribe, at [facility].  This patient was seen in room  2  and the patient's care was started at 1:03 PM.   Chief Complaint  Patient presents with  . Sinusitis     Patient ID: Louis Bryan, male    DOB: 03/19/1936, 81 y.o.   MRN: [id number]  HPI HPI Comments: Louis LiMoses Tusing is a 81 y.o. male who presents to the [facility] complaining of sinus pressure/pain onset three days ago. He has associated symptoms of rhinorrhea and  pain with draining/redness/ithcing from his left eye.  Patient denies any recent injury to his left eye.   He woke up yesterday and this morning with some matter from his eye.  He denies any vision changes or photophobia.   Denies any fever/chills, coughing. Patient does not have an eye doctor. He has not used any nasal spray of eye drops to relieve his symptoms.     Past Medical History:  Diagnosis Date  . Chronotropic incompetence - medication related 09/24/2012  . History of exercise stress test [phone]   negative bruce protocol excercise stress test with scintigraphic evidence of diaphragmatic attenuation and mild apical thinning, dynamic gating was not performed secondary to frequent ectopy, low risk study  . Hyperlipidemia   . Hypertension   . Shortness of breath    On exertion  . Tobacco abuse     Current Outpatient Prescriptions on File Prior to Visit  Medication Sig Dispense Refill  . allopurinol (ZYLOPRIM) 100 MG tablet TAKE 1 TABLET BY MOUTH EVERY DAY 90 tablet 0  . aspirin 81 MG tablet Take 81 mg by mouth daily.    Tery Sanfilippo. Docusate Calcium (STOOL SOFTENER PO) Take by mouth Nightly.    Marland Kitchen. losartan-hydrochlorothiazide (HYZAAR) 50-12.5 MG tablet TAKE 1 TABLET BY MOUTH DAILY 90 tablet 0  . metoprolol succinate (TOPROL-XL) 25 MG 24 hr tablet TAKE 1 TABLET BY MOUTH EVERY DAY WITH FOOD 90 tablet 1  . simvastatin (ZOCOR) 20 MG tablet TAKE 1 TABLET BY MOUTH EVERY  EVENING 90 tablet 0   No current facility-administered medications on file prior to visit.     Allergies  Allergen Reactions  . Lisinopril Cough   [facility] 06/28/2016 05/16/2016 10/02/2015 09/18/2015 09/15/2015  Decreased Interest 0 0 0 0 0  Down, Depressed, Hopeless 0 0 0 0 0  PHQ - 2 Score 0 0 0 0 0    Review of Systems  Constitutional: Negative for chills and fever.  HENT: Positive for rhinorrhea, sinus pain and sinus pressure. Negative for postnasal drip and sore throat.   Eyes: Positive for pain, discharge, redness and itching. Negative for photophobia and visual disturbance.  Gastrointestinal: Negative for nausea and vomiting.  Musculoskeletal: Negative for neck pain and neck stiffness.  Neurological: Negative for syncope and speech difficulty.       Objective:   Physical Exam  Constitutional: He appears well-developed and well-nourished. No distress.  HENT:  TMs with mid ear effusion, nares with rhinitis.   oropharynx is normal  Eyes:  Underneath left lower eyelid with some erythema and slight amount of edema. Lower lid is swollen.  Some slight chemosis of the lower left conjunctiva and injection in the medial aspect.   Neck:  enlarged tonsillar nodes and anterior cervical nodes.  Cardiovascular: Normal rate, regular rhythm, S1 normal, S2 normal and normal heart sounds.   Pulmonary/Chest: Effort normal and  breath sounds normal. No respiratory distress. He has no wheezes. He has no rales.  Good air movement.   Skin: Skin is warm and dry.  Psychiatric: He has a normal mood and affect. His behavior is normal.    Vitals:   06/28/16 1141  BP: 116/70  Pulse: 79  Resp: 18  Temp: 98.3 F (36.8 C)  TempSrc: Oral  SpO2: 96%  Weight: 198 lb (89.8 kg)  Height: 5\' 7"  (1.702 m)    Visual Acuity Screening   Right eye Left eye Both eyes  Without correction: 20/25 20/200 20/30  With correction:       Visual Acuity Screening   Right eye Left eye Both eyes    Without correction: 20/25 20/200 20/30  With correction:          Assessment & Plan:   1. Acute conjunctivitis of left eye, unspecified acute conjunctivitis type   2. Vision loss of left eye - stat referral made to optho for further eval - visit this afternoon    Orders Placed This Encounter  Procedures  . Ambulatory referral to Ophthalmology    Referral Priority:   Urgent    Referral Type:   Consultation    Referral Reason:   Specialty Services Required    Requested Specialty:   Ophthalmology    Number of Visits Requested:   1    Meds ordered this encounter  Medications  . gentamicin (GARAMYCIN) 0.3 % ophthalmic solution    Sig: Place 1-2 drops into both eyes every 4 (four) hours.    Dispense:  5 mL    Refill:  0  . ipratropium (ATROVENT) 0.03 % nasal spray    Sig: Place 2 sprays into the nose 4 (four) times daily.    Dispense:  30 mL    Refill:  1    I personally performed the services described in this documentation, which was scribed in my presence. The recorded information has been reviewed and considered, and addended by me as needed.   Norberto Sorenson, M.D.  Primary Care at [facility] [address] [place], [place] [address] [phone] phone [phone] fax  07/26/16 11:17 PM

## 2016-06-29 DIAGNOSIS — H5713 Ocular pain, bilateral: Secondary | ICD-10-CM | POA: Diagnosis not present

## 2016-06-29 DIAGNOSIS — H35352 Cystoid macular degeneration, left eye: Secondary | ICD-10-CM | POA: Diagnosis not present

## 2016-06-29 DIAGNOSIS — H43822 Vitreomacular adhesion, left eye: Secondary | ICD-10-CM | POA: Diagnosis not present

## 2016-06-29 DIAGNOSIS — H40033 Anatomical narrow angle, bilateral: Secondary | ICD-10-CM | POA: Diagnosis not present

## 2016-06-29 DIAGNOSIS — H25813 Combined forms of age-related cataract, bilateral: Secondary | ICD-10-CM | POA: Diagnosis not present

## 2016-06-30 DIAGNOSIS — H40033 Anatomical narrow angle, bilateral: Secondary | ICD-10-CM | POA: Diagnosis not present

## 2016-06-30 DIAGNOSIS — H43822 Vitreomacular adhesion, left eye: Secondary | ICD-10-CM | POA: Diagnosis not present

## 2016-06-30 DIAGNOSIS — H35352 Cystoid macular degeneration, left eye: Secondary | ICD-10-CM | POA: Diagnosis not present

## 2016-07-01 DIAGNOSIS — H40033 Anatomical narrow angle, bilateral: Secondary | ICD-10-CM | POA: Diagnosis not present

## 2016-07-04 DIAGNOSIS — H35352 Cystoid macular degeneration, left eye: Secondary | ICD-10-CM | POA: Diagnosis not present

## 2016-07-04 DIAGNOSIS — H33102 Unspecified retinoschisis, left eye: Secondary | ICD-10-CM | POA: Diagnosis not present

## 2016-07-04 DIAGNOSIS — H43821 Vitreomacular adhesion, right eye: Secondary | ICD-10-CM | POA: Diagnosis not present

## 2016-07-04 DIAGNOSIS — H43822 Vitreomacular adhesion, left eye: Secondary | ICD-10-CM | POA: Diagnosis not present

## 2016-07-07 HISTORY — PX: CATARACT EXTRACTION: SUR2

## 2016-07-08 DIAGNOSIS — H2512 Age-related nuclear cataract, left eye: Secondary | ICD-10-CM | POA: Diagnosis not present

## 2016-07-11 ENCOUNTER — Other Ambulatory Visit: Payer: Self-pay | Admitting: Physician Assistant

## 2016-07-20 DIAGNOSIS — H33322 Round hole, left eye: Secondary | ICD-10-CM | POA: Diagnosis not present

## 2016-07-20 DIAGNOSIS — H33312 Horseshoe tear of retina without detachment, left eye: Secondary | ICD-10-CM | POA: Diagnosis not present

## 2016-07-20 DIAGNOSIS — H43822 Vitreomacular adhesion, left eye: Secondary | ICD-10-CM | POA: Diagnosis not present

## 2016-07-20 DIAGNOSIS — H33332 Multiple defects of retina without detachment, left eye: Secondary | ICD-10-CM | POA: Diagnosis not present

## 2016-07-27 DIAGNOSIS — H33102 Unspecified retinoschisis, left eye: Secondary | ICD-10-CM | POA: Diagnosis not present

## 2016-07-27 DIAGNOSIS — H43822 Vitreomacular adhesion, left eye: Secondary | ICD-10-CM | POA: Diagnosis not present

## 2016-08-12 DIAGNOSIS — H25813 Combined forms of age-related cataract, bilateral: Secondary | ICD-10-CM | POA: Diagnosis not present

## 2016-08-12 DIAGNOSIS — H5703 Miosis: Secondary | ICD-10-CM | POA: Diagnosis not present

## 2016-08-14 ENCOUNTER — Other Ambulatory Visit: Payer: Self-pay | Admitting: Physician Assistant

## 2016-08-29 DIAGNOSIS — H43821 Vitreomacular adhesion, right eye: Secondary | ICD-10-CM | POA: Diagnosis not present

## 2016-08-29 DIAGNOSIS — H35352 Cystoid macular degeneration, left eye: Secondary | ICD-10-CM | POA: Diagnosis not present

## 2016-08-29 DIAGNOSIS — H33102 Unspecified retinoschisis, left eye: Secondary | ICD-10-CM | POA: Diagnosis not present

## 2016-09-12 DIAGNOSIS — H21562 Pupillary abnormality, left eye: Secondary | ICD-10-CM | POA: Diagnosis not present

## 2016-09-12 DIAGNOSIS — H2512 Age-related nuclear cataract, left eye: Secondary | ICD-10-CM | POA: Diagnosis not present

## 2016-09-12 DIAGNOSIS — H25812 Combined forms of age-related cataract, left eye: Secondary | ICD-10-CM | POA: Diagnosis not present

## 2016-09-19 ENCOUNTER — Other Ambulatory Visit: Payer: Self-pay | Admitting: Physician Assistant

## 2016-09-22 DIAGNOSIS — H35342 Macular cyst, hole, or pseudohole, left eye: Secondary | ICD-10-CM | POA: Diagnosis not present

## 2016-09-22 DIAGNOSIS — H35352 Cystoid macular degeneration, left eye: Secondary | ICD-10-CM | POA: Diagnosis not present

## 2016-09-27 ENCOUNTER — Other Ambulatory Visit: Payer: Self-pay | Admitting: Physician Assistant

## 2016-09-27 DIAGNOSIS — Z23 Encounter for immunization: Secondary | ICD-10-CM

## 2016-10-26 DIAGNOSIS — Z961 Presence of intraocular lens: Secondary | ICD-10-CM | POA: Diagnosis not present

## 2016-11-11 ENCOUNTER — Encounter: Payer: Self-pay | Admitting: Physician Assistant

## 2016-11-11 ENCOUNTER — Ambulatory Visit (INDEPENDENT_AMBULATORY_CARE_PROVIDER_SITE_OTHER): Payer: Medicare Other | Admitting: Physician Assistant

## 2016-11-11 VITALS — BP 123/83 | HR 97 | Temp 98.6°F | Resp 18 | Ht 67.0 in | Wt 189.8 lb

## 2016-11-11 DIAGNOSIS — K921 Melena: Secondary | ICD-10-CM | POA: Diagnosis not present

## 2016-11-11 DIAGNOSIS — E785 Hyperlipidemia, unspecified: Secondary | ICD-10-CM

## 2016-11-11 DIAGNOSIS — E669 Obesity, unspecified: Secondary | ICD-10-CM | POA: Diagnosis not present

## 2016-11-11 DIAGNOSIS — I10 Essential (primary) hypertension: Secondary | ICD-10-CM | POA: Diagnosis not present

## 2016-11-11 DIAGNOSIS — R972 Elevated prostate specific antigen [PSA]: Secondary | ICD-10-CM

## 2016-11-11 LAB — POCT URINALYSIS DIP (MANUAL ENTRY)
BILIRUBIN UA: NEGATIVE mg/dL
Bilirubin, UA: NEGATIVE
Blood, UA: NEGATIVE
Glucose, UA: NEGATIVE mg/dL
LEUKOCYTES UA: NEGATIVE
Nitrite, UA: NEGATIVE
Spec Grav, UA: 1.025 (ref 1.010–1.025)
Urobilinogen, UA: 0.2 E.U./dL
pH, UA: 5.5 (ref 5.0–8.0)

## 2016-11-11 MED ORDER — METOPROLOL SUCCINATE ER 25 MG PO TB24: 25.0000 mg | ORAL_TABLET | Freq: Every day | ORAL | 3 refills | Status: DC

## 2016-11-11 MED ORDER — LOSARTAN POTASSIUM-HCTZ 50-12.5 MG PO TABS: 1.0000 | ORAL_TABLET | Freq: Every day | ORAL | 3 refills | Status: DC

## 2016-11-11 NOTE — Assessment & Plan Note (Signed)
Will contact Dr. Elnoria HowardHung regarding intended follow-up.

## 2016-11-11 NOTE — Assessment & Plan Note (Signed)
Await labs. Adjust regimen as indicated by results.  

## 2016-11-11 NOTE — Assessment & Plan Note (Signed)
Healthy eating and regular exercise. 

## 2016-11-11 NOTE — Assessment & Plan Note (Signed)
Well controlled on losartanHCTZ and metoprolol. No changes.

## 2016-11-11 NOTE — Progress Notes (Signed)
Patient ID: Louis Bryan, male    DOB: 24-Jul-1935, 81 y.o.   MRN: [id number]  PCP: Porfirio Oar, PA-C  Chief Complaint  Patient presents with  . Hypertension    here for 6 month follow up  . Hyperlipidemia  . Follow-up    Subjective:   Presents for evaluation of HTN and hyperlipidemia.  He relates that he is doing well overall, though he has intermittent pain in the RIGHT lower leg, "from the gout, you know."  Tolerating all of his medications without difficulty.  No CP, SOB, HA, dizziness No nausea, vomiting, diarrhea. No melena. Some urgency of urination when stands from sitting.  Has received 2 doses of Shingrix vaccine.  Does not have advanced Directives. Desires CPR. Doesn't want prolonged artificial support if he is unlikely to recover.   Review of Systems  Constitutional: Negative for chills and fever.  Respiratory: Negative for cough and shortness of breath.   Cardiovascular: Negative for chest pain, palpitations and leg swelling.  Gastrointestinal: Negative for diarrhea, nausea and vomiting.  Endocrine: Negative for polydipsia.  Genitourinary: Positive for urgency. Negative for dysuria and frequency.  Musculoskeletal: Negative for myalgias.  Skin: Negative for rash.  Neurological: Negative for dizziness and headaches.  Psychiatric/Behavioral: Negative for dysphoric mood and sleep disturbance. The patient is not nervous/anxious.        Patient Active Problem List   Diagnosis Date Noted  . Rising PSA level 12/21/2015  . Blood in stool 11/26/2013  . Obesity (BMI 30-39.9) 09/29/2013  . Chronotropic incompetence - medication related 09/24/2012  . Essential hypertension 09/20/2011  . Hyperlipidemia 09/20/2011     Prior to Admission medications   Medication Sig Start Date End Date Taking? Authorizing Provider  allopurinol (ZYLOPRIM) 100 MG tablet TAKE 1 TABLET BY MOUTH EVERY DAY 07/12/16  Yes Jazzlene Huot, PA-C  aspirin 81 MG tablet Take 81 mg by  mouth daily.   Yes [provider]  Docusate Calcium (STOOL SOFTENER PO) Take by mouth Nightly.   Yes [provider]  losartan-hydrochlorothiazide (HYZAAR) 50-12.5 MG tablet TAKE 1 TABLET BY MOUTH DAILY 09/20/16  Yes English, Stephanie D, PA  metoprolol succinate (TOPROL-XL) 25 MG 24 hr tablet TAKE 1 TABLET BY MOUTH EVERY DAY WITH FOOD 08/15/16  Yes Yuma Pacella, PA-C  simvastatin (ZOCOR) 20 MG tablet TAKE 1 TABLET BY MOUTH EVERY EVENING 07/12/16  Yes Kiyoshi Schaab, PA-C     Allergies  Allergen Reactions  . Lisinopril Cough       Objective:  Physical Exam  Constitutional: He is oriented to person, place, and time. He appears well-developed and well-nourished. He is active and cooperative. No distress.  BP 123/83   Pulse 97   Temp 98.6 F (37 C) (Oral)   Resp 18   Ht 5\' 7"  (1.702 m)   Wt 189 lb 12.8 oz (86.1 kg)   SpO2 96%   BMI 29.73 kg/m   HENT:  Head: Normocephalic and atraumatic.  Right Ear: Hearing normal.  Left Ear: Hearing normal.  Eyes: Conjunctivae are normal. No scleral icterus.  Neck: Normal range of motion. Neck supple. No thyromegaly present.  Cardiovascular: Normal rate, regular rhythm and normal heart sounds.   Pulses:      Radial pulses are 2+ on the right side, and 2+ on the left side.  Pulmonary/Chest: Effort normal and breath sounds normal.  Lymphadenopathy:       Head (right side): No tonsillar, no preauricular, no posterior auricular and no occipital adenopathy present.  Head (left side): No tonsillar, no preauricular, no posterior auricular and no occipital adenopathy present.    He has no cervical adenopathy.       Right: No supraclavicular adenopathy present.       Left: No supraclavicular adenopathy present.  Neurological: He is alert and oriented to person, place, and time. No sensory deficit.  Skin: Skin is warm, dry and intact. No rash noted. No cyanosis or erythema. Nails show no clubbing.  Psychiatric: He has a normal  mood and affect. His speech is normal and behavior is normal.       Assessment & Plan:   Problem List Items Addressed This Visit    Essential hypertension - Primary (Chronic)    Well controlled on losartanHCTZ and metoprolol. No changes.      Relevant Medications   metoprolol succinate (TOPROL-XL) 25 MG 24 hr tablet   losartan-hydrochlorothiazide (HYZAAR) 50-12.5 MG tablet   Other Relevant Orders   CBC with Differential/Platelet   Comprehensive metabolic panel   TSH   POCT urinalysis dipstick (Completed)   Hyperlipidemia (Chronic)    Await labs. Adjust regimen as indicated by results.      Relevant Medications   metoprolol succinate (TOPROL-XL) 25 MG 24 hr tablet   losartan-hydrochlorothiazide (HYZAAR) 50-12.5 MG tablet   Other Relevant Orders   Comprehensive metabolic panel   Lipid panel   Obesity (BMI 30-39.9) (Chronic)    Healthy eating and regular exercise.      Blood in stool    Will contact Dr. Elnoria HowardHung regarding intended follow-up.      Rising PSA level    PSA no longer recommended.          Return in about 3 months (around 02/11/2017) for Medicare Annual Wellness Visit and follow-up blood pressure and cholesterol.   Fernande Brashelle S. Paige Monarrez, PA-C Primary Care at [facility]

## 2016-11-11 NOTE — Assessment & Plan Note (Signed)
PSA no longer recommended.

## 2016-11-11 NOTE — Patient Instructions (Addendum)
Being Mortal by Vivi FernsAtul Gawande, MD Read this and have some conversations with the people you love. Make a living will and health care power of attorney.   IF you received an x-ray today, you will receive an invoice from [facility]. Please contact [facility] at [phone] with questions or concerns regarding your invoice.   IF you received labwork today, you will receive an invoice from [facility]. Please contact [facility] at [phone] with questions or concerns regarding your invoice.   Our billing staff will not be able to assist you with questions regarding bills from these companies.  You will be contacted with the lab results as soon as they are available. The fastest way to get your results is to activate your My Chart account. Instructions are located on the last page of this paperwork. If you have not heard from us regarding the results in 2 weeks, please contact this office.

## 2016-11-12 LAB — CBC WITH DIFFERENTIAL/PLATELET
BASOS ABS: 0 10*3/uL (ref 0.0–0.2)
Basos: 0 %
EOS (ABSOLUTE): 0.2 10*3/uL (ref 0.0–0.4)
Eos: 2 %
Hematocrit: 46.2 % (ref 37.5–51.0)
Hemoglobin: 15.2 g/dL (ref 13.0–17.7)
Immature Grans (Abs): 0 10*3/uL (ref 0.0–0.1)
Immature Granulocytes: 0 %
LYMPHS ABS: 3.3 10*3/uL — AB (ref 0.7–3.1)
Lymphs: 35 %
MCH: 31 pg (ref 26.6–33.0)
MCHC: 32.9 g/dL (ref 31.5–35.7)
MCV: 94 fL (ref 79–97)
MONOS ABS: 0.8 10*3/uL (ref 0.1–0.9)
Monocytes: 9 %
NEUTROS PCT: 54 %
Neutrophils Absolute: 5.1 10*3/uL (ref 1.4–7.0)
PLATELETS: 202 10*3/uL (ref 150–379)
RBC: 4.91 x10E6/uL (ref 4.14–5.80)
RDW: 14.6 % (ref 12.3–15.4)
WBC: 9.4 10*3/uL (ref 3.4–10.8)

## 2016-11-12 LAB — LIPID PANEL
CHOLESTEROL TOTAL: 141 mg/dL (ref 100–199)
Chol/HDL Ratio: 2.9 ratio (ref 0.0–5.0)
HDL: 48 mg/dL (ref >39)
LDL CALC: 67 mg/dL (ref 0–99)
TRIGLYCERIDES: 128 mg/dL (ref 0–149)
VLDL Cholesterol Cal: 26 mg/dL (ref 5–40)

## 2016-11-12 LAB — COMPREHENSIVE METABOLIC PANEL
ALK PHOS: 101 IU/L (ref 39–117)
ALT: 11 IU/L (ref 0–44)
AST: 12 IU/L (ref 0–40)
Albumin/Globulin Ratio: 1.4 (ref 1.2–2.2)
Albumin: 4.3 g/dL (ref 3.5–4.8)
BUN/Creatinine Ratio: 11 (ref 10–24)
BUN: 14 mg/dL (ref 8–27)
Bilirubin Total: 0.9 mg/dL (ref 0.0–1.2)
CO2: 24 mmol/L (ref 20–29)
CREATININE: 1.32 mg/dL — AB (ref 0.76–1.27)
Calcium: 9.4 mg/dL (ref 8.6–10.2)
Chloride: 104 mmol/L (ref 96–106)
GFR calc Af Amer: 59 mL/min/{1.73_m2} — ABNORMAL LOW (ref >59)
GFR calc non Af Amer: 51 mL/min/{1.73_m2} — ABNORMAL LOW (ref >59)
GLOBULIN, TOTAL: 3 g/dL (ref 1.5–4.5)
GLUCOSE: 101 mg/dL — AB (ref 65–99)
Potassium: 4 mmol/L (ref 3.5–5.2)
Sodium: 144 mmol/L (ref 134–144)
Total Protein: 7.3 g/dL (ref 6.0–8.5)

## 2016-11-12 LAB — TSH: TSH: 1.22 u[IU]/mL (ref 0.450–4.500)

## 2016-11-28 DIAGNOSIS — H2511 Age-related nuclear cataract, right eye: Secondary | ICD-10-CM | POA: Diagnosis not present

## 2016-11-28 DIAGNOSIS — H33102 Unspecified retinoschisis, left eye: Secondary | ICD-10-CM | POA: Diagnosis not present

## 2016-11-28 DIAGNOSIS — H35352 Cystoid macular degeneration, left eye: Secondary | ICD-10-CM | POA: Diagnosis not present

## 2016-11-28 DIAGNOSIS — H43821 Vitreomacular adhesion, right eye: Secondary | ICD-10-CM | POA: Diagnosis not present

## 2017-02-03 ENCOUNTER — Telehealth: Payer: Self-pay

## 2017-02-03 NOTE — Telephone Encounter (Signed)
Called pt to schedule Medicare Annual Wellness Visit. -nr    Nicole Waymond Meador, B.A.  Care Guide [phone]  

## 2017-02-14 ENCOUNTER — Encounter: Payer: Self-pay | Admitting: Physician Assistant

## 2017-02-14 ENCOUNTER — Ambulatory Visit: Payer: Medicare Other | Admitting: Physician Assistant

## 2017-02-14 ENCOUNTER — Ambulatory Visit: Payer: Medicare Other | Admitting: Emergency Medicine

## 2017-02-14 ENCOUNTER — Ambulatory Visit (INDEPENDENT_AMBULATORY_CARE_PROVIDER_SITE_OTHER): Payer: Medicare Other | Admitting: Physician Assistant

## 2017-02-14 VITALS — BP 120/82 | HR 90 | Temp 98.2°F | Resp 18 | Ht 67.0 in | Wt 189.0 lb

## 2017-02-14 DIAGNOSIS — Z23 Encounter for immunization: Secondary | ICD-10-CM | POA: Diagnosis not present

## 2017-02-14 DIAGNOSIS — R7989 Other specified abnormal findings of blood chemistry: Secondary | ICD-10-CM | POA: Diagnosis not present

## 2017-02-14 DIAGNOSIS — R7303 Prediabetes: Secondary | ICD-10-CM | POA: Diagnosis not present

## 2017-02-14 DIAGNOSIS — Z0000 Encounter for general adult medical examination without abnormal findings: Secondary | ICD-10-CM | POA: Diagnosis not present

## 2017-02-14 DIAGNOSIS — E785 Hyperlipidemia, unspecified: Secondary | ICD-10-CM

## 2017-02-14 DIAGNOSIS — R7309 Other abnormal glucose: Secondary | ICD-10-CM

## 2017-02-14 DIAGNOSIS — B360 Pityriasis versicolor: Secondary | ICD-10-CM

## 2017-02-14 DIAGNOSIS — I10 Essential (primary) hypertension: Secondary | ICD-10-CM | POA: Diagnosis not present

## 2017-02-14 NOTE — Progress Notes (Signed)
Subjective:    Patient ID: Louis Bryan, male    DOB: Dec 31, 1935, 81 y.o.   MRN: [id number]  HPI Chief Complaint  Patient presents with  . Hypertension  . Hyperlipidemia  . Follow-up    Pt will get high dose flu vaccnine from pharmacy   Patient presents today for a Medicare Annual Wellness exam. Overall, patient states is doing well. He states he is compliant with his medication, taking them daily. Tolerating medications well. He does not check his BP at home as he does not own a cuff. He denies chest pain, SOB, palpitations, edema, cough, muscle pain, headache, visual changes, dizziness, lightheadedness, fever, chills, night sweats, abdominal pain, constipation, diarrhea, nausea, or vomiting. Reports nocturia (once a night), but denies hesitancy with starting stream.   Takes stool softener daily, BM daily. He will usually eat eggs and grits for breakfast. For lunch, he will have chicken or fish with vegetables. He stays hydrated with water, about 1/2 gallon daily. Stays active by mowing grass, and walking.   Home setting: 4 steps to front porch, 1 step to back porch, single story home.  Does not have an Advanced directive, full code as of now.  Rare urinary leakage due to waiting too long to use the restroom.   Review of Systems As above.  Patient Active Problem List   Diagnosis Date Noted  . BMI 29.0-29.9,adult 09/29/2013  . Chronotropic incompetence - medication related 09/24/2012  . Essential hypertension 09/20/2011  . Hyperlipidemia 09/20/2011   Prior to Admission medications   Medication Sig Start Date End Date Taking? Authorizing Provider  allopurinol (ZYLOPRIM) 100 MG tablet TAKE 1 TABLET BY MOUTH EVERY DAY 07/12/16  Yes Jeffery, [name], PA-C  aspirin 81 MG tablet Take 81 mg by mouth daily.   Yes [provider]  Docusate Calcium (STOOL SOFTENER PO) Take by mouth Nightly.   Yes [provider]  losartan-hydrochlorothiazide (HYZAAR) 50-12.5 MG tablet Take 1  tablet by mouth daily. 11/11/16  Yes Jeffery, [name], PA-C  metoprolol succinate (TOPROL-XL) 25 MG 24 hr tablet Take 1 tablet (25 mg total) by mouth daily. with food 11/11/16  Yes Jeffery, [name], PA-C  simvastatin (ZOCOR) 20 MG tablet TAKE 1 TABLET BY MOUTH EVERY EVENING 07/12/16  Yes Jeffery, [name], PA-C   Allergies  Allergen Reactions  . Lisinopril Cough   Social History   Social History  . Marital status: Single    Spouse name: divorced  . Number of children: 1  . Years of education: N/A   Occupational History  . Retired     [employer]   Social History Main Topics  . Smoking status: Former Smoker    Quit date: 06/13/1992  . Smokeless tobacco: Never Used  . Alcohol use No  . Drug use: No  . Sexual activity: Yes   Other Topics Concern  . Not on file   Social History Narrative   He walks about 30 minutes 5-6 days a week.    His daughter lives with him.   One sister lives locally.      Objective:   Physical Exam  Constitutional: He is oriented to person, place, and time. He appears well-developed and well-nourished. No distress.  HENT:  Head: Normocephalic and atraumatic.  Right Ear: External ear normal.  Left Ear: External ear normal.  Eyes: Pupils are equal, round, and reactive to light. Conjunctivae and EOM are normal. Right eye exhibits no discharge. Left eye exhibits no discharge. No scleral icterus.  Neck: Normal  range of motion. Neck supple. No JVD present. No tracheal deviation present. No thyromegaly present.  Cardiovascular: Normal rate, regular rhythm, normal heart sounds and intact distal pulses.  Exam reveals no gallop and no friction rub.   No murmur heard. Pulmonary/Chest: Effort normal and breath sounds normal. No stridor. No respiratory distress. He has no wheezes. He has no rales. He exhibits no tenderness.  Abdominal: Soft. Bowel sounds are normal. He exhibits no distension and no mass. There is no tenderness. There is no rebound and no guarding.    Lymphadenopathy:    He has no cervical adenopathy.  Neurological: He is alert and oriented to person, place, and time. He has normal reflexes.  Skin: Skin is warm and dry. No rash noted. He is not diaphoretic. No erythema. No pallor.      Assessment & Plan:  1. Medicare annual wellness visit, subsequent  2. Need for influenza vaccination - Completed in clinic today - Care order/instruction: - Flu Vaccine QUAD 36+ mos IM  3. Essential hypertension - Controlled, continue on current treatment  4. Hyperlipidemia, unspecified hyperlipidemia type - Controlled, continue on current treatment  5. Elevated serum creatinine - Comprehensive metabolic panel  6. Elevated hemoglobin A1c - Hemoglobin A1c  7. Tinea versicolor  - Recommended patient try Selsun Blue shampoo on the area. He will let us know if the rash gets worse or persists.   Respectfully, Gala Romney PA-S 2019

## 2017-02-14 NOTE — Patient Instructions (Addendum)
IF you received an x-ray today, you will receive an invoice from [facility]. Please contact [facility] at [phone] with questions or concerns regarding your invoice.   IF you received labwork today, you will receive an invoice from [place]. Please contact [facility] at [phone] with questions or concerns regarding your invoice.   Our billing staff will not be able to assist you with questions regarding bills from these companies.  You will be contacted with the lab results as soon as they are available. The fastest way to get your results is to activate your My Chart account. Instructions are located on the last page of this paperwork. If you have not heard from [place] regarding the results in 2 weeks, please contact this office.     Tinea Versicolor Tinea versicolor is a skin infection that is caused by a type of yeast. It causes a rash that shows up as light or dark patches on the skin. It often occurs on the chest, back, neck, or upper arms. The condition usually does not cause other problems. In most cases, it goes away in a few weeks with treatment. The infection cannot be spread by person to another person. Follow these instructions at home:  Take medicines only as told by your doctor.  Scrub your skin every day with a dandruff shampoo ([place]) as told by your doctor.  Do not scratch your skin in the rash area.  Avoid places that are hot and humid.  Do not use tanning booths.  Try to avoid sweating a lot. Contact a doctor if:  Your symptoms get worse.  You have a fever.  You have redness, swelling, or pain in the area of your rash.  You have fluid, blood, or pus coming from your rash.  Your rash comes back after treatment. This information is not intended to replace advice given to you by your health care provider. Make sure you discuss any questions you have with your health care provider. Document Released: 04/07/2008 Document Revised:  12/27/2015 Document Reviewed: 02/04/2014 Elsevier Interactive Patient Education  2018 [place] 79 Years and Older, Male Preventive care refers to lifestyle choices and visits with your health care provider that can promote health and wellness. What does preventive care include?  A yearly physical exam. This is also called an annual well check.  Dental exams once or twice a year.  Routine eye exams. Ask your health care provider how often you should have your eyes checked.  Personal lifestyle choices, including: ? Daily care of your teeth and gums. ? Regular physical activity. ? Eating a healthy diet. ? Avoiding tobacco and drug use. ? Limiting alcohol use. ? Practicing safe sex. ? Taking low doses of aspirin every day. ? Taking vitamin and mineral supplements as recommended by your health care provider. What happens during an annual well check? The services and screenings done by your health care provider during your annual well check will depend on your age, overall health, lifestyle risk factors, and family history of disease. Counseling Your health care provider may ask you questions about your:  Alcohol use.  Tobacco use.  Drug use.  Emotional well-being.  Home and relationship well-being.  Sexual activity.  Eating habits.  History of falls.  Memory and ability to understand (cognition).  Work and work Statistician.  Screening You may have the following tests or measurements:  Height, weight, and BMI.  Blood pressure.  Lipid and cholesterol levels. These may  be checked every 5 years, or more frequently if you are over 42 years old.  Skin check.  Lung cancer screening. You may have this screening every year starting at age 79 if you have a 30-pack-year history of smoking and currently smoke or have quit within the past 15 years.  Fecal occult blood test (FOBT) of the stool. You may have this test every year starting at age  79.  Flexible sigmoidoscopy or colonoscopy. You may have a sigmoidoscopy every 5 years or a colonoscopy every 10 years starting at age 79.  Prostate cancer screening. Recommendations will vary depending on your family history and other risks.  Hepatitis C blood test.  Hepatitis B blood test.  Sexually transmitted disease (STD) testing.  Diabetes screening. This is done by checking your blood sugar (glucose) after you have not eaten for a while (fasting). You may have this done every 1-3 years.  Abdominal aortic aneurysm (AAA) screening. You may need this if you are a current or former smoker.  Osteoporosis. You may be screened starting at age 79 if you are at high risk.  Talk with your health care provider about your test results, treatment options, and if necessary, the need for more tests. Vaccines Your health care provider may recommend certain vaccines, such as:  Influenza vaccine. This is recommended every year.  Tetanus, diphtheria, and acellular pertussis (Tdap, Td) vaccine. You may need a Td booster every 10 years.  Varicella vaccine. You may need this if you have not been vaccinated.  Zoster vaccine. You may need this after age 24.  Measles, mumps, and rubella (MMR) vaccine. You may need at least one dose of MMR if you were born in 1957 or later. You may also need a second dose.  Pneumococcal 13-valent conjugate (PCV13) vaccine. One dose is recommended after age 79.  Pneumococcal polysaccharide (PPSV23) vaccine. One dose is recommended after age 79.  Meningococcal vaccine. You may need this if you have certain conditions.  Hepatitis A vaccine. You may need this if you have certain conditions or if you travel or work in places where you may be exposed to hepatitis A.  Hepatitis B vaccine. You may need this if you have certain conditions or if you travel or work in places where you may be exposed to hepatitis B.  Haemophilus influenzae type b (Hib) vaccine. You may  need this if you have certain risk factors.  Talk to your health care provider about which screenings and vaccines you need and how often you need them. This information is not intended to replace advice given to you by your health care provider. Make sure you discuss any questions you have with your health care provider. Document Released: 05/22/2015 Document Revised: 01/13/2016 Document Reviewed: 02/24/2015 Elsevier Interactive Patient Education  2017 [employer].

## 2017-02-14 NOTE — Progress Notes (Signed)
Presents today for [employer] Visit-Subsequent.   Date of last exam: 07/23/2015  Interpreter used for this visit? no  Patient Care Team: Porfirio Oar, PA-C as PCP - General (Family Medicine) Jeani Hawking, MD as Consulting Physician (Gastroenterology) [name], MD as Consulting Physician (Cardiology)   Other items to address today:  HTN, hyperlipidemia, prediabetes  Cancer Screening: Cervical: n/a Breast: n/a Colon: no longer a candidate  Prostate: no longer a candidate   Other Screening: Last screening for diabetes: patient has prediabetes. Last A1C 05/2016 was 6.0% Last lipid screening: 05/2016, TC 169, TG 232, HDL 50, LDL 73  ADVANCE DIRECTIVES: Discussed: yes On File: no Materials Provided: yes  Immunization status:  Immunization History  Administered Date(s) Administered  . Influenza,inj,Quad PF,6+ Mos 01/22/2013, 05/27/2014, 04/07/2015  . Influenza-Unspecified 03/21/2016  . Pneumococcal Conjugate-13 11/26/2013  . Pneumococcal-Unspecified 09/29/2007  . Tdap 11/10/2009  . Zoster Recombinat (Shingrix) 07/29/2016, 09/27/2016     There are no preventive care reminders to display for this patient.   Functional Status Survey:     Home Environment: single story home with 4 steps to the front porch, 1 to the back porch.  Urinary Incontinence Screening: rare incontinence, only when he intentionally delays going to the bathroom.  Patient Active Problem List   Diagnosis Date Noted  . Obesity (BMI 30-39.9) 09/29/2013  . Chronotropic incompetence - medication related 09/24/2012  . Essential hypertension 09/20/2011  . Hyperlipidemia 09/20/2011     Past Medical History:  Diagnosis Date  . Chronotropic incompetence - medication related 09/24/2012  . History of exercise stress test [phone]   negative bruce protocol excercise stress test with scintigraphic evidence of diaphragmatic attenuation and mild apical thinning, dynamic gating  was not performed secondary to frequent ectopy, low risk study  . Hyperlipidemia   . Hypertension   . Shortness of breath    On exertion  . Tobacco abuse      Past Surgical History:  Procedure Laterality Date  . CATARACT EXTRACTION Left 07/2016  . CHOLECYSTECTOMY N/A 10/27/2012   Procedure: LAPAROSCOPIC CHOLECYSTECTOMY WITH INTRAOPERATIVE CHOLANGIOGRAM;  Surgeon: Romie Levee, MD;  Location: [facility];  Service: General;  Laterality: N/A;     Family History  Problem Relation Age of Onset  . Diabetes Sister   . Diabetes Sister      Social History   Social History  . Marital status: Single    Spouse name: divorced  . Number of children: 1  . Years of education: N/A   Occupational History  . Retired     [employer]   Social History Main Topics  . Smoking status: Former Smoker    Quit date: 06/13/1992  . Smokeless tobacco: Never Used  . Alcohol use No  . Drug use: No  . Sexual activity: Yes   Other Topics Concern  . Not on file   Social History Narrative   He walks about 30 minutes 5-6 days a week.    His daughter lives with him.   One sister lives locally.     Allergies  Allergen Reactions  . Lisinopril Cough     Prior to Admission medications   Medication Sig Start Date End Date Taking? Authorizing Provider  allopurinol (ZYLOPRIM) 100 MG tablet TAKE 1 TABLET BY MOUTH EVERY DAY 07/12/16  Yes Hyland Mollenkopf, PA-C  aspirin 81 MG tablet Take 81 mg by mouth daily.   Yes [provider]  Docusate Calcium (STOOL SOFTENER PO) Take by mouth Nightly.  Yes [provider]  losartan-hydrochlorothiazide (HYZAAR) 50-12.5 MG tablet Take 1 tablet by mouth daily. 11/11/16  Yes Malissa Slay, PA-C  metoprolol succinate (TOPROL-XL) 25 MG 24 hr tablet Take 1 tablet (25 mg total) by mouth daily. with food 11/11/16  Yes Anica Alcaraz, PA-C  simvastatin (ZOCOR) 20 MG tablet TAKE 1 TABLET BY MOUTH EVERY EVENING 07/12/16  Yes Porfirio Oar, PA-C     Depression  screen [facility] 2/9 02/14/2017 11/11/2016 06/28/2016 05/16/2016 10/02/2015  Decreased Interest 0 0 0 0 0  Down, Depressed, Hopeless 0 0 0 0 0  PHQ - 2 Score 0 0 0 0 0     Fall Risk  02/14/2017 11/11/2016 06/28/2016 05/16/2016 10/02/2015  Falls in the past year? No No No No No  Number falls in past yr: - - - - -  Injury with Fall? - - - - -      PHYSICAL EXAM: BP 120/82 (BP Location: Right Arm, Patient Position: Sitting, Cuff Size: Normal)   Pulse 90   Temp 98.2 F (36.8 C) (Oral)   Resp 18   Ht  (1.702 m)   Wt 189 lb (85.7 kg)   SpO2 96%   BMI 29.60 kg/m    Wt Readings from Last 3 Encounters:  02/14/17 189 lb (85.7 kg)  11/11/16 189 lb 12.8 oz (86.1 kg)  06/28/16 198 lb (89.8 kg)     Physical Exam  Constitutional: He is oriented to person, place, and time. He appears well-developed and well-nourished. He is active and cooperative. No distress.  HENT:  Head: Normocephalic and atraumatic.  Right Ear: Hearing normal.  Left Ear: Hearing normal.  Eyes: Conjunctivae are normal. No scleral icterus.  Neck: Phonation normal. Neck supple. No thyromegaly present.  Cardiovascular: Normal rate, regular rhythm and normal heart sounds.   Respiratory: Effort normal and breath sounds normal.  Lymphadenopathy:    He has no cervical adenopathy.  Neurological: He is alert and oriented to person, place, and time.  Skin: Skin is warm and dry. Rash noted. Rash is macular (upper trunk, consistent with tinea versicolor).  Psychiatric: He has a normal mood and affect. His speech is normal and behavior is normal.     Education/Counseling provided regarding diet and exercise, prevention of chronic diseases, smoking/tobacco cessation, if applicable, and reviewed "Covered Medicare Preventive Services."   ASSESSMENT/PLAN: Problem List Items Addressed This Visit    Essential hypertension (Chronic)    Controlled. Stable. Continue current treatment.      Hyperlipidemia (Chronic)    Await labs. Adjust  regimen as indicated by results. Has been well controlled except elevated TG, consistent with prediabetes.      Prediabetes    Other Visit Diagnoses    Medicare annual wellness visit, subsequent    -  Primary   Need for influenza vaccination       Relevant Orders   Care order/instruction: (Completed)   Flu Vaccine QUAD 36+ mos IM (Completed)   Elevated serum creatinine       Relevant Orders   Comprehensive metabolic panel (Completed)   Elevated hemoglobin A1c       Relevant Orders   Hemoglobin A1c (Completed)   Tinea versicolor       as this is asymptomatic, he does not desire prescription treatment. Encouraged use of Selsun Blue shampoo in this area.       Return in about 5 months (around 07/15/2017) for re-evalaution of blood pressure, cholesterol, etc.   Fernande Bras, PA-C Primary Care  at [facility]

## 2017-02-15 LAB — COMPREHENSIVE METABOLIC PANEL
ALT: 29 IU/L (ref 0–44)
AST: 22 IU/L (ref 0–40)
Albumin/Globulin Ratio: 1.3 (ref 1.2–2.2)
Albumin: 4.3 g/dL (ref 3.5–4.8)
Alkaline Phosphatase: 99 IU/L (ref 39–117)
BUN/Creatinine Ratio: 13 (ref 10–24)
BUN: 14 mg/dL (ref 8–27)
Bilirubin Total: 0.5 mg/dL (ref 0.0–1.2)
CALCIUM: 9.7 mg/dL (ref 8.6–10.2)
CO2: 22 mmol/L (ref 20–29)
CREATININE: 1.12 mg/dL (ref 0.76–1.27)
Chloride: 101 mmol/L (ref 96–106)
GFR calc Af Amer: 72 mL/min/{1.73_m2} (ref >59)
GFR, EST NON AFRICAN AMERICAN: 62 mL/min/{1.73_m2} (ref >59)
GLOBULIN, TOTAL: 3.3 g/dL (ref 1.5–4.5)
GLUCOSE: 88 mg/dL (ref 65–99)
Potassium: 4.9 mmol/L (ref 3.5–5.2)
Sodium: 142 mmol/L (ref 134–144)
Total Protein: 7.6 g/dL (ref 6.0–8.5)

## 2017-02-15 LAB — HEMOGLOBIN A1C
ESTIMATED AVERAGE GLUCOSE: 134 mg/dL
Hgb A1c MFr Bld: 6.3 % — ABNORMAL HIGH (ref 4.8–5.6)

## 2017-02-16 ENCOUNTER — Encounter: Payer: Self-pay | Admitting: Physician Assistant

## 2017-02-16 DIAGNOSIS — R7303 Prediabetes: Secondary | ICD-10-CM | POA: Insufficient documentation

## 2017-02-16 NOTE — Assessment & Plan Note (Signed)
Await labs. Adjust regimen as indicated by results. Has been well controlled except elevated TG, consistent with prediabetes.

## 2017-02-16 NOTE — Assessment & Plan Note (Signed)
Controlled. Stable. Continue current treatment. 

## 2017-02-17 NOTE — Progress Notes (Signed)
Letter sent.

## 2017-02-28 ENCOUNTER — Ambulatory Visit (INDEPENDENT_AMBULATORY_CARE_PROVIDER_SITE_OTHER): Payer: Medicare Other | Admitting: Physician Assistant

## 2017-02-28 ENCOUNTER — Telehealth: Payer: Self-pay | Admitting: Family Medicine

## 2017-02-28 ENCOUNTER — Encounter: Payer: Self-pay | Admitting: Physician Assistant

## 2017-02-28 VITALS — BP 128/86 | HR 98 | Temp 98.1°F | Resp 18 | Ht 67.0 in | Wt 192.2 lb

## 2017-02-28 DIAGNOSIS — B360 Pityriasis versicolor: Secondary | ICD-10-CM | POA: Diagnosis not present

## 2017-02-28 MED ORDER — ITRACONAZOLE 200 MG PO TABS: 200.0000 mg | ORAL_TABLET | Freq: Every day | ORAL | 0 refills | Status: AC

## 2017-02-28 NOTE — Progress Notes (Signed)
     Patient ID: Louis Bryan, male    DOB: 04/24/1936, 81 y.o.   MRN: [id number]  PCP: Porfirio OarJeffery, Zuzu Befort, PA-C  Chief Complaint  Patient presents with  . Rash    x6 months, chest, pt states the rash was red but not anymore but still itches.    Subjective:   Presents for evaluation of tinea versicolor.  I saw him 02/14/2017 for AWV. At that time, he pointed out a rash on the chest.  He was diagnosed with tinea versicolor and advised to use Selsun Blue shampoo topically. After 3-4 days, he noticed that the skin was getting really dry and "wrinkled." he discontinued use. The itching is nearly resolved.   Review of Systems As above    Patient Active Problem List   Diagnosis Date Noted  . Prediabetes 02/16/2017  . BMI 29.0-29.9,adult 09/29/2013  . Chronotropic incompetence - medication related 09/24/2012  . Essential hypertension 09/20/2011  . Hyperlipidemia 09/20/2011     Prior to Admission medications   Medication Sig Start Date End Date Taking? Authorizing Provider  allopurinol (ZYLOPRIM) 100 MG tablet TAKE 1 TABLET BY MOUTH EVERY DAY 07/12/16  Yes Roselinda Bahena, PA-C  aspirin 81 MG tablet Take 81 mg by mouth daily.   Yes [provider]  Docusate Calcium (STOOL SOFTENER PO) Take by mouth Nightly.   Yes [provider]  losartan-hydrochlorothiazide (HYZAAR) 50-12.5 MG tablet Take 1 tablet by mouth daily. 11/11/16  Yes Marvene Strohm, PA-C  metoprolol succinate (TOPROL-XL) 25 MG 24 hr tablet Take 1 tablet (25 mg total) by mouth daily. with food 11/11/16  Yes Yair Dusza, PA-C  simvastatin (ZOCOR) 20 MG tablet TAKE 1 TABLET BY MOUTH EVERY EVENING 07/12/16  Yes Amarya Kuehl, PA-C     Allergies  Allergen Reactions  . Lisinopril Cough       Objective:  Physical Exam  Constitutional: He is oriented to person, place, and time. He appears well-developed and well-nourished. He is active and cooperative. No distress.  BP 128/86 (BP Location: Left Arm,  Patient Position: Sitting, Cuff Size: Normal)   Pulse 98   Temp 98.1 F (36.7 C) (Oral)   Resp 18   Ht 5\' 7"  (1.702 m)   Wt 192 lb 3.2 oz (87.2 kg)   SpO2 96%   BMI 30.10 kg/m    Eyes: Conjunctivae are normal.  Pulmonary/Chest: Effort normal.  Neurological: He is alert and oriented to person, place, and time.  Skin: Rash noted. Rash is macular.     Psychiatric: He has a normal mood and affect. His speech is normal and behavior is normal.           Assessment & Plan:   1. Tinea versicolor Moisturize the skin daily. Change to itraconazole orally. Hold simvastatin for the 5 days of treatment. - Itraconazole 200 MG TABS; Take 200 mg by mouth daily.  Dispense: 5 tablet; Refill: 0    Return if symptoms worsen or fail to improve.   Fernande Brashelle S. Hades Mathew, PA-C Primary Care at [facility]

## 2017-02-28 NOTE — Patient Instructions (Addendum)
While you are taking this new medication, DO NOT TAKE the simvastatin (Zocor). Once you complete it, resume the simvastatin.  Moisturize the skin that got irritated by the [facility].    IF you received an x-ray today, you will receive an invoice from [facility]. Please contact [facility] at [phone] with questions or concerns regarding your invoice.   IF you received labwork today, you will receive an invoice from [facility]. Please contact [facility] at [phone] with questions or concerns regarding your invoice.   Our billing staff will not be able to assist you with questions regarding bills from these companies.  You will be contacted with the lab results as soon as they are available. The fastest way to get your results is to activate your My Chart account. Instructions are located on the last page of this paperwork. If you have not heard from us regarding the results in 2 weeks, please contact this office.

## 2017-02-28 NOTE — Telephone Encounter (Signed)
[name] called from pharmacy stating that they don't carry the Itraconazole 200mg   He would like to change the RX to the Generic brand of 100 mg twp caps daily it would be much cheaper for pt also I spoke with [name] she said that it was ok to change the RX to Generic 100mg  2 tabs daily

## 2017-03-01 NOTE — Telephone Encounter (Signed)
[facility]  Today.   Should take up to 72 hrs for a response.  Key code is [id number].

## 2017-03-02 NOTE — Telephone Encounter (Signed)
[facility] from [facility] stating patient's Itraconazole capsule is approved until 05/08/2018.  Patient aware.

## 2017-03-13 DIAGNOSIS — H2511 Age-related nuclear cataract, right eye: Secondary | ICD-10-CM | POA: Diagnosis not present

## 2017-03-13 DIAGNOSIS — H43821 Vitreomacular adhesion, right eye: Secondary | ICD-10-CM | POA: Diagnosis not present

## 2017-03-13 DIAGNOSIS — H35352 Cystoid macular degeneration, left eye: Secondary | ICD-10-CM | POA: Diagnosis not present

## 2017-03-13 DIAGNOSIS — H33102 Unspecified retinoschisis, left eye: Secondary | ICD-10-CM | POA: Diagnosis not present

## 2017-07-11 ENCOUNTER — Encounter: Payer: Self-pay | Admitting: Physician Assistant

## 2017-07-11 ENCOUNTER — Ambulatory Visit (INDEPENDENT_AMBULATORY_CARE_PROVIDER_SITE_OTHER): Payer: Medicare Other | Admitting: Physician Assistant

## 2017-07-11 ENCOUNTER — Other Ambulatory Visit: Payer: Self-pay

## 2017-07-11 VITALS — BP 132/78 | HR 74 | Temp 98.3°F | Resp 16 | Ht 67.0 in | Wt 193.4 lb

## 2017-07-11 DIAGNOSIS — R7303 Prediabetes: Secondary | ICD-10-CM | POA: Diagnosis not present

## 2017-07-11 DIAGNOSIS — I10 Essential (primary) hypertension: Secondary | ICD-10-CM

## 2017-07-11 DIAGNOSIS — E785 Hyperlipidemia, unspecified: Secondary | ICD-10-CM | POA: Diagnosis not present

## 2017-07-11 DIAGNOSIS — M1A0710 Idiopathic chronic gout, right ankle and foot, without tophus (tophi): Secondary | ICD-10-CM

## 2017-07-11 DIAGNOSIS — M109 Gout, unspecified: Secondary | ICD-10-CM | POA: Insufficient documentation

## 2017-07-11 MED ORDER — ALLOPURINOL 100 MG PO TABS: 100.0000 mg | ORAL_TABLET | Freq: Every day | ORAL | 3 refills | Status: AC

## 2017-07-11 MED ORDER — SIMVASTATIN 20 MG PO TABS: 20.0000 mg | ORAL_TABLET | Freq: Every evening | ORAL | 3 refills | Status: DC

## 2017-07-11 NOTE — Progress Notes (Signed)
Subjective:    Patient ID: Louis Bryan, male    DOB: 11/04/1935, 82 y.o.   MRN: [id number]   Chief Complaint  Patient presents with  . Hyperlipidemia    follow up  . Hypertension    follow up  . Diabetes    follow up (pre-diabetes)    HPI Patient presents for re-evaluation of HTN, hyperlipidemia, and diabetes. Patient states that he has been "doing okay" since his last visit with us five months ago. He has not experienced any notable changes, pertinent negatives include no polydipsia, polyuria, change in weight, or change in appetite.Patient has two sister's that have diabetes mellitus. He is currently pre-diabetic. Today he will have a lipid panel, hemoglobin A1c, CBC and CMP updated.  Patient had tinea versicolor on 02/28/2017 which was treated with oral Itraconazole. Patient states tinea versicolor is "still a little bit there," but that is has improved dramatically and is "just about gone." He is no longer taking any medication to treat it.   Patient has gout flares around every 1-2 weeks in his right ankle. Movement alleviates the pain. It is currently well controlled on Allopurinol.  Patient ees a dentist annually, and is due to schedule his annual appointment as soon as possible. Patient sees an ophthalmologist (since left cataract removal,) and is scheduled for a follow up next month.  Patient Active Problem List   Diagnosis Date Noted  . Gout 07/11/2017  . Prediabetes 02/16/2017  . BMI 29.0-29.9,adult 09/29/2013  . Chronotropic incompetence - medication related 09/24/2012  . Essential hypertension 09/20/2011  . Hyperlipidemia 09/20/2011   Allergies  Allergen Reactions  . Lisinopril Cough   Prior to Admission medications   Medication Sig Start Date End Date Taking? Authorizing Provider  allopurinol (ZYLOPRIM) 100 MG tablet Take 1 tablet (100 mg total) by mouth daily. 07/11/17  Yes Jeffery, [name], PA-C  aspirin 81 MG tablet Take 81 mg by mouth daily.   Yes [provider]  Docusate Calcium (STOOL SOFTENER PO) Take by mouth Nightly.   Yes [provider]  losartan-hydrochlorothiazide (HYZAAR) 50-12.5 MG tablet Take 1 tablet by mouth daily. 11/11/16  Yes Jeffery, [name], PA-C  metoprolol succinate (TOPROL-XL) 25 MG 24 hr tablet Take 1 tablet (25 mg total) by mouth daily. with food 11/11/16  Yes Jeffery, [name], PA-C  simvastatin (ZOCOR) 20 MG tablet Take 1 tablet (20 mg total) by mouth every evening. 07/11/17  Yes Porfirio OarJeffery, [name], PA-C    Past Medical History:  Diagnosis Date  . Chronotropic incompetence - medication related 09/24/2012  . History of exercise stress test [phone]   negative bruce protocol excercise stress test with scintigraphic evidence of diaphragmatic attenuation and mild apical thinning, dynamic gating was not performed secondary to frequent ectopy, low risk study  . Hyperlipidemia   . Hypertension   . Shortness of breath    On exertion  . Tobacco abuse    Social History   Socioeconomic History  . Marital status: Single    Spouse name: divorced  . Number of children: 1  . Years of education: Not on file  . Highest education level: Not on file  Social Needs  . Financial resource strain: Not on file  . Food insecurity - worry: Not on file  . Food insecurity - inability: Not on file  . Transportation needs - medical: Not on file  . Transportation needs - non-medical: Not on file  Occupational History  . Occupation: Retired    Comment: [employer]  Tobacco Use  . Smoking status: Former Smoker    Last attempt to quit: 06/13/1992    Years since quitting: 25.0  . Smokeless tobacco: Never Used  Substance and Sexual Activity  . Alcohol use: No  . Drug use: No  . Sexual activity: Yes  Other Topics Concern  . Not on file  Social History Narrative   He cuts his grass with a push mover in the summer time for exercise. He wants to start back walking.    His daughter lives with him.   One sister lives locally.   Family  History  Problem Relation Age of Onset  . Diabetes Sister   . Diabetes Sister    Past Surgical History:  Procedure Laterality Date  . CATARACT EXTRACTION Left 07/2016  . CHOLECYSTECTOMY N/A 10/27/2012   Procedure: LAPAROSCOPIC CHOLECYSTECTOMY WITH INTRAOPERATIVE CHOLANGIOGRAM;  Surgeon: Romie Levee, MD;  Location: [facility];  Service: General;  Laterality: N/A;    Review of Systems  Constitutional: Negative.  Negative for activity change, appetite change and unexpected weight change.  HENT: Positive for hearing loss and sinus pressure (Occasional.). Negative for congestion, dental problem, sinus pain and tinnitus.   Eyes: Positive for photophobia (Left eye sensitive to light since cataract removal in 2018. ). Negative for discharge and visual disturbance.  Respiratory: Positive for cough ("Every once in a while."), shortness of breath and wheezing (On occasion.). Negative for chest tightness.   Cardiovascular: Negative.  Negative for chest pain, palpitations and leg swelling.  Gastrointestinal: Negative.  Negative for constipation, diarrhea, nausea and vomiting.  Endocrine: Negative.  Negative for polydipsia and polyuria.  Genitourinary: Negative.  Negative for difficulty urinating, dysuria and frequency.  Musculoskeletal: Positive for arthralgias (Gout in right ankle.) and back pain. Negative for gait problem, myalgias, neck pain and neck stiffness.  Skin: Negative.  Negative for color change.       Still some tinea versicolor on chest.  Neurological: Positive for headaches. Negative for dizziness, light-headedness and numbness.  Psychiatric/Behavioral: Positive for sleep disturbance ("I wake up through the night."). Negative for dysphoric mood.       Objective:   Physical Exam  Constitutional: He is oriented to person, place, and time. He appears well-developed and well-nourished.  BP 132/78   Pulse 74   Temp 98.3 F (36.8 C)   Resp 16   Ht 5\' 7"  (1.702 m)   Wt 193 lb 6.4 oz  (87.7 kg)   SpO2 96%   BMI 30.29 kg/m   HENT:  Head: Normocephalic and atraumatic.  Right Ear: External ear normal.  Left Ear: External ear normal.  Nose: Nose normal.  Mouth/Throat: Oropharynx is clear and moist.  Partial edentulism.  Eyes: Conjunctivae and EOM are normal. Pupils are equal, round, and reactive to light.  Neck: Normal range of motion. Neck supple.  Cardiovascular: Normal rate, regular rhythm, normal heart sounds and intact distal pulses.  Pulmonary/Chest: Effort normal.  Abdominal: Soft. Bowel sounds are normal.  Musculoskeletal: Normal range of motion.  Neurological: He is alert and oriented to person, place, and time. He has normal reflexes.  Skin: Skin is warm and dry. Rash noted.  Trace patches of hypopigmentation  (tinea versicolor,) remain around neck.   Psychiatric: He has a normal mood and affect. His behavior is normal. Judgment and thought content normal.     Wt Readings from Last 3 Encounters:  07/11/17 193 lb 6.4 oz (87.7 kg)  02/28/17 192 lb 3.2 oz (87.2 kg)  02/14/17 189 lb (  85.7 kg)       Assessment & Plan:   1. Essential hypertension Patient's blood pressure was 132/78 mm Hg today. It is currently well controlled on Losartan and Metoprolol.  - CBC with Differential/Platelet - Comprehensive metabolic panel  2. Hyperlipidemia, unspecified hyperlipidemia type Patient's last lipid panel was on 07/06/ 2018. At this time his total cholesterol was 141 mg/dL, his triglycerides were 128 mg/dL, his HDL was 48 md/dL, and his LDL was 67 mg/dL. His lipid panel was updated today, as was a CMP. Patient is currently managing cholesterol with Simvastatin. Medication changes will not be made unless results of today's lab indicate need for adjustment.   - Comprehensive metabolic panel - Lipid panel - simvastatin (ZOCOR) 20 MG tablet; Take 1 tablet (20 mg total) by mouth every evening.  Dispense: 90 tablet; Refill: 3  3. Prediabetes Patient's last  hemoglobin A1c was 6.3% on 02/14/2017. Both of patient's sisters have diabetes. Patient has had no polydipsia, polyuria, change in appetite or weight since his last visit. Potential medication adjustments may be made pending results of today's lab.  - Comprehensive metabolic panel - Hemoglobin A1c  4. Chronic gout of right ankle, unspecified cause Patient's gout is well-controlled on Allopurinol. He states that he has flares every 1-2 weeks and that they are brief and resolve with movement. No further intervention for this is indicated at this time.  - allopurinol (ZYLOPRIM) 100 MG tablet; Take 1 tablet (100 mg total) by mouth daily.  Dispense: 90 tablet; Refill: 3  Return in about 6 months (around 01/11/2018) for re-evaluation of blood pressure, cholesterol  and pre-diabetes.

## 2017-07-11 NOTE — Patient Instructions (Signed)
     IF you received an x-ray today, you will receive an invoice from [facility]. Please contact [facility] at [phone] with questions or concerns regarding your invoice.   IF you received labwork today, you will receive an invoice from [facility]. Please contact [facility] at [phone] with questions or concerns regarding your invoice.   Our billing staff will not be able to assist you with questions regarding bills from these companies.  You will be contacted with the lab results as soon as they are available. The fastest way to get your results is to activate your My Chart account. Instructions are located on the last page of this paperwork. If you have not heard from us regarding the results in 2 weeks, please contact this office.     

## 2017-07-11 NOTE — Progress Notes (Signed)
Patient ID: Louis Bryan, male    DOB: [birth year], 82 y.o.   MRN: [id number]  PCP: Porfirio Oar, PA-C  Chief Complaint  Patient presents with  . Hyperlipidemia    follow up  . Hypertension    follow up  . Diabetes    follow up (pre-diabetes)    Subjective:   Presents for evaluation of HTN, hyperlipidemia and prediabetes.  Last labs 02/14/2017  A1C 6.3%  creatinine 1.12, GFR 72 11/11/2016 LDL 67  Tinea versicolor lesions are significantly improved from his visit in 02/2017, following oral itraconazole. Recall that he developed severe dryness with Selsun Blue shampoo.  He feels well. Gout flares can occur Q2-3 weeks. Usually brief. Require no treatment. Usually occurs at night, and usually resolves when he changes position.  Tolerating his medications well, without adverse effects.  Review of Systems Constitutional: Negative.  Negative for activity change, appetite change and unexpected weight change.  HENT: Positive for hearing loss and sinus pressure (Occasional.). Negative for congestion, dental problem, sinus pain and tinnitus.   Eyes: Positive for photophobia (Left eye sensitive to light since cataract removal in 2018. ). Negative for discharge and visual disturbance.  Respiratory: Positive for cough ("Every once in a while."), shortness of breath and wheezing (On occasion.). Negative for chest tightness.   Cardiovascular: Negative.  Negative for chest pain, palpitations and leg swelling.  Gastrointestinal: Negative.  Negative for constipation, diarrhea, nausea and vomiting.  Endocrine: Negative.  Negative for polydipsia and polyuria.  Genitourinary: Negative.  Negative for difficulty urinating, dysuria and frequency.  Musculoskeletal: Positive for arthralgias (Gout in right ankle.) and back pain. Negative for gait problem, myalgias, neck pain and neck stiffness.  Skin: Negative.  Negative for color change.       Still some tinea versicolor on chest.  Neurological:  Positive for headaches. Negative for dizziness, light-headedness and numbness.  Psychiatric/Behavioral: Positive for sleep disturbance ("I wake up through the night."). Negative for dysphoric mood.     Patient Active Problem List   Diagnosis Date Noted  . Prediabetes 02/16/2017  . BMI 29.0-29.9,adult 09/29/2013  . Chronotropic incompetence - medication related 09/24/2012  . Essential hypertension 09/20/2011  . Hyperlipidemia 09/20/2011     Prior to Admission medications   Medication Sig Start Date End Date Taking? Authorizing Provider  allopurinol (ZYLOPRIM) 100 MG tablet TAKE 1 TABLET BY MOUTH EVERY DAY 07/12/16  Yes Lytle Malburg, PA-C  aspirin 81 MG tablet Take 81 mg by mouth daily.   Yes [provider]  Docusate Calcium (STOOL SOFTENER PO) Take by mouth Nightly.   Yes [provider]  losartan-hydrochlorothiazide (HYZAAR) 50-12.5 MG tablet Take 1 tablet by mouth daily. 11/11/16  Yes Elienai Gailey, PA-C  metoprolol succinate (TOPROL-XL) 25 MG 24 hr tablet Take 1 tablet (25 mg total) by mouth daily. with food 11/11/16  Yes Tammra Pressman, PA-C  simvastatin (ZOCOR) 20 MG tablet TAKE 1 TABLET BY MOUTH EVERY EVENING 07/12/16  Yes Dnaiel Voller, PA-C     Allergies  Allergen Reactions  . Lisinopril Cough       Objective:  Physical Exam  Constitutional: He is oriented to person, place, and time. He appears well-developed and well-nourished. He is active and cooperative. No distress.  BP 132/78   Pulse 74   Temp 98.3 F (36.8 C)   Resp 16   Ht 5\' 7"  (1.702 m)   Wt 193 lb 6.4 oz (87.7 kg)   SpO2 96%   BMI 30.29 kg/m  HENT:  Head: Normocephalic and atraumatic.  Right Ear: Hearing normal.  Left Ear: Hearing normal.  Eyes: Conjunctivae are normal. No scleral icterus.  Neck: Normal range of motion. Neck supple. No thyromegaly present.  Cardiovascular: Normal rate, regular rhythm and normal heart sounds.  Pulses:      Radial pulses are 2+ on the right  side, and 2+ on the left side.  Pulmonary/Chest: Effort normal and breath sounds normal.  Lymphadenopathy:       Head (right side): No tonsillar, no preauricular, no posterior auricular and no occipital adenopathy present.       Head (left side): No tonsillar, no preauricular, no posterior auricular and no occipital adenopathy present.    He has no cervical adenopathy.       Right: No supraclavicular adenopathy present.       Left: No supraclavicular adenopathy present.  Neurological: He is alert and oriented to person, place, and time. No sensory deficit.  Skin: Skin is warm, dry and intact. Rash (trace hypopigmentation of the upper chest) noted. No cyanosis or erythema. Nails show no clubbing.  Psychiatric: He has a normal mood and affect. His speech is normal and behavior is normal.   Wt Readings from Last 3 Encounters:  07/11/17 193 lb 6.4 oz (87.7 kg)  02/28/17 192 lb 3.2 oz (87.2 kg)  02/14/17 189 lb (85.7 kg)           Assessment & Plan:   Problem List Items Addressed This Visit    Essential hypertension - Primary (Chronic)    Controlled.  Continue losartan and metoprolol.      Relevant Medications   simvastatin (ZOCOR) 20 MG tablet   Other Relevant Orders   CBC with Differential/Platelet (Completed)   Comprehensive metabolic panel (Completed)   Hyperlipidemia (Chronic)    Well-controlled at last visit.  Anticipate starting today.  Continue simvastatin.  Would increase from 20 mg to 40 mg daily if LDL has risen above 70.      Relevant Medications   simvastatin (ZOCOR) 20 MG tablet   Other Relevant Orders   Comprehensive metabolic panel (Completed)   Lipid panel (Completed)   Prediabetes    Await A1c.  If indicates he now has diabetes would initiate metformin.  Encouraged healthy lifestyle modifications.      Relevant Orders   Comprehensive metabolic panel (Completed)   Hemoglobin A1c (Completed)   Gout    Well-controlled.  I doubt that the very brief  episodes of pain he is experiencing are due to this so no dose adjustment      Relevant Medications   allopurinol (ZYLOPRIM) 100 MG tablet       Return in about 6 months (around 01/11/2018) for re-evaluation of blood pressure, cholesterol  and pre-diabetes.   Fernande Brashelle S. Eva Vallee, PA-C Primary Care at [facility]

## 2017-07-12 LAB — CBC WITH DIFFERENTIAL/PLATELET
BASOS ABS: 0 10*3/uL (ref 0.0–0.2)
BASOS: 0 %
EOS (ABSOLUTE): 0.2 10*3/uL (ref 0.0–0.4)
Eos: 2 %
Hematocrit: 45.6 % (ref 37.5–51.0)
Hemoglobin: 15 g/dL (ref 13.0–17.7)
IMMATURE GRANS (ABS): 0 10*3/uL (ref 0.0–0.1)
IMMATURE GRANULOCYTES: 0 %
LYMPHS: 36 %
Lymphocytes Absolute: 3.3 10*3/uL — ABNORMAL HIGH (ref 0.7–3.1)
MCH: 30.6 pg (ref 26.6–33.0)
MCHC: 32.9 g/dL (ref 31.5–35.7)
MCV: 93 fL (ref 79–97)
MONOS ABS: 0.8 10*3/uL (ref 0.1–0.9)
Monocytes: 9 %
NEUTROS PCT: 53 %
Neutrophils Absolute: 4.7 10*3/uL (ref 1.4–7.0)
Platelets: 210 10*3/uL (ref 150–379)
RBC: 4.9 x10E6/uL (ref 4.14–5.80)
RDW: 14.7 % (ref 12.3–15.4)
WBC: 9 10*3/uL (ref 3.4–10.8)

## 2017-07-12 LAB — COMPREHENSIVE METABOLIC PANEL
A/G RATIO: 1.3 (ref 1.2–2.2)
ALT: 16 IU/L (ref 0–44)
AST: 17 IU/L (ref 0–40)
Albumin: 4.3 g/dL (ref 3.5–4.7)
Alkaline Phosphatase: 97 IU/L (ref 39–117)
BILIRUBIN TOTAL: 0.7 mg/dL (ref 0.0–1.2)
BUN/Creatinine Ratio: 11 (ref 10–24)
BUN: 12 mg/dL (ref 8–27)
CHLORIDE: 105 mmol/L (ref 96–106)
CO2: 26 mmol/L (ref 20–29)
Calcium: 9.6 mg/dL (ref 8.6–10.2)
Creatinine, Ser: 1.12 mg/dL (ref 0.76–1.27)
GFR calc Af Amer: 71 mL/min/{1.73_m2} (ref >59)
GFR calc non Af Amer: 62 mL/min/{1.73_m2} (ref >59)
GLUCOSE: 110 mg/dL — AB (ref 65–99)
Globulin, Total: 3.2 g/dL (ref 1.5–4.5)
POTASSIUM: 4.1 mmol/L (ref 3.5–5.2)
Sodium: 147 mmol/L — ABNORMAL HIGH (ref 134–144)
Total Protein: 7.5 g/dL (ref 6.0–8.5)

## 2017-07-12 LAB — LIPID PANEL
CHOLESTEROL TOTAL: 164 mg/dL (ref 100–199)
Chol/HDL Ratio: 3.1 ratio (ref 0.0–5.0)
HDL: 53 mg/dL (ref >39)
LDL Calculated: 86 mg/dL (ref 0–99)
TRIGLYCERIDES: 123 mg/dL (ref 0–149)
VLDL Cholesterol Cal: 25 mg/dL (ref 5–40)

## 2017-07-12 LAB — HEMOGLOBIN A1C
ESTIMATED AVERAGE GLUCOSE: 137 mg/dL
HEMOGLOBIN A1C: 6.4 % — AB (ref 4.8–5.6)

## 2017-07-13 NOTE — Assessment & Plan Note (Signed)
Await A1c.  If indicates he now has diabetes would initiate metformin.  Encouraged healthy lifestyle modifications.

## 2017-07-13 NOTE — Assessment & Plan Note (Signed)
Controlled.  Continue losartan and metoprolol.

## 2017-07-13 NOTE — Assessment & Plan Note (Signed)
Well-controlled at last visit.  Anticipate starting today.  Continue simvastatin.  Would increase from 20 mg to 40 mg daily if LDL has risen above 70.

## 2017-07-13 NOTE — Assessment & Plan Note (Signed)
Well-controlled.  I doubt that the very brief episodes of pain he is experiencing are due to this so no dose adjustment

## 2017-07-20 DIAGNOSIS — H43821 Vitreomacular adhesion, right eye: Secondary | ICD-10-CM | POA: Diagnosis not present

## 2017-07-20 DIAGNOSIS — Z961 Presence of intraocular lens: Secondary | ICD-10-CM | POA: Diagnosis not present

## 2017-07-20 DIAGNOSIS — H25811 Combined forms of age-related cataract, right eye: Secondary | ICD-10-CM | POA: Diagnosis not present

## 2017-07-20 DIAGNOSIS — H35352 Cystoid macular degeneration, left eye: Secondary | ICD-10-CM | POA: Diagnosis not present

## 2017-07-20 DIAGNOSIS — H26492 Other secondary cataract, left eye: Secondary | ICD-10-CM | POA: Diagnosis not present

## 2017-08-07 ENCOUNTER — Encounter: Payer: Self-pay | Admitting: Physician Assistant

## 2017-08-14 ENCOUNTER — Encounter: Payer: Self-pay | Admitting: Physician Assistant

## 2017-08-28 ENCOUNTER — Ambulatory Visit (INDEPENDENT_AMBULATORY_CARE_PROVIDER_SITE_OTHER): Payer: Medicare Other | Admitting: Physician Assistant

## 2017-08-28 ENCOUNTER — Encounter: Payer: Self-pay | Admitting: Physician Assistant

## 2017-08-28 ENCOUNTER — Other Ambulatory Visit: Payer: Self-pay

## 2017-08-28 VITALS — BP 122/80 | HR 98 | Temp 98.4°F | Resp 16 | Ht 67.0 in | Wt 194.8 lb

## 2017-08-28 DIAGNOSIS — R7303 Prediabetes: Secondary | ICD-10-CM

## 2017-08-28 DIAGNOSIS — E785 Hyperlipidemia, unspecified: Secondary | ICD-10-CM

## 2017-08-28 DIAGNOSIS — I10 Essential (primary) hypertension: Secondary | ICD-10-CM

## 2017-08-28 MED ORDER — METOPROLOL SUCCINATE ER 25 MG PO TB24: 25.0000 mg | ORAL_TABLET | Freq: Every day | ORAL | 3 refills | Status: AC

## 2017-08-28 MED ORDER — LOSARTAN POTASSIUM-HCTZ 50-12.5 MG PO TABS: 1.0000 | ORAL_TABLET | Freq: Every day | ORAL | 3 refills | Status: AC

## 2017-08-28 NOTE — Assessment & Plan Note (Signed)
Creeping A1C, 6.4% 07/2017. Healthy lifestyle changes recommended. Will start metformin if rises to 7%.

## 2017-08-28 NOTE — Progress Notes (Signed)
Patient ID: Louis Bryan, male    DOB: 09/06/1935, 82 y.o.   MRN: [id number]  PCP: Porfirio OarJeffery, Ibtisam Benge, PA-C  Chief Complaint  Patient presents with  . Hypertension    Subjective:   Presents for evaluation of HTN, hyperlipidemia and prediabetes.  Labs were drawn 4 weeks ago. A1C steadily rising, up to 6.4%. LDL 86. CBC, hepatic and renal function normal.  He feels good. No CP, SOB, HA, dizziness. Continues to tolerate medications well.  Review of Systems  Constitutional: Negative for chills and fever.  Respiratory: Negative for cough and shortness of breath.   Cardiovascular: Negative for chest pain, palpitations and leg swelling.  Gastrointestinal: Negative for diarrhea, nausea and vomiting.  Endocrine: Negative for polydipsia.  Genitourinary: Negative for dysuria, frequency and urgency.  Musculoskeletal: Negative for myalgias.  Skin: Negative for rash.  Neurological: Negative for dizziness and headaches.       Patient Active Problem List   Diagnosis Date Noted  . Gout 07/11/2017  . Prediabetes 02/16/2017  . BMI 29.0-29.9,adult 09/29/2013  . Chronotropic incompetence - medication related 09/24/2012  . Essential hypertension 09/20/2011  . Hyperlipidemia 09/20/2011     Prior to Admission medications   Medication Sig Start Date End Date Taking? Authorizing Provider  allopurinol (ZYLOPRIM) 100 MG tablet Take 1 tablet (100 mg total) by mouth daily. 07/11/17  Yes Dakota Stangl, PA-C  aspirin 81 MG tablet Take 81 mg by mouth daily.   Yes [provider]  Docusate Calcium (STOOL SOFTENER PO) Take by mouth Nightly.   Yes [provider]  losartan-hydrochlorothiazide (HYZAAR) 50-12.5 MG tablet Take 1 tablet by mouth daily. 11/11/16  Yes Mance Vallejo, PA-C  metoprolol succinate (TOPROL-XL) 25 MG 24 hr tablet Take 1 tablet (25 mg total) by mouth daily. with food 11/11/16  Yes Jancarlos Thrun, PA-C  simvastatin (ZOCOR) 20 MG tablet Take 1 tablet (20 mg total)  by mouth every evening. 07/11/17  Yes Porfirio OarJeffery, Sevyn Paredez, PA-C     Allergies  Allergen Reactions  . Lisinopril Cough       Objective:  Physical Exam  Constitutional: He is oriented to person, place, and time. He appears well-developed and well-nourished. He is active and cooperative. No distress.  BP 122/80   Pulse 98   Temp 98.4 F (36.9 C)   Resp 16   Ht 5\' 7"  (1.702 m)   Wt 194 lb 12.8 oz (88.4 kg)   SpO2 96%   BMI 30.51 kg/m   HENT:  Head: Normocephalic and atraumatic.  Right Ear: Hearing normal.  Left Ear: Hearing normal.  Clears throat several times during exam  Eyes: Conjunctivae are normal. No scleral icterus.  Neck: Normal range of motion. Neck supple. No thyromegaly present.  Cardiovascular: Normal rate, regular rhythm and normal heart sounds.  Pulses:      Radial pulses are 2+ on the right side, and 2+ on the left side.  Pulmonary/Chest: Effort normal and breath sounds normal.  Lymphadenopathy:       Head (right side): No tonsillar, no preauricular, no posterior auricular and no occipital adenopathy present.       Head (left side): No tonsillar, no preauricular, no posterior auricular and no occipital adenopathy present.    He has no cervical adenopathy.       Right: No supraclavicular adenopathy present.       Left: No supraclavicular adenopathy present.  Neurological: He is alert and oriented to person, place, and time. No sensory deficit.  Skin: Skin is  warm, dry and intact. No rash noted. No cyanosis or erythema. Nails show no clubbing.  Psychiatric: He has a normal mood and affect. His speech is normal and behavior is normal.     Wt Readings from Last 3 Encounters:  08/28/17 194 lb 12.8 oz (88.4 kg)  07/11/17 193 lb 6.4 oz (87.7 kg)  02/28/17 192 lb 3.2 oz (87.2 kg)         Assessment & Plan:   Problem List Items Addressed This Visit    Essential hypertension - Primary (Chronic)    Well controlled. Continue Hyzaar and Toprol XL. No changes in  treatment. Healthy lifestyle encouraged.      Relevant Medications   losartan-hydrochlorothiazide (HYZAAR) 50-12.5 MG tablet   metoprolol succinate (TOPROL-XL) 25 MG 24 hr tablet   Hyperlipidemia (Chronic)    Good control, with LDL 86. Continue simvastatin 20 mg.      Relevant Medications   losartan-hydrochlorothiazide (HYZAAR) 50-12.5 MG tablet   metoprolol succinate (TOPROL-XL) 25 MG 24 hr tablet   Prediabetes    Creeping A1C, 6.4% 07/2017. Healthy lifestyle changes recommended. Will start metformin if rises to 7%.          Return in about 5 months (around 01/28/2018) for re-evaluation of blood pressure, cholesterol and blood sugar.   Fernande Bras, PA-C Primary Care at [facility]

## 2017-08-28 NOTE — Assessment & Plan Note (Signed)
Good control, with LDL 86. Continue simvastatin 20 mg.

## 2017-08-28 NOTE — Assessment & Plan Note (Signed)
Well controlled. Continue Hyzaar and Toprol XL. No changes in treatment. Healthy lifestyle encouraged.

## 2017-08-28 NOTE — Patient Instructions (Addendum)
Keep up the good work! Make healthy eating choices and stay active!    IF you received an x-ray today, you will receive an invoice from [facility]. Please contact [facility] at [phone] with questions or concerns regarding your invoice.   IF you received labwork today, you will receive an invoice from [facility]. Please contact [facility] at [phone] with questions or concerns regarding your invoice.   Our billing staff will not be able to assist you with questions regarding bills from these companies.  You will be contacted with the lab results as soon as they are available. The fastest way to get your results is to activate your My Chart account. Instructions are located on the last page of this paperwork. If you have not heard from us regarding the results in 2 weeks, please contact this office.

## 2017-08-31 ENCOUNTER — Telehealth: Payer: Self-pay | Admitting: Physician Assistant

## 2017-08-31 NOTE — Telephone Encounter (Signed)
Tried to call pt to advise them of [name] leaving the practice. There was no VM set up so I was unable to leave a message. If pt calls back, please schedule them with one of the following providers that are accepting new pts. : Roseanna Rainbowlark, Stallings, Lurline IdolSagardia, Santiago, Wiseman, McVey or EmilyMani.  Thanks!

## 2017-09-11 DIAGNOSIS — H2511 Age-related nuclear cataract, right eye: Secondary | ICD-10-CM | POA: Diagnosis not present

## 2017-09-11 DIAGNOSIS — H43821 Vitreomacular adhesion, right eye: Secondary | ICD-10-CM | POA: Diagnosis not present

## 2017-09-11 DIAGNOSIS — H33102 Unspecified retinoschisis, left eye: Secondary | ICD-10-CM | POA: Diagnosis not present

## 2017-09-11 DIAGNOSIS — H35352 Cystoid macular degeneration, left eye: Secondary | ICD-10-CM | POA: Diagnosis not present

## 2017-10-30 ENCOUNTER — Telehealth: Payer: Self-pay | Admitting: Cardiology

## 2017-10-30 NOTE — Telephone Encounter (Signed)
Tried to call the patient on cell phone, but, no VM set up.  Called home phone, which has been disconnected.

## 2018-01-09 ENCOUNTER — Ambulatory Visit: Payer: Medicare Other | Admitting: Physician Assistant

## 2018-01-10 DIAGNOSIS — E785 Hyperlipidemia, unspecified: Secondary | ICD-10-CM | POA: Diagnosis not present

## 2018-01-10 DIAGNOSIS — I10 Essential (primary) hypertension: Secondary | ICD-10-CM | POA: Diagnosis not present

## 2018-01-10 DIAGNOSIS — R7303 Prediabetes: Secondary | ICD-10-CM | POA: Diagnosis not present

## 2018-01-10 DIAGNOSIS — Z23 Encounter for immunization: Secondary | ICD-10-CM | POA: Diagnosis not present

## 2018-01-10 DIAGNOSIS — I4589 Other specified conduction disorders: Secondary | ICD-10-CM | POA: Diagnosis not present

## 2018-01-18 DIAGNOSIS — H43821 Vitreomacular adhesion, right eye: Secondary | ICD-10-CM | POA: Diagnosis not present

## 2018-01-18 DIAGNOSIS — Z961 Presence of intraocular lens: Secondary | ICD-10-CM | POA: Diagnosis not present

## 2018-01-18 DIAGNOSIS — H35352 Cystoid macular degeneration, left eye: Secondary | ICD-10-CM | POA: Diagnosis not present

## 2018-01-18 DIAGNOSIS — H25811 Combined forms of age-related cataract, right eye: Secondary | ICD-10-CM | POA: Diagnosis not present

## 2018-02-16 IMAGING — CR DG CHEST 2V
2 series · 2 of 2 positions shown · non-contrast
Comparison: 07/09/2014

CLINICAL DATA: Cough for 4 days, wheezing

EXAM:
CHEST  2 VIEW

[lateral]
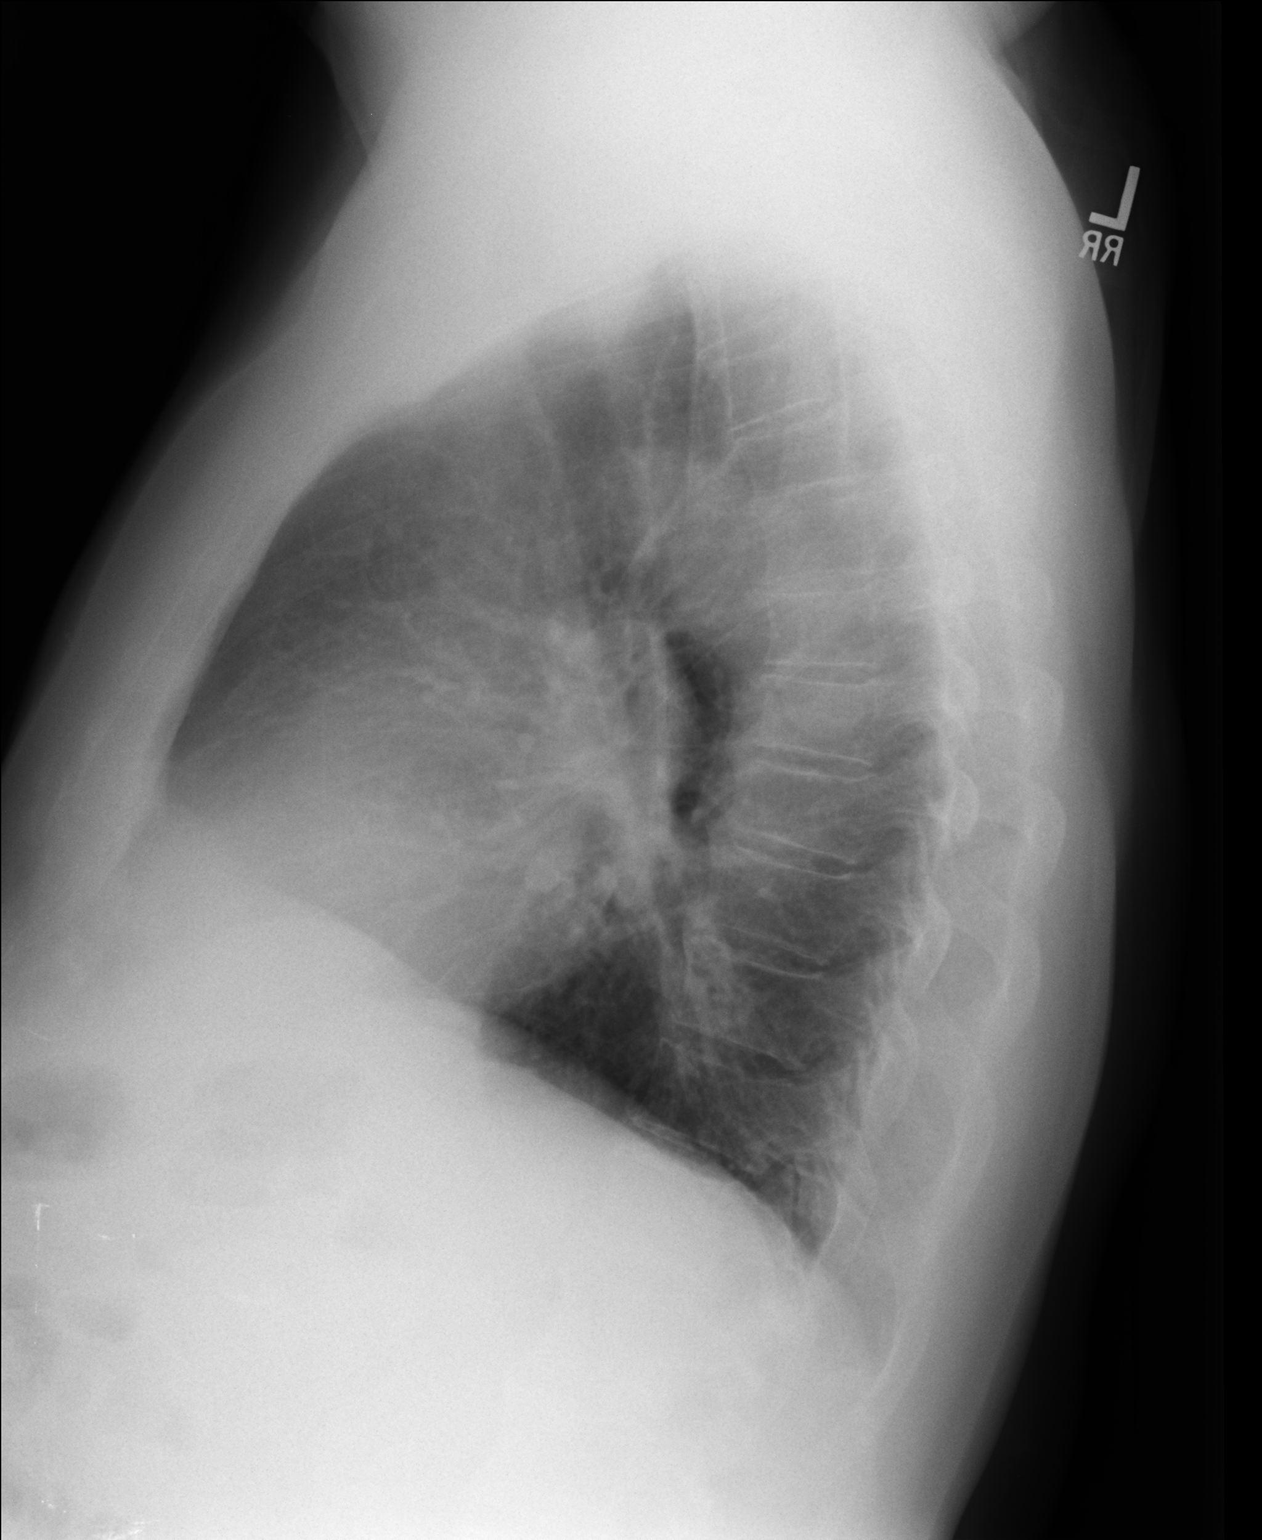

[PA]
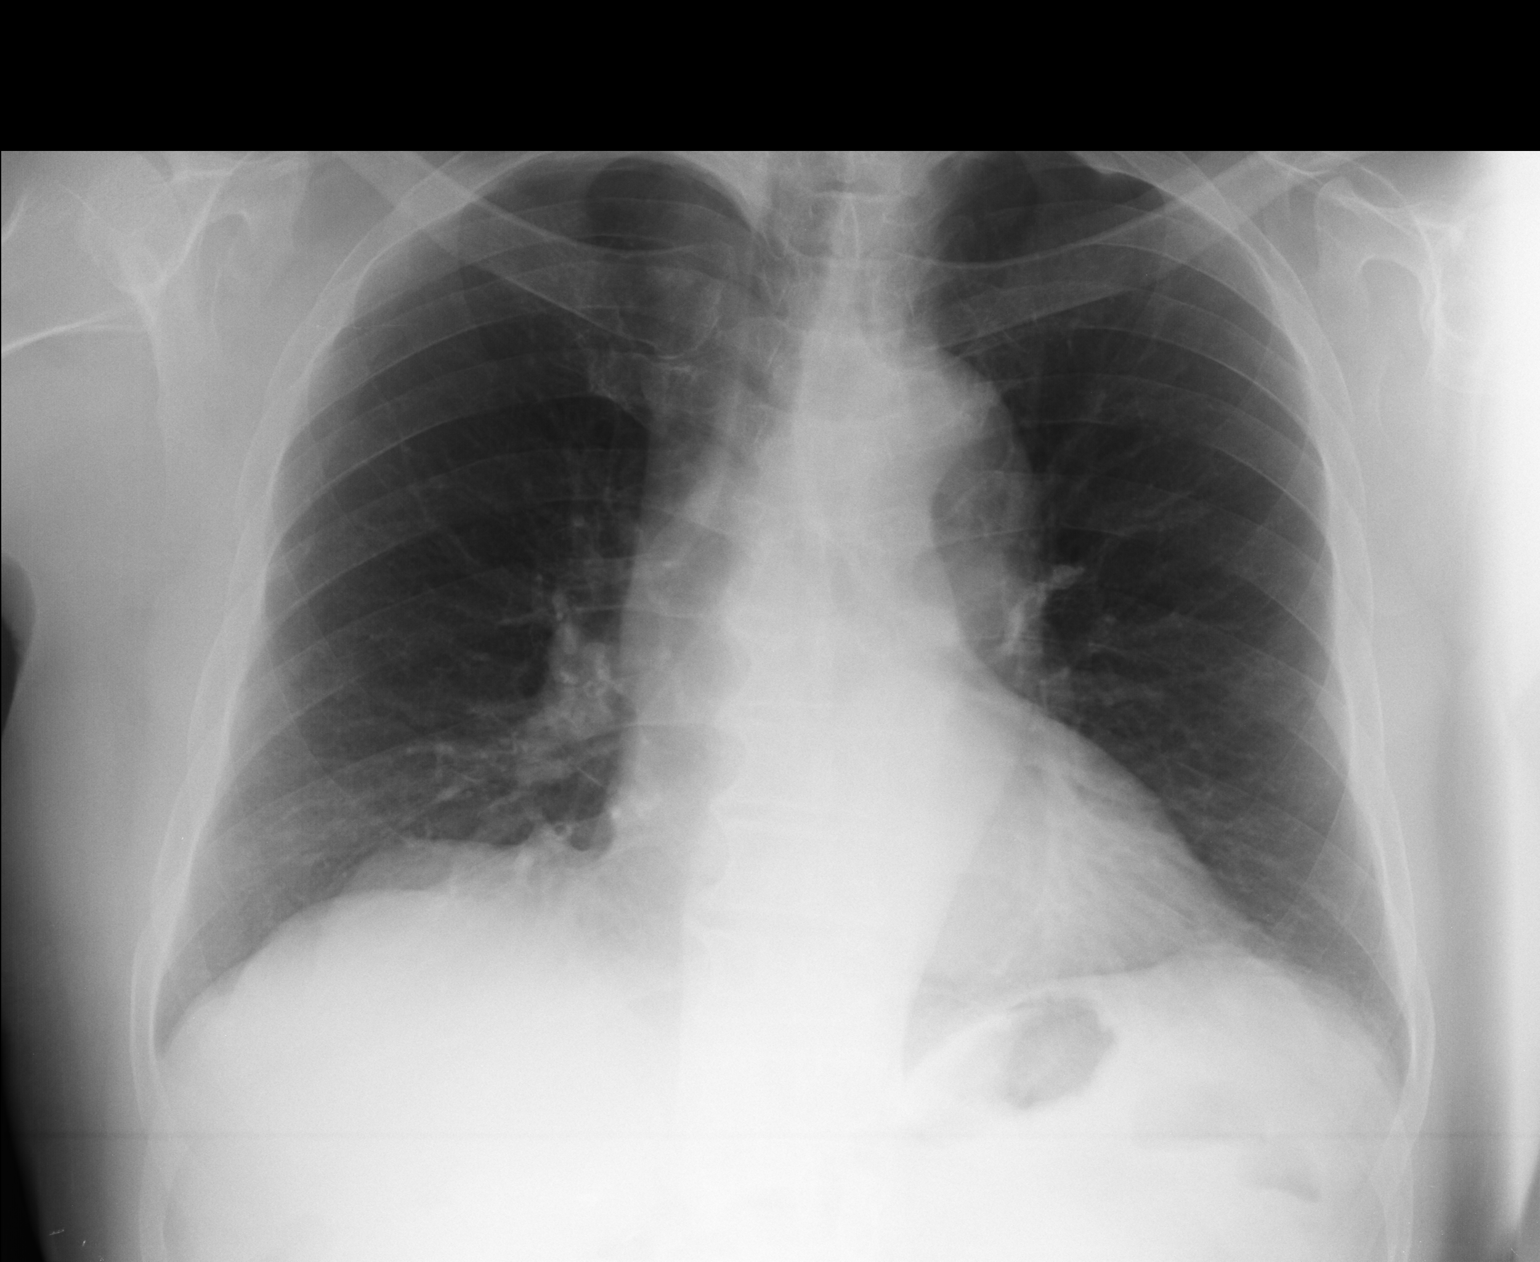

[2 of 2 positions shown; findings below may reference images not displayed]

FINDINGS: Cardiomediastinal silhouette is stable. Mild elevation of the right
hemidiaphragm. There is streaky left base retrocardiac atelectasis
or infiltrate. No pulmonary edema. Mild degenerative changes
thoracic spine.
IMPRESSION: Mild elevation of the right hemidiaphragm. Streaky left base
retrocardiac atelectasis or infiltrate. No pulmonary edema. Mild
degenerative changes thoracic spine.

## 2018-04-11 DIAGNOSIS — H9190 Unspecified hearing loss, unspecified ear: Secondary | ICD-10-CM | POA: Diagnosis not present

## 2018-04-11 DIAGNOSIS — Z0000 Encounter for general adult medical examination without abnormal findings: Secondary | ICD-10-CM | POA: Diagnosis not present

## 2018-04-11 DIAGNOSIS — H543 Unqualified visual loss, both eyes: Secondary | ICD-10-CM | POA: Diagnosis not present

## 2018-04-11 DIAGNOSIS — I10 Essential (primary) hypertension: Secondary | ICD-10-CM | POA: Diagnosis not present

## 2018-04-11 DIAGNOSIS — R7303 Prediabetes: Secondary | ICD-10-CM | POA: Diagnosis not present

## 2018-04-11 DIAGNOSIS — E785 Hyperlipidemia, unspecified: Secondary | ICD-10-CM | POA: Diagnosis not present

## 2018-04-26 DIAGNOSIS — H25811 Combined forms of age-related cataract, right eye: Secondary | ICD-10-CM | POA: Diagnosis not present

## 2018-04-26 DIAGNOSIS — H35352 Cystoid macular degeneration, left eye: Secondary | ICD-10-CM | POA: Diagnosis not present

## 2018-04-26 DIAGNOSIS — H43821 Vitreomacular adhesion, right eye: Secondary | ICD-10-CM | POA: Diagnosis not present

## 2018-04-26 DIAGNOSIS — Z961 Presence of intraocular lens: Secondary | ICD-10-CM | POA: Diagnosis not present

## 2018-05-12 ENCOUNTER — Encounter (HOSPITAL_COMMUNITY): Payer: Self-pay | Admitting: *Deleted

## 2018-05-12 ENCOUNTER — Ambulatory Visit (HOSPITAL_COMMUNITY)
Admission: EM | Admit: 2018-05-12 | Discharge: 2018-05-12 | Disposition: A | Discharge status: Home / Self Care | Payer: Medicare Other | Attending: Family Medicine | Admitting: Family Medicine

## 2018-05-12 ENCOUNTER — Other Ambulatory Visit: Payer: Self-pay

## 2018-05-12 ENCOUNTER — Ambulatory Visit (INDEPENDENT_AMBULATORY_CARE_PROVIDER_SITE_OTHER): Payer: Medicare Other

## 2018-05-12 DIAGNOSIS — R109 Unspecified abdominal pain: Secondary | ICD-10-CM | POA: Diagnosis not present

## 2018-05-12 DIAGNOSIS — A084 Viral intestinal infection, unspecified: Secondary | ICD-10-CM | POA: Insufficient documentation

## 2018-05-12 DIAGNOSIS — R1031 Right lower quadrant pain: Secondary | ICD-10-CM | POA: Diagnosis not present

## 2018-05-12 DIAGNOSIS — K5289 Other specified noninfective gastroenteritis and colitis: Secondary | ICD-10-CM

## 2018-05-12 LAB — POCT URINALYSIS DIP (DEVICE)
BILIRUBIN URINE: NEGATIVE
Glucose, UA: NEGATIVE mg/dL
HGB URINE DIPSTICK: NEGATIVE
Ketones, ur: NEGATIVE mg/dL
Leukocytes, UA: NEGATIVE
Nitrite: NEGATIVE
PROTEIN: NEGATIVE mg/dL
Specific Gravity, Urine: 1.015 (ref 1.005–1.030)
UROBILINOGEN UA: 0.2 mg/dL (ref 0.0–1.0)
pH: 8.5 — ABNORMAL HIGH (ref 5.0–8.0)

## 2018-05-12 MED ORDER — ONDANSETRON HCL 4 MG PO TABS: 4.0000 mg | ORAL_TABLET | Freq: Four times a day (QID) | ORAL | 0 refills | Status: DC

## 2018-05-12 NOTE — ED Triage Notes (Signed)
C/o roght side pain onset 2 days ago denies injury, denies n/v

## 2018-05-12 NOTE — ED Provider Notes (Addendum)
[facility]   [id number] 05/12/18 Arrival Time: 1022  CC: ABDOMINAL DISCOMFORT  SUBJECTIVE:  Louis Bryan is a 83 y.o. male who presents with complaint of abdominal discomfort that began 1-2 days ago.  Denies a precipitating event, trauma, close contacts with similar symptoms, recent travel or antibiotic use.  Localizes pain to RT abdomen and flank  Describes as intermittent and sharp in character.  Has tried laxative without relief.  Denies association with food, worse with "certain movements" and laying down on RT side.  Reports similar symptoms in the past with gallstones, had gallbladder removed.  Last BM yesterday and looser after use of laxative.    Denies fever, chills, appetite changes, weight changes, nausea, vomiting, chest pain, SOB, diarrhea, constipation, hematochezia, melena, dysuria, difficulty urinating, increased frequency or urgency, loss of bowel or bladder function.  No LMP for male patient.  ROS: As per HPI.  Past Medical History:  Diagnosis Date  . Chronotropic incompetence - medication related 09/24/2012  . History of exercise stress test [phone]   negative bruce protocol excercise stress test with scintigraphic evidence of diaphragmatic attenuation and mild apical thinning, dynamic gating was not performed secondary to frequent ectopy, low risk study  . Hyperlipidemia   . Hypertension   . Shortness of breath    On exertion  . Tobacco abuse    Past Surgical History:  Procedure Laterality Date  . CATARACT EXTRACTION Left 07/2016  . CHOLECYSTECTOMY N/A 10/27/2012   Procedure: LAPAROSCOPIC CHOLECYSTECTOMY WITH INTRAOPERATIVE CHOLANGIOGRAM;  Surgeon: Romie Levee, MD;  Location: [facility];  Service: General;  Laterality: N/A;   No Active Allergies No current facility-administered medications on file prior to encounter.    Current Outpatient Medications on File Prior to Encounter  Medication Sig Dispense Refill  . allopurinol (ZYLOPRIM) 100 MG tablet Take 1  tablet (100 mg total) by mouth daily. 90 tablet 3  . aspirin 81 MG tablet Take 81 mg by mouth daily.    Tery Sanfilippo Calcium (STOOL SOFTENER PO) Take by mouth Nightly.    Marland Kitchen losartan-hydrochlorothiazide (HYZAAR) 50-12.5 MG tablet Take 1 tablet by mouth daily. 90 tablet 3  . metoprolol succinate (TOPROL-XL) 25 MG 24 hr tablet Take 1 tablet (25 mg total) by mouth daily. with food 90 tablet 3  . simvastatin (ZOCOR) 20 MG tablet Take 1 tablet (20 mg total) by mouth every evening. 90 tablet 3   Social History   Socioeconomic History  . Marital status: Single    Spouse name: divorced  . Number of children: 1  . Years of education: Not on file  . Highest education level: Not on file  Occupational History  . Occupation: Retired    Comment: Systems analyst  Social Needs  . Financial resource strain: Not on file  . Food insecurity:    Worry: Not on file    Inability: Not on file  . Transportation needs:    Medical: Not on file    Non-medical: Not on file  Tobacco Use  . Smoking status: Former Smoker    Last attempt to quit: 06/13/1992    Years since quitting: 25.9  . Smokeless tobacco: Never Used  Substance and Sexual Activity  . Alcohol use: No  . Drug use: No  . Sexual activity: Yes  Lifestyle  . Physical activity:    Days per week: Not on file    Minutes per session: Not on file  . Stress: Not on file  Relationships  . Social connections:  Talks on phone: Not on file    Gets together: Not on file    Attends religious service: Not on file    Active member of club or organization: Not on file    Attends meetings of clubs or organizations: Not on file    Relationship status: Not on file  . Intimate partner violence:    Fear of current or ex partner: Not on file    Emotionally abused: Not on file    Physically abused: Not on file    Forced sexual activity: Not on file  Other Topics Concern  . Not on file  Social History Narrative   He cuts his grass with a push mover in the  summer time for exercise. He wants to start back walking.    His daughter lives with him.   One sister lives locally.   Family History  Problem Relation Age of Onset  . Diabetes Sister   . Diabetes Sister      OBJECTIVE:  Vitals:   05/12/18 1105  BP: 130/84  Pulse: 89  Resp: 18  Temp: 98.4 F (36.9 C)  TempSrc: Oral  SpO2: 97%    General appearance: Alert; NAD HEENT: NCAT.  Oropharynx clear.  Lungs: clear to auscultation bilaterally without adventitious breath sounds Heart: regular rate and rhythm.  Radial pulses 2+ symmetrical bilaterally Abdomen: soft, non-distended; normal active bowel sounds; mildly tender to palpation over RT flank; nontender at McBurney's point; negative rebound; no guarding Back: no CVA tenderness Extremities: no edema; symmetrical with no gross deformities Skin: warm and dry Neurologic: normal gait Psychological: alert and cooperative; normal mood and affect  LABS: Results for orders placed or performed during the hospital encounter of 05/12/18 (from the past 24 hour(s))  POCT urinalysis dip (device)     Status: Abnormal   Collection Time: 05/12/18 11:54 AM  Result Value Ref Range   Glucose, UA NEGATIVE NEGATIVE mg/dL   Bilirubin Urine NEGATIVE NEGATIVE   Ketones, ur NEGATIVE NEGATIVE mg/dL   Specific Gravity, Urine 1.015 1.005 - 1.030   Hgb urine dipstick NEGATIVE NEGATIVE   pH 8.5 (H) 5.0 - 8.0   Protein, ur NEGATIVE NEGATIVE mg/dL   Urobilinogen, UA 0.2 0.0 - 1.0 mg/dL   Nitrite NEGATIVE NEGATIVE   Leukocytes, UA NEGATIVE NEGATIVE    DIAGNOSTIC STUDIES: Dg Abd 2 Views  Result Date: 05/12/2018 CLINICAL DATA:  83 year old male with right lower quadrant abdominal pain EXAM: ABDOMEN - 2 VIEW COMPARISON:  Prior acute abdominal series 10/27/2012 FINDINGS: Surgical clips in the right upper quadrant suggest prior cholecystectomy. No evidence of free air. Linear densities overlie the lung bases bilaterally consistent with atelectasis. Gas is  present within multiple loops of small bowel in the left mid abdomen. There appears to be mild thickening of the valvulae conniventes consistent with submucosal edema. No acute osseous abnormality. IMPRESSION: Mild thickening of the valvulae conniventes in a few loops of small bowel in the left mid abdomen concerning for possible gastroenteritis. Electronically Signed   By: Malachy MoanHeath  McCullough M.D.   On: 05/12/2018 12:17     ASSESSMENT & PLAN:  1. Viral gastroenteritis   2. Abdominal discomfort in right flank     Meds ordered this encounter  Medications  . ondansetron (ZOFRAN) 4 MG tablet    Sig: Take 1 tablet (4 mg total) by mouth every 6 (six) hours.    Dispense:  12 tablet    Refill:  0    Order Specific Question:   Supervising  Provider    Answer:   Eustace MooreELSON, YVONNE SUE [[id number]]   Urine did not show signs of kidney stone or UTI X-rays showed possible gastroenteritis which is viral illness that can cause stomach upset Get rest and drink fluids Zofran prescribed.  Take as directed.    DIET Instructions:  30 minutes after taking nausea medicine, begin with sips of clear liquids. If able to hold down 2 - 4 ounces for 30 minutes, begin drinking more. Increase your fluid intake to replace losses. Clear liquids only for 24 hours (water, tea, sport drinks, clear flat ginger ale or cola and juices, broth, jello, popsicles, ect). Advance to bland foods, applesauce, rice, baked or boiled chicken, ect. Avoid milk, greasy foods and anything that doesn't agree with you.  If you experience new or worsening symptoms return or go to ER such as fever, chills, nausea, vomiting, diarrhea, bloody or dark tarry stools, constipation, urinary symptoms, worsening abdominal discomfort, symptoms that do not improve with medications, inability to keep fluids down, etc...  Reviewed expectations re: course of current medical issues. Questions answered. Outlined signs and symptoms indicating need for more acute  intervention. Patient verbalized understanding. After Visit Summary given.   Rennis HardingWurst, Perris Tripathi, PA-C 05/12/18 1353    Alvino ChapelWurst, [place], PA-C 05/12/18 1353

## 2018-05-12 NOTE — Discharge Instructions (Signed)
X-rays showed possible gastroenteritis which is viral illness that can cause stomach upset Get rest and drink fluids Zofran prescribed.  Take as directed.    DIET Instructions:  30 minutes after taking nausea medicine, begin with sips of clear liquids. If able to hold down 2 - 4 ounces for 30 minutes, begin drinking more. Increase your fluid intake to replace losses. Clear liquids only for 24 hours (water, tea, sport drinks, clear flat ginger ale or cola and juices, broth, jello, popsicles, ect). Advance to bland foods, applesauce, rice, baked or boiled chicken, ect. Avoid milk, greasy foods and anything that doesnt agree with you.  If you experience new or worsening symptoms return or go to ER such as fever, chills, nausea, vomiting, diarrhea, bloody or dark tarry stools, constipation, urinary symptoms, worsening abdominal discomfort, symptoms that do not improve with medications, inability to keep fluids down, etc..Marland Kitchen

## 2018-05-21 DIAGNOSIS — Z9889 Other specified postprocedural states: Secondary | ICD-10-CM | POA: Diagnosis not present

## 2018-05-21 DIAGNOSIS — H2511 Age-related nuclear cataract, right eye: Secondary | ICD-10-CM | POA: Diagnosis not present

## 2018-05-21 DIAGNOSIS — H43821 Vitreomacular adhesion, right eye: Secondary | ICD-10-CM | POA: Diagnosis not present

## 2018-05-21 DIAGNOSIS — H33102 Unspecified retinoschisis, left eye: Secondary | ICD-10-CM | POA: Diagnosis not present

## 2018-07-12 DIAGNOSIS — M79622 Pain in left upper arm: Secondary | ICD-10-CM | POA: Diagnosis not present

## 2018-07-12 DIAGNOSIS — E785 Hyperlipidemia, unspecified: Secondary | ICD-10-CM | POA: Diagnosis not present

## 2018-07-12 DIAGNOSIS — M79605 Pain in left leg: Secondary | ICD-10-CM | POA: Diagnosis not present

## 2018-07-12 DIAGNOSIS — R7303 Prediabetes: Secondary | ICD-10-CM | POA: Diagnosis not present

## 2018-07-12 DIAGNOSIS — I10 Essential (primary) hypertension: Secondary | ICD-10-CM | POA: Diagnosis not present

## 2018-07-12 DIAGNOSIS — M79604 Pain in right leg: Secondary | ICD-10-CM | POA: Diagnosis not present

## 2018-08-16 ENCOUNTER — Other Ambulatory Visit: Payer: Self-pay

## 2018-08-16 NOTE — Patient Outreach (Signed)
[facility] Care Management  08/16/2018  Lindal Tomita 1936-04-15 [id number]   Medication Adherence call to Mr. Kelen Ryals spoke with patient he is due on Losartan /Hctz 50/12.5 mg patient said he is busy to call him back patient is showing past due under [facility] Ins.   Lillia Abed CPhT Pharmacy Technician [facility] Direct Dial [phone]  Fax [phone] [email or web address]

## 2018-10-12 DIAGNOSIS — M79622 Pain in left upper arm: Secondary | ICD-10-CM | POA: Diagnosis not present

## 2018-10-12 DIAGNOSIS — I10 Essential (primary) hypertension: Secondary | ICD-10-CM | POA: Diagnosis not present

## 2018-10-12 DIAGNOSIS — E785 Hyperlipidemia, unspecified: Secondary | ICD-10-CM | POA: Diagnosis not present

## 2018-10-12 DIAGNOSIS — R7303 Prediabetes: Secondary | ICD-10-CM | POA: Diagnosis not present

## 2018-11-19 DIAGNOSIS — H33102 Unspecified retinoschisis, left eye: Secondary | ICD-10-CM | POA: Diagnosis not present

## 2018-11-19 DIAGNOSIS — H2511 Age-related nuclear cataract, right eye: Secondary | ICD-10-CM | POA: Diagnosis not present

## 2018-11-19 DIAGNOSIS — Z961 Presence of intraocular lens: Secondary | ICD-10-CM | POA: Diagnosis not present

## 2018-11-19 DIAGNOSIS — H43821 Vitreomacular adhesion, right eye: Secondary | ICD-10-CM | POA: Diagnosis not present

## 2019-01-11 DIAGNOSIS — R7303 Prediabetes: Secondary | ICD-10-CM | POA: Diagnosis not present

## 2019-01-11 DIAGNOSIS — Z03818 Encounter for observation for suspected exposure to other biological agents ruled out: Secondary | ICD-10-CM | POA: Diagnosis not present

## 2019-01-11 DIAGNOSIS — I10 Essential (primary) hypertension: Secondary | ICD-10-CM | POA: Diagnosis not present

## 2019-01-11 DIAGNOSIS — R05 Cough: Secondary | ICD-10-CM | POA: Diagnosis not present

## 2019-01-11 DIAGNOSIS — E785 Hyperlipidemia, unspecified: Secondary | ICD-10-CM | POA: Diagnosis not present

## 2019-01-24 DIAGNOSIS — H35352 Cystoid macular degeneration, left eye: Secondary | ICD-10-CM | POA: Diagnosis not present

## 2019-01-24 DIAGNOSIS — Z961 Presence of intraocular lens: Secondary | ICD-10-CM | POA: Diagnosis not present

## 2019-01-24 DIAGNOSIS — H43821 Vitreomacular adhesion, right eye: Secondary | ICD-10-CM | POA: Diagnosis not present

## 2019-01-24 DIAGNOSIS — Z9889 Other specified postprocedural states: Secondary | ICD-10-CM | POA: Diagnosis not present

## 2019-01-24 DIAGNOSIS — H25811 Combined forms of age-related cataract, right eye: Secondary | ICD-10-CM | POA: Diagnosis not present

## 2019-02-04 DIAGNOSIS — H25011 Cortical age-related cataract, right eye: Secondary | ICD-10-CM | POA: Diagnosis not present

## 2019-02-04 DIAGNOSIS — H5703 Miosis: Secondary | ICD-10-CM | POA: Diagnosis not present

## 2019-02-04 DIAGNOSIS — H21561 Pupillary abnormality, right eye: Secondary | ICD-10-CM | POA: Diagnosis not present

## 2019-02-04 DIAGNOSIS — H2511 Age-related nuclear cataract, right eye: Secondary | ICD-10-CM | POA: Diagnosis not present

## 2019-02-04 DIAGNOSIS — H25811 Combined forms of age-related cataract, right eye: Secondary | ICD-10-CM | POA: Diagnosis not present

## 2019-02-19 DIAGNOSIS — I10 Essential (primary) hypertension: Secondary | ICD-10-CM | POA: Diagnosis not present

## 2019-02-19 DIAGNOSIS — Z23 Encounter for immunization: Secondary | ICD-10-CM | POA: Diagnosis not present

## 2019-03-25 DIAGNOSIS — H35361 Drusen (degenerative) of macula, right eye: Secondary | ICD-10-CM | POA: Diagnosis not present

## 2019-03-25 DIAGNOSIS — H43821 Vitreomacular adhesion, right eye: Secondary | ICD-10-CM | POA: Diagnosis not present

## 2019-03-25 DIAGNOSIS — H33102 Unspecified retinoschisis, left eye: Secondary | ICD-10-CM | POA: Diagnosis not present

## 2019-03-25 DIAGNOSIS — Z9889 Other specified postprocedural states: Secondary | ICD-10-CM | POA: Diagnosis not present

## 2019-04-10 DIAGNOSIS — Z961 Presence of intraocular lens: Secondary | ICD-10-CM | POA: Diagnosis not present

## 2019-04-15 DIAGNOSIS — I10 Essential (primary) hypertension: Secondary | ICD-10-CM | POA: Diagnosis not present

## 2019-04-15 DIAGNOSIS — E785 Hyperlipidemia, unspecified: Secondary | ICD-10-CM | POA: Diagnosis not present

## 2019-04-15 DIAGNOSIS — Z0000 Encounter for general adult medical examination without abnormal findings: Secondary | ICD-10-CM | POA: Diagnosis not present

## 2019-04-15 DIAGNOSIS — R7303 Prediabetes: Secondary | ICD-10-CM | POA: Diagnosis not present

## 2019-04-15 DIAGNOSIS — M109 Gout, unspecified: Secondary | ICD-10-CM | POA: Diagnosis not present

## 2019-08-14 DIAGNOSIS — E1159 Type 2 diabetes mellitus with other circulatory complications: Secondary | ICD-10-CM | POA: Diagnosis not present

## 2019-08-14 DIAGNOSIS — E785 Hyperlipidemia, unspecified: Secondary | ICD-10-CM | POA: Diagnosis not present

## 2019-08-14 DIAGNOSIS — I10 Essential (primary) hypertension: Secondary | ICD-10-CM | POA: Diagnosis not present

## 2019-08-14 DIAGNOSIS — E118 Type 2 diabetes mellitus with unspecified complications: Secondary | ICD-10-CM | POA: Diagnosis not present

## 2019-09-23 ENCOUNTER — Ambulatory Visit (INDEPENDENT_AMBULATORY_CARE_PROVIDER_SITE_OTHER): Payer: Medicare Other | Admitting: Ophthalmology

## 2019-09-23 ENCOUNTER — Other Ambulatory Visit: Payer: Self-pay

## 2019-09-23 ENCOUNTER — Encounter (INDEPENDENT_AMBULATORY_CARE_PROVIDER_SITE_OTHER): Payer: Self-pay | Admitting: Ophthalmology

## 2019-09-23 DIAGNOSIS — H43821 Vitreomacular adhesion, right eye: Secondary | ICD-10-CM | POA: Diagnosis not present

## 2019-09-23 DIAGNOSIS — H33102 Unspecified retinoschisis, left eye: Secondary | ICD-10-CM | POA: Diagnosis not present

## 2019-09-23 DIAGNOSIS — H35372 Puckering of macula, left eye: Secondary | ICD-10-CM | POA: Diagnosis not present

## 2019-09-23 NOTE — Progress Notes (Signed)
09/23/2019     CHIEF COMPLAINT Patient presents for Retina Follow Up   HISTORY OF PRESENT ILLNESS: Louis Bryan is a 84 y.o. male who presents to the clinic today for:   HPI    Retina Follow Up    Patient presents with  Other.  In both eyes.  Duration of 6 months.  Since onset it is stable.          Comments    6 month follow up - OCT OU Patient denies change in vision and overall has no complaints.        Last edited by Berenice Bouton on 09/23/2019  8:37 AM. (History)      Referring physician: Edmon Crape, MD [address] [place],  [place] [address]  HISTORICAL INFORMATION:   Selected notes from the MEDICAL RECORD NUMBER    Lab Results  Component Value Date   HGBA1C 6.4 (H) 07/11/2017     CURRENT MEDICATIONS: No current outpatient medications on file. (Ophthalmic Drugs)   No current facility-administered medications for this visit. (Ophthalmic Drugs)   Current Outpatient Medications (Other)  Medication Sig  . allopurinol (ZYLOPRIM) 100 MG tablet Take 1 tablet (100 mg total) by mouth daily.  Marland Kitchen aspirin 81 MG tablet Take 81 mg by mouth daily.  Tery Sanfilippo Calcium (STOOL SOFTENER PO) Take by mouth Nightly.  Marland Kitchen losartan-hydrochlorothiazide (HYZAAR) 50-12.5 MG tablet Take 1 tablet by mouth daily.  . metoprolol succinate (TOPROL-XL) 25 MG 24 hr tablet Take 1 tablet (25 mg total) by mouth daily. with food  . ondansetron (ZOFRAN) 4 MG tablet Take 1 tablet (4 mg total) by mouth every 6 (six) hours.  . simvastatin (ZOCOR) 20 MG tablet Take 1 tablet (20 mg total) by mouth every evening.   No current facility-administered medications for this visit. (Other)      REVIEW OF SYSTEMS:    ALLERGIES No Active Allergies  PAST MEDICAL HISTORY Past Medical History:  Diagnosis Date  . Chronotropic incompetence - medication related 09/24/2012  . History of exercise stress test [phone]   negative bruce protocol excercise stress test with scintigraphic evidence of  diaphragmatic attenuation and mild apical thinning, dynamic gating was not performed secondary to frequent ectopy, low risk study  . Hyperlipidemia   . Hypertension   . Shortness of breath    On exertion  . Tobacco abuse    Past Surgical History:  Procedure Laterality Date  . CATARACT EXTRACTION Left 07/2016  . CHOLECYSTECTOMY N/A 10/27/2012   Procedure: LAPAROSCOPIC CHOLECYSTECTOMY WITH INTRAOPERATIVE CHOLANGIOGRAM;  Surgeon: Romie Levee, MD;  Location: [facility];  Service: General;  Laterality: N/A;    FAMILY HISTORY Family History  Problem Relation Age of Onset  . Diabetes Sister   . Diabetes Sister     SOCIAL HISTORY Social History   Tobacco Use  . Smoking status: Former Smoker    Quit date: 06/13/1992    Years since quitting: 27.2  . Smokeless tobacco: Never Used  Substance Use Topics  . Alcohol use: No  . Drug use: No         OPHTHALMIC EXAM: Base Eye Exam    Visual Acuity (Snellen - Linear)      Right Left   Dist [place] 20/40-2 20/60-2   Dist ph [place] NI NI       Tonometry (Tonopen, 8:42 AM)      Right Left   Pressure 13 12       Pupils      Pupils  Dark Light Shape React APD   Right PERRL 4 4 Irregular Minimal None   Left PERRL 4 4 Irregular Minimal None       Visual Fields (Counting fingers)      Left Right    Full Full       Extraocular Movement      Right Left    Full Full       Neuro/Psych    Oriented x3: Yes   Mood/Affect: Normal       Dilation    Both eyes: 1.0% Mydriacyl, 2.5% Phenylephrine @ 8:42 AM        Slit Lamp and Fundus Exam    External Exam      Right Left   External Normal Normal       Slit Lamp Exam      Right Left   Lids/Lashes Normal Normal   Conjunctiva/Sclera White and quiet White and quiet   Cornea Clear Clear   Anterior Chamber Deep and quiet Deep and quiet   Iris Round and reactive Round and reactive   Lens Posterior chamber intraocular lens Posterior chamber intraocular lens   Vitreous Normal Normal            IMAGING AND PROCEDURES  Imaging and Procedures for 09/23/19  OCT, Retina - OU - Both Eyes       Right Eye Quality was good. Scan locations included subfoveal.   Left Eye Quality was good. Scan locations included subfoveal.                 ASSESSMENT/PLAN:  No problem-specific Assessment & Plan notes found for this encounter.    No diagnosis found.  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  Has outer retinal vitreal retinal debris likely secondary to the vitreal macular traction.  2.OS as epiretinal membrane with inner retinal foveal macular schisis largely unchanged but still accounts for the vision.  3.  Ophthalmic Meds Ordered this visit:  No orders of the defined types were placed in this encounter.      No follow-ups on file.  There are no Patient Instructions on file for this visit.   Explained the diagnoses, plan, and follow up with the patient and they expressed understanding.  Patient expressed understanding of the importance of proper follow up care.   Clent Demark Conchetta Lamia M.D. Diseases & Surgery of the Retina and Vitreous [facility] 09/23/19     Abbreviations: M myopia (nearsighted); A astigmatism; H hyperopia (farsighted); P presbyopia; Mrx spectacle prescription;  CTL contact lenses; OD right eye; OS left eye; OU both eyes  XT exotropia; ET esotropia; PEK punctate epithelial keratitis; PEE punctate epithelial erosions; DES dry eye syndrome; MGD meibomian gland dysfunction; ATs artificial tears; PFAT's preservative free artificial tears; [place] nuclear sclerotic cataract; PSC posterior subcapsular cataract; ERM epi-retinal membrane; PVD posterior vitreous detachment; RD retinal detachment; DM diabetes mellitus; DR diabetic retinopathy; NPDR non-proliferative diabetic retinopathy; PDR proliferative diabetic retinopathy; CSME clinically significant macular edema; DME diabetic macular edema;  dbh dot blot hemorrhages; CWS cotton wool spot; POAG primary open angle glaucoma; C/D cup-to-disc ratio; HVF humphrey visual field; GVF goldmann visual field; OCT optical coherence tomography; IOP intraocular pressure; BRVO Branch retinal vein occlusion; CRVO central retinal vein occlusion; CRAO central retinal artery occlusion; BRAO branch retinal artery occlusion; RT retinal tear; SB scleral buckle; PPV pars plana vitrectomy; VH Vitreous hemorrhage; PRP panretinal laser photocoagulation; IVK intravitreal kenalog; VMT vitreomacular traction; MH  Macular hole;  NVD neovascularization of the disc; NVE neovascularization elsewhere; AREDS age related eye disease study; ARMD age related macular degeneration; POAG primary open angle glaucoma; EBMD epithelial/anterior basement membrane dystrophy; ACIOL anterior chamber intraocular lens; IOL intraocular lens; PCIOL posterior chamber intraocular lens; Phaco/IOL phacoemulsification with intraocular lens placement; [place] photorefractive keratectomy; LASIK laser assisted in situ keratomileusis; HTN hypertension; DM diabetes mellitus; COPD chronic obstructive pulmonary disease

## 2019-11-13 DIAGNOSIS — E1159 Type 2 diabetes mellitus with other circulatory complications: Secondary | ICD-10-CM | POA: Diagnosis not present

## 2019-11-13 DIAGNOSIS — I152 Hypertension secondary to endocrine disorders: Secondary | ICD-10-CM | POA: Diagnosis not present

## 2019-11-13 DIAGNOSIS — I10 Essential (primary) hypertension: Secondary | ICD-10-CM | POA: Diagnosis not present

## 2019-11-13 DIAGNOSIS — E1169 Type 2 diabetes mellitus with other specified complication: Secondary | ICD-10-CM | POA: Diagnosis not present

## 2019-11-13 DIAGNOSIS — E785 Hyperlipidemia, unspecified: Secondary | ICD-10-CM | POA: Diagnosis not present

## 2019-11-13 DIAGNOSIS — E118 Type 2 diabetes mellitus with unspecified complications: Secondary | ICD-10-CM | POA: Diagnosis not present

## 2020-02-13 DIAGNOSIS — E118 Type 2 diabetes mellitus with unspecified complications: Secondary | ICD-10-CM | POA: Diagnosis not present

## 2020-02-13 DIAGNOSIS — E1169 Type 2 diabetes mellitus with other specified complication: Secondary | ICD-10-CM | POA: Diagnosis not present

## 2020-02-13 DIAGNOSIS — R350 Frequency of micturition: Secondary | ICD-10-CM | POA: Diagnosis not present

## 2020-02-13 DIAGNOSIS — Z23 Encounter for immunization: Secondary | ICD-10-CM | POA: Diagnosis not present

## 2020-02-13 DIAGNOSIS — E1159 Type 2 diabetes mellitus with other circulatory complications: Secondary | ICD-10-CM | POA: Diagnosis not present

## 2020-02-13 DIAGNOSIS — I10 Essential (primary) hypertension: Secondary | ICD-10-CM | POA: Diagnosis not present

## 2020-02-13 DIAGNOSIS — E785 Hyperlipidemia, unspecified: Secondary | ICD-10-CM | POA: Diagnosis not present

## 2020-03-25 ENCOUNTER — Other Ambulatory Visit: Payer: Self-pay

## 2020-03-25 ENCOUNTER — Ambulatory Visit (INDEPENDENT_AMBULATORY_CARE_PROVIDER_SITE_OTHER): Payer: Medicare Other | Admitting: Ophthalmology

## 2020-03-25 ENCOUNTER — Encounter (INDEPENDENT_AMBULATORY_CARE_PROVIDER_SITE_OTHER): Payer: Self-pay | Admitting: Ophthalmology

## 2020-03-25 DIAGNOSIS — H43821 Vitreomacular adhesion, right eye: Secondary | ICD-10-CM

## 2020-03-25 DIAGNOSIS — Z9889 Other specified postprocedural states: Secondary | ICD-10-CM

## 2020-03-25 DIAGNOSIS — H35372 Puckering of macula, left eye: Secondary | ICD-10-CM

## 2020-03-25 NOTE — Assessment & Plan Note (Signed)
Stable residual foveal macular schisis not CME with stable acuity overall improved

## 2020-03-25 NOTE — Assessment & Plan Note (Addendum)
Vitreous traction on the inner retina with outer retinal changes  OD with outer retinal drusenoid deposit in the subfoveal location likely secondary to vitreous traction on the inner retina, this is stable over the last year and a half but will monitor monitor to look for signs of photoreceptor layer damage

## 2020-03-25 NOTE — Progress Notes (Signed)
03/25/2020     CHIEF COMPLAINT Patient presents for Retina Follow Up   HISTORY OF PRESENT ILLNESS: Louis Bryan is a 84 y.o. male who presents to the clinic today for:   HPI    Retina Follow Up    Patient presents with  Other.  In both eyes.  This started 6 months ago.  Severity is mild.  Duration of 6 months.  Since onset it is stable.          Comments    6 Month F/U OU  Pt denies noticeable changes to [place] OU since last visit. Pt denies ocular pain, flashes of light, or floaters OU.         Last edited by Ileana Roup, COA on 03/25/2020  8:15 AM. (History)      Referring physician: No referring provider defined for this encounter.  HISTORICAL INFORMATION:   Selected notes from the MEDICAL RECORD NUMBER    Lab Results  Component Value Date   HGBA1C 6.4 (H) 07/11/2017     CURRENT MEDICATIONS: No current outpatient medications on file. (Ophthalmic Drugs)   No current facility-administered medications for this visit. (Ophthalmic Drugs)   Current Outpatient Medications (Other)  Medication Sig  . allopurinol (ZYLOPRIM) 100 MG tablet Take 1 tablet (100 mg total) by mouth daily.  Marland Kitchen aspirin 81 MG tablet Take 81 mg by mouth daily.  Tery Sanfilippo Calcium (STOOL SOFTENER PO) Take by mouth Nightly.  Marland Kitchen losartan-hydrochlorothiazide (HYZAAR) 50-12.5 MG tablet Take 1 tablet by mouth daily.  . metoprolol succinate (TOPROL-XL) 25 MG 24 hr tablet Take 1 tablet (25 mg total) by mouth daily. with food  . ondansetron (ZOFRAN) 4 MG tablet Take 1 tablet (4 mg total) by mouth every 6 (six) hours.  . simvastatin (ZOCOR) 20 MG tablet Take 1 tablet (20 mg total) by mouth every evening.   No current facility-administered medications for this visit. (Other)      REVIEW OF SYSTEMS:    ALLERGIES No Active Allergies  PAST MEDICAL HISTORY Past Medical History:  Diagnosis Date  . Chronotropic incompetence - medication related 09/24/2012  . History of exercise stress test [phone]    negative bruce protocol excercise stress test with scintigraphic evidence of diaphragmatic attenuation and mild apical thinning, dynamic gating was not performed secondary to frequent ectopy, low risk study  . Hyperlipidemia   . Hypertension   . Shortness of breath    On exertion  . Tobacco abuse    Past Surgical History:  Procedure Laterality Date  . CATARACT EXTRACTION Left 07/2016  . CHOLECYSTECTOMY N/A 10/27/2012   Procedure: LAPAROSCOPIC CHOLECYSTECTOMY WITH INTRAOPERATIVE CHOLANGIOGRAM;  Surgeon: Romie Levee, MD;  Location: [facility];  Service: General;  Laterality: N/A;    FAMILY HISTORY Family History  Problem Relation Age of Onset  . Diabetes Sister   . Diabetes Sister     SOCIAL HISTORY Social History   Tobacco Use  . Smoking status: Former Smoker    Quit date: 06/13/1992    Years since quitting: 27.8  . Smokeless tobacco: Never Used  Vaping Use  . Vaping Use: Never used  Substance Use Topics  . Alcohol use: No  . Drug use: No         OPHTHALMIC EXAM:  Base Eye Exam    Visual Acuity (ETDRS)      Right Left   Dist [place] 20/50 +1 20/70 +1   Dist ph [place] NI 20/70 +2       Tonometry (Tonopen,  8:14 AM)      Right Left   Pressure 14 13       Pupils      Pupils Dark Light Shape React APD   Right PERRL 5 5 Irregular Minimal None   Left PERRL 5 5 Irregular Minimal None       Visual Fields (Counting fingers)      Left Right    Full Full       Extraocular Movement      Right Left    Full Full       Neuro/Psych    Oriented x3: Yes   Mood/Affect: Normal       Dilation    Both eyes: 1.0% Mydriacyl, 2.5% Phenylephrine @ 8:18 AM        Slit Lamp and Fundus Exam    External Exam      Right Left   External Normal Normal       Slit Lamp Exam      Right Left   Lids/Lashes Normal Normal   Conjunctiva/Sclera White and quiet White and quiet   Cornea Clear Clear   Anterior Chamber Deep and quiet Deep and quiet   Iris Round and reactive Round and  reactive   Lens Posterior chamber intraocular lens Posterior chamber intraocular lens   Anterior Vitreous Normal Normal       Fundus Exam      Right Left   Posterior Vitreous Partial posterior vitreous detachment,  Vitrectomized   Disc Normal Normal   C/D Ratio 0.65 0.6   Macula Subtle color change to the RPE in the foveal region. Pseudocystoid change in the fovea, Retinal pigment epithelial mottling, no hemorrhage, no macular thickening   Vessels Normal Normal   Periphery Normal Normal          IMAGING AND PROCEDURES  Imaging and Procedures for 03/25/20  OCT, Retina - OU - Both Eyes       Right Eye Quality was good. Scan locations included subfoveal. Central Foveal Thickness: 288. Progression has been stable. Findings include abnormal foveal contour, vitreous traction, vitreomacular adhesion , retinal drusen .   Left Eye Quality was good. Scan locations included subfoveal. Central Foveal Thickness: 420. Progression has improved. Findings include abnormal foveal contour.   Notes OD with outer retinal drusenoid deposit in the subfoveal location likely secondary to vitreous traction on the inner retina, this is stable over the last year and a half but will monitor monitor to look for signs of photoreceptor layer damage  OS, massive improvement status post vitrectomy for vitreal macular traction detachment of the left eye with severe retinal schisis and serous retinal detachment, preoperative visual acuity of 20/400 has now improved to 20/70                  ASSESSMENT/PLAN:  Vitreomacular adhesion of right eye Vitreous traction on the inner retina with outer retinal changes  OD with outer retinal drusenoid deposit in the subfoveal location likely secondary to vitreous traction on the inner retina, this is stable over the last year and a half but will monitor monitor to look for signs of photoreceptor layer damage    History of vitrectomy Stable residual foveal  macular schisis not CME with stable acuity overall improved      ICD-10-CM   1. Vitreomacular adhesion of right eye  H43.821 OCT, Retina - OU - Both Eyes  2. Macular pucker, left eye  H35.372 OCT, Retina - OU - Both Eyes  3.  History of vitrectomy  Z98.890     1.  OD with vitreal macular traction and still with outer retinal drusenoid deposit which may be impacting the acuity.  We will follow this closely with OCT follow-up in 4 to 5 months if outer retinal changes persist or worsen, may need vitrectomy in the right eye to prevent the occurrence #2 what happened to the left eye with permanent acuity changes  2.  3.  Ophthalmic Meds Ordered this visit:  No orders of the defined types were placed in this encounter.      Return in about 5 months (around 08/23/2020) for DILATE OU, OCT.  There are no Patient Instructions on file for this visit.   Explained the diagnoses, plan, and follow up with the patient and they expressed understanding.  Patient expressed understanding of the importance of proper follow up care.   Alford Highland Danasia Baker M.D. Diseases & Surgery of the Retina and Vitreous [facility] 03/25/20     Abbreviations: M myopia (nearsighted); A astigmatism; H hyperopia (farsighted); P presbyopia; Mrx spectacle prescription;  CTL contact lenses; OD right eye; OS left eye; OU both eyes  XT exotropia; ET esotropia; PEK punctate epithelial keratitis; PEE punctate epithelial erosions; DES dry eye syndrome; MGD meibomian gland dysfunction; ATs artificial tears; PFAT's preservative free artificial tears; NSC nuclear sclerotic cataract; PSC posterior subcapsular cataract; ERM epi-retinal membrane; PVD posterior vitreous detachment; RD retinal detachment; DM diabetes mellitus; DR diabetic retinopathy; NPDR non-proliferative diabetic retinopathy; PDR proliferative diabetic retinopathy; CSME clinically significant macular edema; DME diabetic macular edema; dbh dot blot  hemorrhages; CWS cotton wool spot; POAG primary open angle glaucoma; C/D cup-to-disc ratio; HVF humphrey visual field; GVF goldmann visual field; OCT optical coherence tomography; IOP intraocular pressure; BRVO Branch retinal vein occlusion; CRVO central retinal vein occlusion; CRAO central retinal artery occlusion; BRAO branch retinal artery occlusion; RT retinal tear; SB scleral buckle; PPV pars plana vitrectomy; VH Vitreous hemorrhage; PRP panretinal laser photocoagulation; IVK intravitreal kenalog; VMT vitreomacular traction; MH Macular hole;  NVD neovascularization of the disc; NVE neovascularization elsewhere; AREDS age related eye disease study; ARMD age related macular degeneration; POAG primary open angle glaucoma; EBMD epithelial/anterior basement membrane dystrophy; ACIOL anterior chamber intraocular lens; IOL intraocular lens; PCIOL posterior chamber intraocular lens; Phaco/IOL phacoemulsification with intraocular lens placement; PRK photorefractive keratectomy; LASIK laser assisted in situ keratomileusis; HTN hypertension; DM diabetes mellitus; COPD chronic obstructive pulmonary disease

## 2020-04-14 DIAGNOSIS — H35352 Cystoid macular degeneration, left eye: Secondary | ICD-10-CM | POA: Diagnosis not present

## 2020-04-14 DIAGNOSIS — H43821 Vitreomacular adhesion, right eye: Secondary | ICD-10-CM | POA: Diagnosis not present

## 2020-04-14 DIAGNOSIS — H40023 Open angle with borderline findings, high risk, bilateral: Secondary | ICD-10-CM | POA: Diagnosis not present

## 2020-04-14 DIAGNOSIS — Z9889 Other specified postprocedural states: Secondary | ICD-10-CM | POA: Diagnosis not present

## 2020-04-14 DIAGNOSIS — H5703 Miosis: Secondary | ICD-10-CM | POA: Diagnosis not present

## 2020-05-20 DIAGNOSIS — E1159 Type 2 diabetes mellitus with other circulatory complications: Secondary | ICD-10-CM | POA: Diagnosis not present

## 2020-05-20 DIAGNOSIS — E1169 Type 2 diabetes mellitus with other specified complication: Secondary | ICD-10-CM | POA: Diagnosis not present

## 2020-05-20 DIAGNOSIS — E118 Type 2 diabetes mellitus with unspecified complications: Secondary | ICD-10-CM | POA: Diagnosis not present

## 2020-05-20 DIAGNOSIS — H9193 Unspecified hearing loss, bilateral: Secondary | ICD-10-CM | POA: Diagnosis not present

## 2020-05-20 DIAGNOSIS — I152 Hypertension secondary to endocrine disorders: Secondary | ICD-10-CM | POA: Diagnosis not present

## 2020-05-20 DIAGNOSIS — Z0000 Encounter for general adult medical examination without abnormal findings: Secondary | ICD-10-CM | POA: Diagnosis not present

## 2020-05-20 DIAGNOSIS — E785 Hyperlipidemia, unspecified: Secondary | ICD-10-CM | POA: Diagnosis not present

## 2020-07-07 DIAGNOSIS — H903 Sensorineural hearing loss, bilateral: Secondary | ICD-10-CM | POA: Diagnosis not present

## 2020-07-24 DIAGNOSIS — H1132 Conjunctival hemorrhage, left eye: Secondary | ICD-10-CM | POA: Diagnosis not present

## 2020-08-19 DIAGNOSIS — M109 Gout, unspecified: Secondary | ICD-10-CM | POA: Diagnosis not present

## 2020-08-19 DIAGNOSIS — E1159 Type 2 diabetes mellitus with other circulatory complications: Secondary | ICD-10-CM | POA: Diagnosis not present

## 2020-08-19 DIAGNOSIS — I4589 Other specified conduction disorders: Secondary | ICD-10-CM | POA: Diagnosis not present

## 2020-08-19 DIAGNOSIS — E1169 Type 2 diabetes mellitus with other specified complication: Secondary | ICD-10-CM | POA: Diagnosis not present

## 2020-08-19 DIAGNOSIS — R059 Cough, unspecified: Secondary | ICD-10-CM | POA: Diagnosis not present

## 2020-08-19 DIAGNOSIS — I152 Hypertension secondary to endocrine disorders: Secondary | ICD-10-CM | POA: Diagnosis not present

## 2020-08-19 DIAGNOSIS — Z87891 Personal history of nicotine dependence: Secondary | ICD-10-CM | POA: Diagnosis not present

## 2020-08-19 DIAGNOSIS — I10 Essential (primary) hypertension: Secondary | ICD-10-CM | POA: Diagnosis not present

## 2020-08-19 DIAGNOSIS — E118 Type 2 diabetes mellitus with unspecified complications: Secondary | ICD-10-CM | POA: Diagnosis not present

## 2020-08-19 DIAGNOSIS — E785 Hyperlipidemia, unspecified: Secondary | ICD-10-CM | POA: Diagnosis not present

## 2020-08-21 DIAGNOSIS — Z87891 Personal history of nicotine dependence: Secondary | ICD-10-CM | POA: Diagnosis not present

## 2020-08-21 DIAGNOSIS — R053 Chronic cough: Secondary | ICD-10-CM | POA: Diagnosis not present

## 2020-08-21 DIAGNOSIS — R0602 Shortness of breath: Secondary | ICD-10-CM | POA: Diagnosis not present

## 2020-08-21 DIAGNOSIS — R634 Abnormal weight loss: Secondary | ICD-10-CM | POA: Diagnosis not present

## 2020-08-24 ENCOUNTER — Ambulatory Visit (INDEPENDENT_AMBULATORY_CARE_PROVIDER_SITE_OTHER): Payer: Medicare Other | Admitting: Ophthalmology

## 2020-08-24 ENCOUNTER — Other Ambulatory Visit: Payer: Self-pay

## 2020-08-24 ENCOUNTER — Encounter (INDEPENDENT_AMBULATORY_CARE_PROVIDER_SITE_OTHER): Payer: Self-pay | Admitting: Ophthalmology

## 2020-08-24 DIAGNOSIS — H43821 Vitreomacular adhesion, right eye: Secondary | ICD-10-CM

## 2020-08-24 DIAGNOSIS — H33102 Unspecified retinoschisis, left eye: Secondary | ICD-10-CM | POA: Diagnosis not present

## 2020-08-24 DIAGNOSIS — Z9889 Other specified postprocedural states: Secondary | ICD-10-CM

## 2020-08-24 DIAGNOSIS — H35372 Puckering of macula, left eye: Secondary | ICD-10-CM | POA: Diagnosis not present

## 2020-08-24 NOTE — Progress Notes (Signed)
08/24/2020     CHIEF COMPLAINT Patient presents for Retina Follow Up (5 Mo F/U OU//Pt denies noticeable changes to [place] OU since last visit. Pt denies ocular pain, flashes of light, or floaters OU. //)   HISTORY OF PRESENT ILLNESS: Louis Bryan is a 85 y.o. male who presents to the clinic today for:   HPI    Retina Follow Up    Patient presents with  Other.  In both eyes.  This started 5 months ago.  Severity is mild.  Duration of 5 months.  Since onset it is stable. Additional comments: 5 Mo F/U OU  Pt denies noticeable changes to [place] OU since last visit. Pt denies ocular pain, flashes of light, or floaters OU.          Last edited by Ileana Roup, COA on 08/24/2020  8:16 AM. (History)      Referring physician: No referring provider defined for this encounter.  HISTORICAL INFORMATION:   Selected notes from the MEDICAL RECORD NUMBER    Lab Results  Component Value Date   HGBA1C 6.4 (H) 07/11/2017     CURRENT MEDICATIONS: No current outpatient medications on file. (Ophthalmic Drugs)   No current facility-administered medications for this visit. (Ophthalmic Drugs)   Current Outpatient Medications (Other)  Medication Sig  . allopurinol (ZYLOPRIM) 100 MG tablet Take 1 tablet (100 mg total) by mouth daily.  Marland Kitchen aspirin 81 MG tablet Take 81 mg by mouth daily.  Tery Sanfilippo Calcium (STOOL SOFTENER PO) Take by mouth Nightly.  Marland Kitchen losartan-hydrochlorothiazide (HYZAAR) 50-12.5 MG tablet Take 1 tablet by mouth daily.  . metoprolol succinate (TOPROL-XL) 25 MG 24 hr tablet Take 1 tablet (25 mg total) by mouth daily. with food  . ondansetron (ZOFRAN) 4 MG tablet Take 1 tablet (4 mg total) by mouth every 6 (six) hours.  . simvastatin (ZOCOR) 20 MG tablet Take 1 tablet (20 mg total) by mouth every evening.   No current facility-administered medications for this visit. (Other)      REVIEW OF SYSTEMS:    ALLERGIES No Known Allergies  PAST MEDICAL HISTORY Past Medical History:   Diagnosis Date  . Chronotropic incompetence - medication related 09/24/2012  . History of exercise stress test [phone]   negative bruce protocol excercise stress test with scintigraphic evidence of diaphragmatic attenuation and mild apical thinning, dynamic gating was not performed secondary to frequent ectopy, low risk study  . Hyperlipidemia   . Hypertension   . Shortness of breath    On exertion  . Tobacco abuse    Past Surgical History:  Procedure Laterality Date  . CATARACT EXTRACTION Left 07/2016  . CHOLECYSTECTOMY N/A 10/27/2012   Procedure: LAPAROSCOPIC CHOLECYSTECTOMY WITH INTRAOPERATIVE CHOLANGIOGRAM;  Surgeon: Romie Levee, MD;  Location: [facility];  Service: General;  Laterality: N/A;    FAMILY HISTORY Family History  Problem Relation Age of Onset  . Diabetes Sister   . Diabetes Sister     SOCIAL HISTORY Social History   Tobacco Use  . Smoking status: Former Smoker    Quit date: 06/13/1992    Years since quitting: 28.2  . Smokeless tobacco: Never Used  Vaping Use  . Vaping Use: Never used  Substance Use Topics  . Alcohol use: No  . Drug use: No         OPHTHALMIC EXAM: Base Eye Exam    Visual Acuity (ETDRS)      Right Left   Dist [place] 20/40 +1 20/70 +2   Dist  ph [place] NI NI       Tonometry (Tonopen, 8:20 AM)      Right Left   Pressure 12 13       Pupils      Pupils Dark Light Shape React APD   Right PERRL 5 5 Irregular Minimal None   Left PERRL 5 5 Irregular Minimal None       Visual Fields (Counting fingers)      Left Right    Full Full       Extraocular Movement      Right Left    Full Full       Neuro/Psych    Oriented x3: Yes   Mood/Affect: Normal       Dilation    Both eyes: 1.0% Mydriacyl, 2.5% Phenylephrine @ 8:20 AM        Slit Lamp and Fundus Exam    External Exam      Right Left   External Normal Normal       Slit Lamp Exam      Right Left   Lids/Lashes Normal Normal   Conjunctiva/Sclera White and quiet White and  quiet   Cornea Clear Clear   Anterior Chamber Deep and quiet Deep and quiet   Iris Round and reactive Round and reactive   Lens Posterior chamber intraocular lens Posterior chamber intraocular lens   Anterior Vitreous Normal Normal       Fundus Exam      Right Left   Posterior Vitreous Partial posterior vitreous detachment,  Vitrectomized   Disc Normal Normal   C/D Ratio 0.65 0.6   Macula Subtle color change to the RPE in the foveal region.  Negative Watzke. Pseudocystoid change in the fovea, Retinal pigment epithelial mottling, no hemorrhage, no macular thickening   Vessels Normal Normal   Periphery Normal Normal          IMAGING AND PROCEDURES  Imaging and Procedures for 08/24/20  OCT, Retina - OU - Both Eyes       Right Eye Quality was good. Scan locations included subfoveal. Central Foveal Thickness: 279. Progression has been stable. Findings include abnormal foveal contour, vitreous traction, vitreomacular adhesion , retinal drusen .   Left Eye Quality was good. Scan locations included subfoveal. Central Foveal Thickness: 410. Progression has improved. Findings include abnormal foveal contour.   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 Vitreomacular adhesion of right eye  H43.821 OCT, Retina - OU - Both Eyes  2. Macular pucker, left eye  H35.372 OCT, Retina - OU - Both Eyes  3. History  of vitrectomy  Z98.890   4. Macular retinoschisis, left  H33.102     1.  Overall visual acuity right eye is stable, mild to moderate vitreomacular adhesion with intraretinal distortion will need to be monitored if visual acuity declines may need surgical intervention.  Small focal attachment is likely over time to resolve spontaneously into a PVD.  2.  OS, foveal macular schisis stable, and vastly improved since resolution and resection of vitreomacular retraction syndrome February 2018 3.  Ophthalmic Meds Ordered this visit:  No orders of the defined types were placed in this encounter.      Return in about 6 months (around 02/23/2021) for DILATE OU, OCT.  There are no Patient Instructions on file for this visit.   Explained the diagnoses, plan, and follow up with the patient and they expressed understanding.  Patient expressed understanding of the importance of proper follow up care.   Alford Highland Tatsuya Okray M.D. Diseases & Surgery of the Retina and Vitreous [facility] 08/24/20     Abbreviations: M myopia (nearsighted); A astigmatism; H hyperopia (farsighted); P presbyopia; Mrx spectacle prescription;  CTL contact lenses; OD right eye; OS left eye; OU both eyes  XT exotropia; ET esotropia; PEK punctate epithelial keratitis; PEE punctate epithelial erosions; DES dry eye syndrome; MGD meibomian gland dysfunction; ATs artificial tears; PFAT's preservative free artificial tears; NSC nuclear sclerotic cataract; PSC posterior subcapsular cataract; ERM epi-retinal membrane; PVD posterior vitreous detachment; RD retinal detachment; DM diabetes mellitus; DR diabetic  retinopathy; NPDR non-proliferative diabetic retinopathy; PDR proliferative diabetic retinopathy; CSME clinically significant macular edema; DME diabetic macular edema; dbh dot blot hemorrhages; CWS cotton wool spot; POAG primary open angle glaucoma; C/D cup-to-disc ratio; HVF humphrey visual field; GVF goldmann visual field; OCT optical coherence tomography; IOP intraocular pressure; BRVO Branch retinal vein occlusion; CRVO central retinal vein occlusion; CRAO central retinal artery occlusion; BRAO branch retinal artery occlusion; RT retinal tear; SB scleral buckle; PPV pars plana vitrectomy; VH Vitreous hemorrhage; PRP panretinal laser photocoagulation; IVK intravitreal kenalog; VMT vitreomacular traction; MH Macular hole;  NVD neovascularization of the disc; NVE neovascularization elsewhere; AREDS age related eye disease study; ARMD age related macular degeneration; POAG primary open angle glaucoma; EBMD epithelial/anterior basement membrane dystrophy; ACIOL anterior chamber intraocular lens; IOL intraocular lens; PCIOL posterior chamber intraocular lens; Phaco/IOL phacoemulsification with intraocular lens placement; PRK photorefractive keratectomy; LASIK laser assisted in situ keratomileusis; HTN hypertension; DM diabetes mellitus; COPD chronic obstructive pulmonary disease

## 2020-08-24 NOTE — Assessment & Plan Note (Signed)
Minor outer retinal changes yet with good acuity we will continue to monitor and observe

## 2020-08-24 NOTE — Assessment & Plan Note (Signed)
Vastly improved overall postvitrectomy for severe vitreomacular traction syndrome, stable now for 4 years

## 2020-10-06 DIAGNOSIS — I8290 Acute embolism and thrombosis of unspecified vein: Secondary | ICD-10-CM | POA: Insufficient documentation

## 2020-10-13 IMAGING — DX DG ABDOMEN 2V
2 series · 2 of 2 positions shown · non-contrast
Comparison: Prior acute abdominal series 10/27/2012

CLINICAL DATA: 83-year-old male with right lower quadrant abdominal
pain

EXAM:
ABDOMEN - 2 VIEW

[abdomen erect]
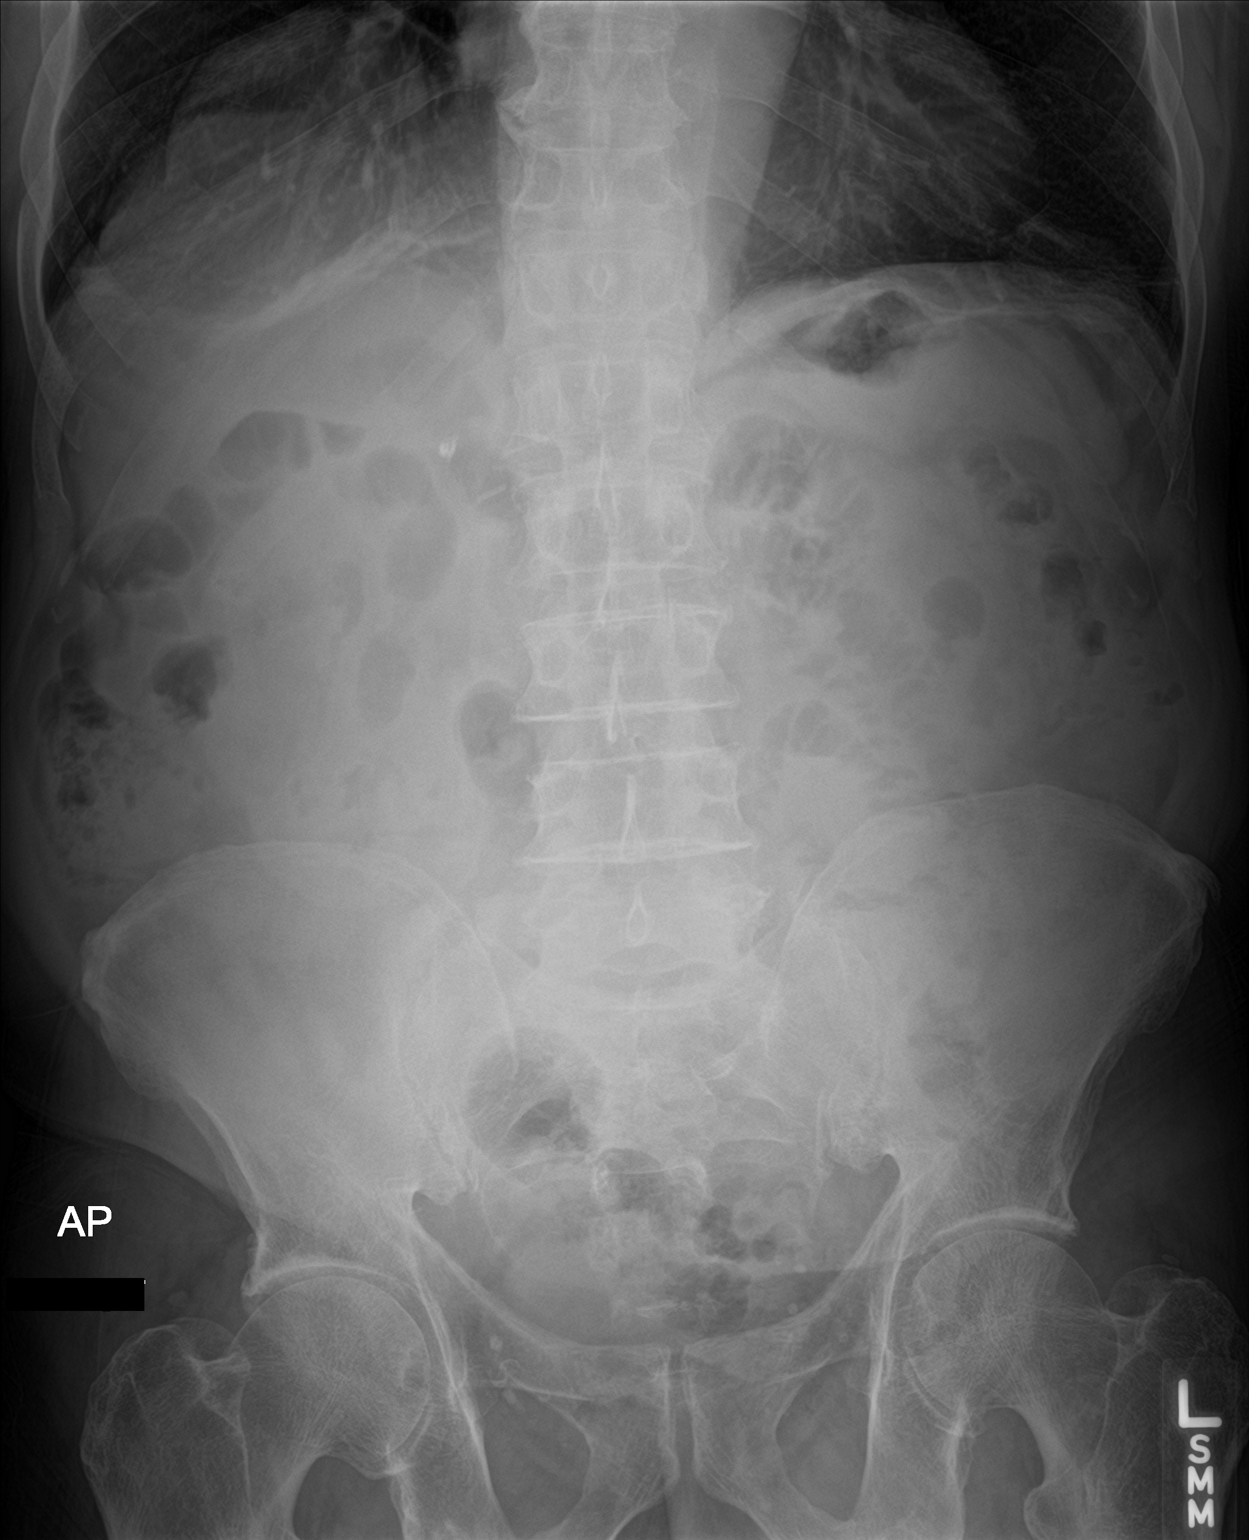

[abdomen supine]
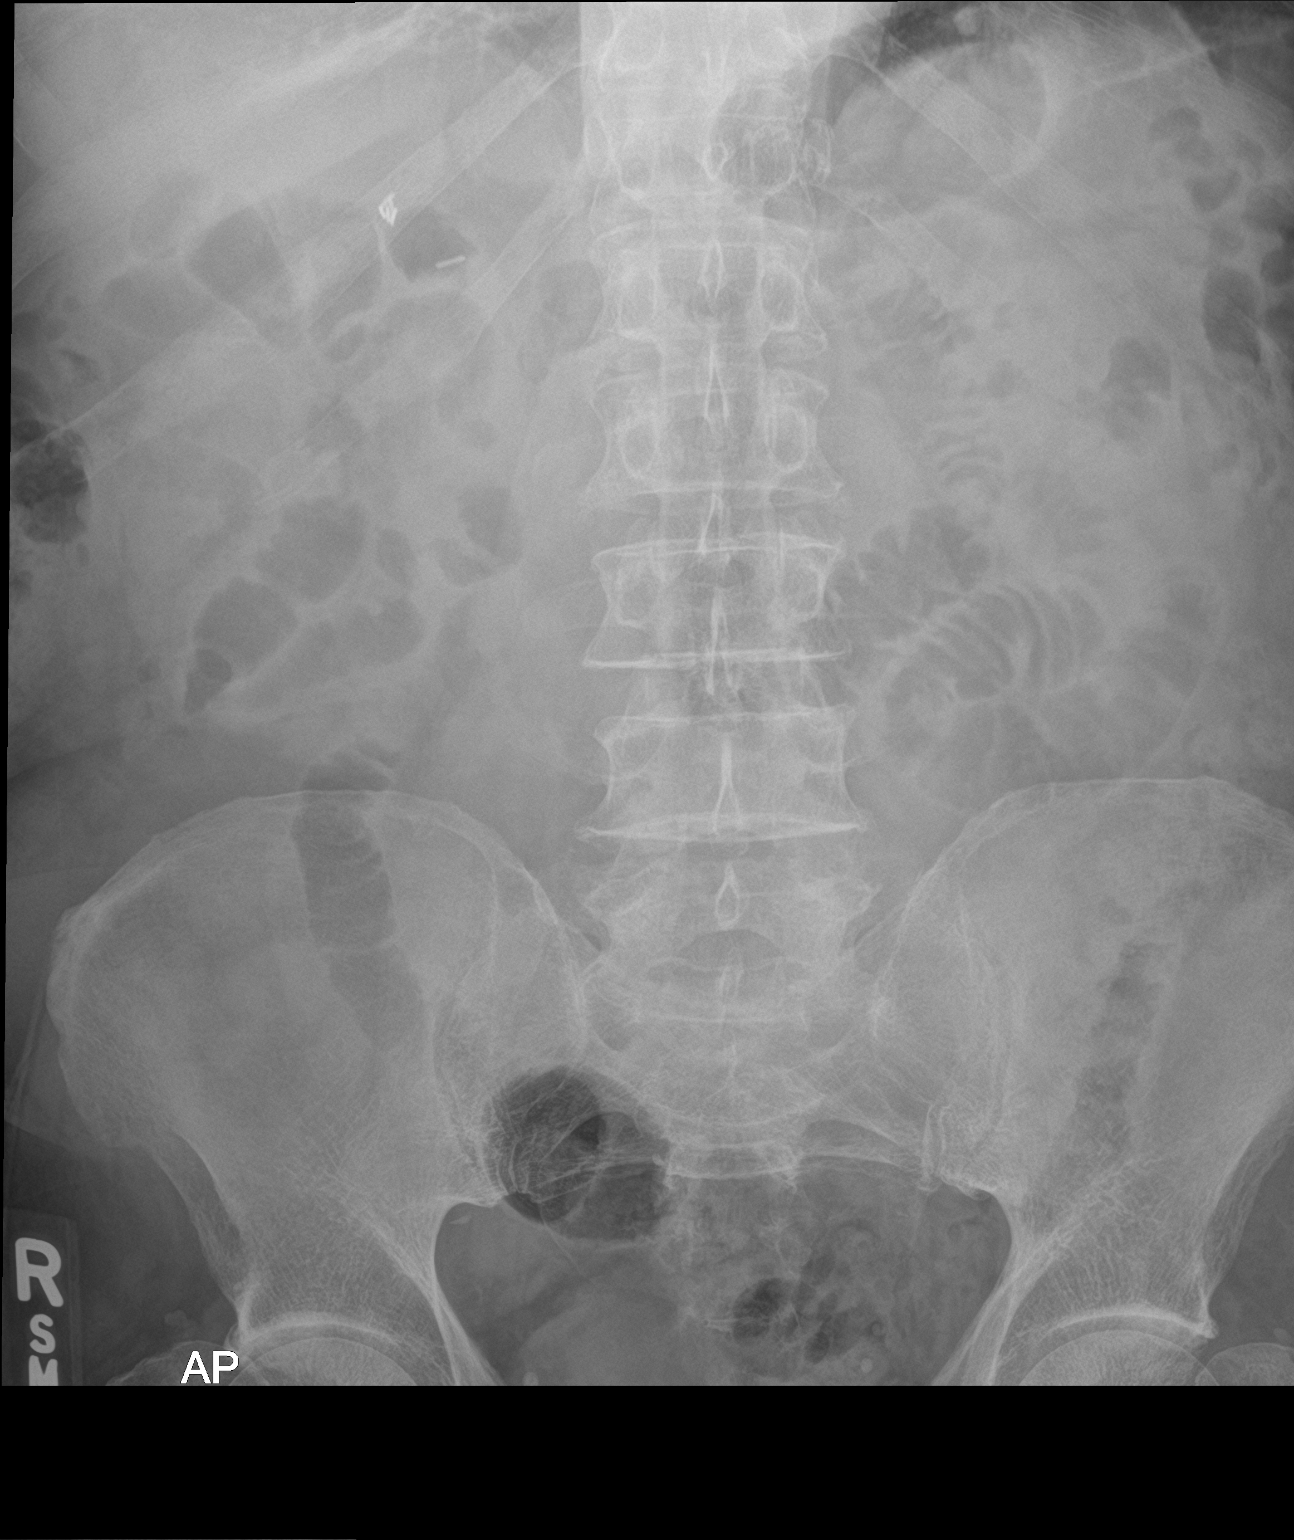

[2 of 2 positions shown; findings below may reference images not displayed]

FINDINGS: Surgical clips in the right upper quadrant suggest prior
cholecystectomy. No evidence of free air. Linear densities overlie
the lung bases bilaterally consistent with atelectasis. Gas is
present within multiple loops of small bowel in the left mid
abdomen. There appears to be mild thickening of the valvulae
conniventes consistent with submucosal edema. No acute osseous
abnormality.
IMPRESSION: Mild thickening of the valvulae conniventes in a few loops of small
bowel in the left mid abdomen concerning for possible
gastroenteritis.

## 2020-11-20 DIAGNOSIS — E785 Hyperlipidemia, unspecified: Secondary | ICD-10-CM | POA: Diagnosis not present

## 2020-11-20 DIAGNOSIS — E1169 Type 2 diabetes mellitus with other specified complication: Secondary | ICD-10-CM | POA: Diagnosis not present

## 2020-11-20 DIAGNOSIS — I10 Essential (primary) hypertension: Secondary | ICD-10-CM | POA: Diagnosis not present

## 2020-11-20 DIAGNOSIS — I152 Hypertension secondary to endocrine disorders: Secondary | ICD-10-CM | POA: Diagnosis not present

## 2020-11-20 DIAGNOSIS — Z23 Encounter for immunization: Secondary | ICD-10-CM | POA: Diagnosis not present

## 2020-11-20 DIAGNOSIS — E1159 Type 2 diabetes mellitus with other circulatory complications: Secondary | ICD-10-CM | POA: Diagnosis not present

## 2020-11-20 DIAGNOSIS — E118 Type 2 diabetes mellitus with unspecified complications: Secondary | ICD-10-CM | POA: Diagnosis not present

## 2021-03-01 ENCOUNTER — Encounter (INDEPENDENT_AMBULATORY_CARE_PROVIDER_SITE_OTHER): Payer: Self-pay | Admitting: Ophthalmology

## 2021-03-01 ENCOUNTER — Other Ambulatory Visit: Payer: Self-pay

## 2021-03-01 ENCOUNTER — Ambulatory Visit (INDEPENDENT_AMBULATORY_CARE_PROVIDER_SITE_OTHER): Payer: Medicare Other | Admitting: Ophthalmology

## 2021-03-01 DIAGNOSIS — H35372 Puckering of macula, left eye: Secondary | ICD-10-CM

## 2021-03-01 DIAGNOSIS — Z9889 Other specified postprocedural states: Secondary | ICD-10-CM

## 2021-03-01 DIAGNOSIS — H40002 Preglaucoma, unspecified, left eye: Secondary | ICD-10-CM

## 2021-03-01 DIAGNOSIS — H43821 Vitreomacular adhesion, right eye: Secondary | ICD-10-CM

## 2021-03-01 NOTE — Assessment & Plan Note (Signed)
Vastly improved macular anatomy left eye although the severe retinoschisis present in the macula from preoperatively is likely limiting acuity although the acuity did improve from 20/400 now to 20/70

## 2021-03-01 NOTE — Assessment & Plan Note (Signed)
Persistent yet overall minor vitreomacular adhesion and very focal fashion likely is responsible for some  Intraretinal changes in the fovea noted today.  These however are stable since last visit and in fact may be slightly improved as their outer retina no longer has photoreceptor debris accumulating.  With good acuity will continue to monitor the right eye

## 2021-03-01 NOTE — Progress Notes (Signed)
03/01/2021     CHIEF COMPLAINT Patient presents for  Chief Complaint  Patient presents with   Retina Follow Up    5 Mo F/U OU  Pt denies noticeable changes to [place] OU since last visit. Pt denies ocular pain, flashes of light, or floaters OU.         HISTORY OF PRESENT ILLNESS: Louis Bryan is a 85 y.o. male who presents to the clinic today for:   HPI     Retina Follow Up   Patient presents with  Other.  In both eyes.  This started 6 months ago.  Severity is mild.  Duration of 6 months.  Since onset it is stable. Additional comments: 5 Mo F/U OU  Pt denies noticeable changes to [place] OU since last visit. Pt denies ocular pain, flashes of light, or floaters OU.           Comments   6 mos fu OU oct. Patient states vision is stable and unchanged since last visit. Denies any new floaters or FOL.       Last edited by Nelva Nay on 03/01/2021  8:20 AM.      Referring physician: Ernesto Rutherford, MD [address] [place] [facility] [place],  [place] [address]  HISTORICAL INFORMATION:   Selected notes from the MEDICAL RECORD NUMBER    Lab Results  Component Value Date   HGBA1C 6.4 (H) 07/11/2017     CURRENT MEDICATIONS: No current outpatient medications on file. (Ophthalmic Drugs)   No current facility-administered medications for this visit. (Ophthalmic Drugs)   Current Outpatient Medications (Other)  Medication Sig   allopurinol (ZYLOPRIM) 100 MG tablet Take 1 tablet (100 mg total) by mouth daily.   aspirin 81 MG tablet Take 81 mg by mouth daily.   Docusate Calcium (STOOL SOFTENER PO) Take by mouth Nightly.   losartan-hydrochlorothiazide (HYZAAR) 50-12.5 MG tablet Take 1 tablet by mouth daily.   metoprolol succinate (TOPROL-XL) 25 MG 24 hr tablet Take 1 tablet (25 mg total) by mouth daily. with food   ondansetron (ZOFRAN) 4 MG tablet Take 1 tablet (4 mg total) by mouth every 6 (six) hours.   simvastatin (ZOCOR) 20 MG tablet Take 1 tablet (20 mg total) by mouth every  evening.   No current facility-administered medications for this visit. (Other)      REVIEW OF SYSTEMS:    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   History of exercise stress test [id number]   negative bruce protocol excercise stress test with scintigraphic evidence of diaphragmatic attenuation and mild apical thinning, dynamic gating was not performed secondary to frequent ectopy, low risk study   Hyperlipidemia    Hypertension    Shortness of breath    On exertion   Tobacco abuse    Past Surgical History:  Procedure Laterality Date   CATARACT EXTRACTION Left 07/2016   CHOLECYSTECTOMY N/A 10/27/2012   Procedure: LAPAROSCOPIC CHOLECYSTECTOMY WITH INTRAOPERATIVE CHOLANGIOGRAM;  Surgeon: Romie Levee, MD;  Location: [facility];  Service: General;  Laterality: N/A;    FAMILY HISTORY Family History  Problem Relation Age of Onset   Diabetes Sister    Diabetes Sister     SOCIAL HISTORY Social History   Tobacco Use   Smoking status: Former    Types: Cigarettes    Quit date: 06/13/1992    Years since quitting: 28.7   Smokeless tobacco: Never  Vaping  Use   Vaping Use: Never used  Substance Use Topics   Alcohol use: No   Drug use: No         OPHTHALMIC EXAM:  Base Eye Exam     Visual Acuity (ETDRS)       Right Left   Dist [place] 20/30 -2 20/70 -1   Dist ph [place] NI 20/60 -2         Tonometry (Tonopen, 8:26 AM)       Right Left   Pressure 7 8         Pupils       Shape React APD   Right Irregular Minimal None   Left Irregular Minimal None         Extraocular Movement       Right Left    Full Full         Neuro/Psych     Oriented x3: Yes   Mood/Affect: Normal         Dilation     Both eyes: 1.0% Mydriacyl, 2.5% Phenylephrine @ 8:26 AM           Slit Lamp and Fundus Exam     External Exam       Right Left   External  Normal Normal         Slit Lamp Exam       Right Left   Lids/Lashes Normal Normal   Conjunctiva/Sclera White and quiet White and quiet   Cornea Clear Clear   Anterior Chamber Deep and quiet Deep and quiet   Iris Round and reactive Round and reactive   Lens Posterior chamber intraocular lens Posterior chamber intraocular lens   Anterior Vitreous Normal Normal         Fundus Exam       Right Left   Posterior Vitreous Partial posterior vitreous detachment,  Vitrectomized   Disc Normal Splinter hemorrhage at superior pole of nerve   C/D Ratio 0.65 0.65   Macula Subtle color change to the RPE in the foveal region.  Negative Watzke. Pseudocystoid change in the fovea, Retinal pigment epithelial mottling, no hemorrhage, no macular thickening   Vessels Normal Normal   Periphery Normal Normal            IMAGING AND PROCEDURES  Imaging and Procedures for 03/01/21  OCT, Retina - OU - Both Eyes       Right Eye Quality was good. Scan locations included subfoveal. Central Foveal Thickness: 272. Progression has been stable. Findings include abnormal foveal contour, vitreous traction, vitreomacular adhesion , retinal drusen .   Left Eye Quality was good. Scan locations included subfoveal. Central Foveal Thickness: 414. Progression has improved. Findings include abnormal foveal contour.   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 Lyn Records of [facility] next week as already scheduled    Vitreomacular adhesion of right eye Persistent yet overall minor vitreomacular adhesion and very focal fashion likely is responsible for some  Intraretinal changes in the fovea noted today.  These however are stable since last visit and in fact may be slightly improved as their outer retina no longer has photoreceptor debris accumulating.  With good acuity will continue to monitor the right eye      ICD-10-CM   1. Vitreomacular adhesion of right eye  H43.821 OCT, Retina - OU - Both Eyes    2. Macular pucker, left eye  H35.372 OCT, Retina - OU - Both Eyes    3. History of vitrectomy  Z98.890     4. Glaucoma suspect, left eye  H40.002       1.  OD, will continue to monitor vitreomacular adhesion.  The photoreceptor layer has repaired itself relatively speaking as compared to 6 months previous OCT findings.  With good acuity will continue to monitor the right eye  2.  This with a history of massive vitreomacular traction released in 2018 via vitrectomy and resolution of subfoveal serous retinal detachment and decrease in the extent of foveal macular schisis.  With good eye acuity will observe  3.  Ophthalmic Meds Ordered this visit:  No orders of the defined types were placed in this encounter.      Return in about 6 months (around 08/30/2021) for dilate, COLOR FP, OCT.  There are no Patient Instructions on file for this visit.   Explained the diagnoses, plan, and follow  up with the patient and they expressed understanding.  Patient expressed understanding of the importance of proper follow up care.   Alford Highland Odie Rauen M.D. Diseases & Surgery of the Retina and Vitreous [facility] 03/01/21     Abbreviations: M myopia (nearsighted); A astigmatism; H hyperopia (farsighted); P presbyopia; Mrx spectacle prescription;  CTL contact lenses; OD right eye; OS left eye; OU both eyes  XT exotropia; ET esotropia; PEK punctate epithelial keratitis; PEE punctate epithelial erosions; DES dry eye syndrome; MGD meibomian gland dysfunction; ATs artificial tears; PFAT's preservative free artificial tears; NSC nuclear sclerotic cataract; PSC posterior subcapsular cataract; ERM epi-retinal membrane; PVD posterior vitreous detachment; RD retinal detachment; DM diabetes mellitus; DR diabetic retinopathy; NPDR non-proliferative diabetic retinopathy; PDR proliferative diabetic retinopathy; CSME clinically significant macular edema; DME diabetic macular edema; dbh dot blot hemorrhages; CWS cotton wool spot; POAG primary open angle glaucoma; C/D cup-to-disc ratio; HVF humphrey visual field; GVF goldmann visual field; OCT optical coherence tomography; IOP intraocular pressure; BRVO Branch retinal vein occlusion; CRVO central retinal vein occlusion; CRAO central retinal artery occlusion; BRAO branch retinal artery occlusion; RT retinal tear; SB scleral buckle; PPV pars plana vitrectomy; VH Vitreous hemorrhage; PRP panretinal laser photocoagulation; IVK intravitreal kenalog; VMT vitreomacular traction; MH Macular hole;  NVD neovascularization of the disc; NVE neovascularization elsewhere; AREDS age related eye disease study; ARMD age related macular degeneration; POAG primary open angle glaucoma; EBMD epithelial/anterior basement membrane dystrophy; ACIOL anterior chamber intraocular lens; IOL intraocular lens; PCIOL posterior chamber intraocular lens; Phaco/IOL phacoemulsification with  intraocular lens placement; PRK photorefractive keratectomy; LASIK laser assisted in situ  keratomileusis; HTN hypertension; DM diabetes mellitus; COPD chronic obstructive pulmonary disease

## 2021-03-01 NOTE — Assessment & Plan Note (Signed)
Splinter hemorrhage found at the superior pole of the nerve left eye today, follow-up with Dr. Lyn Records of [facility] next week as already scheduled

## 2021-09-01 ENCOUNTER — Ambulatory Visit (INDEPENDENT_AMBULATORY_CARE_PROVIDER_SITE_OTHER): Payer: Medicare Other | Admitting: Ophthalmology

## 2021-09-01 ENCOUNTER — Encounter (INDEPENDENT_AMBULATORY_CARE_PROVIDER_SITE_OTHER): Payer: Self-pay | Admitting: Ophthalmology

## 2021-09-01 ENCOUNTER — Other Ambulatory Visit: Payer: Self-pay

## 2021-09-01 DIAGNOSIS — H43821 Vitreomacular adhesion, right eye: Secondary | ICD-10-CM | POA: Diagnosis not present

## 2021-09-01 DIAGNOSIS — H33102 Unspecified retinoschisis, left eye: Secondary | ICD-10-CM | POA: Diagnosis not present

## 2021-09-01 DIAGNOSIS — H35372 Puckering of macula, left eye: Secondary | ICD-10-CM

## 2021-09-01 DIAGNOSIS — E119 Type 2 diabetes mellitus without complications: Secondary | ICD-10-CM

## 2021-09-01 NOTE — Assessment & Plan Note (Signed)
Overall this condition is spontaneously physiologically improving.  Outer the receptor disturbance and drusenoid deposits has diminished substantially.  Macular transit retinal scarring has diminished.  Most importantly acuity is stable and improving.  We will continue to observe OD ?

## 2021-09-01 NOTE — Assessment & Plan Note (Signed)
Improved overall since surgery for release of VMT.  But persistence likely due to overlying residual epiretinal membrane/ILM ?

## 2021-09-01 NOTE — Assessment & Plan Note (Signed)
No detectable diabetic retinopathy today 

## 2021-09-01 NOTE — Progress Notes (Signed)
? ? ?09/01/2021 ? ?  ? ?CHIEF COMPLAINT ?Patient presents for  ?Chief Complaint  ?Patient presents with  ? Retina Follow Up  ? ? ? ? ?HISTORY OF PRESENT ILLNESS: ?Louis Bryan is a 86 y.o. male who presents to the clinic today for:  ? ?HPI   ? ? Retina Follow Up   ? ?      ? Diagnosis: Other (Vitreomacular Adhesion)  ? Laterality: right eye  ? Onset: 6 months ago  ? ?  ?  ? ? Comments   ?6 mos fu OU oct. ?Patient states vision is stable and unchanged since last visit. Denies any new floaters or FOL. ? ? ?  ?  ?Last edited by Laurin Coder on 09/01/2021  8:16 AM.  ?  ? ? ?Referring physician: ?Clent Jacks, MD ?[address] ?[address] ?[place],  [place] [address] ? ?HISTORICAL INFORMATION:  ? ?Selected notes from the [facility] ?  ? ?Lab Results  ?Component Value Date  ? HGBA1C 6.4 (H) 07/11/2017  ?  ? ?CURRENT MEDICATIONS: ?No current outpatient medications on file. (Ophthalmic Drugs)  ? ?No current facility-administered medications for this visit. (Ophthalmic Drugs)  ? ?Current Outpatient Medications (Other)  ?Medication Sig  ? allopurinol (ZYLOPRIM) 100 MG tablet Take 1 tablet (100 mg total) by mouth daily.  ? aspirin 81 MG tablet Take 81 mg by mouth daily.  ? Docusate Calcium (STOOL SOFTENER PO) Take by mouth Nightly.  ? losartan-hydrochlorothiazide (HYZAAR) 50-12.5 MG tablet Take 1 tablet by mouth daily.  ? metoprolol succinate (TOPROL-XL) 25 MG 24 hr tablet Take 1 tablet (25 mg total) by mouth daily. with food  ? ondansetron (ZOFRAN) 4 MG tablet Take 1 tablet (4 mg total) by mouth every 6 (six) hours.  ? simvastatin (ZOCOR) 20 MG tablet Take 1 tablet (20 mg total) by mouth every evening.  ? ?No current facility-administered medications for this visit. (Other)  ? ? ? ? ?REVIEW OF SYSTEMS: ?ROS   ?Negative for: Constitutional, Gastrointestinal, Neurological, Skin, Genitourinary, Musculoskeletal, HENT, Endocrine, Cardiovascular, Eyes, Respiratory, Psychiatric, Allergic/Imm, Heme/Lymph ?Last edited by Hurman Horn, MD on 09/01/2021  8:58 AM.  ?  ? ? ? ?ALLERGIES ?No Known Allergies ? ?PAST MEDICAL HISTORY ?Past Medical History:  ?Diagnosis Date  ? Chronotropic incompetence - medication related 09/24/2012  ? History of exercise stress test [phone]  ? negative bruce protocol excercise stress test with scintigraphic evidence of diaphragmatic attenuation and mild apical thinning, dynamic gating was not performed secondary to frequent ectopy, low risk study  ? Hyperlipidemia   ? Hypertension   ? Shortness of breath   ? On exertion  ? Tobacco abuse   ? ?Past Surgical History:  ?Procedure Laterality Date  ? CATARACT EXTRACTION Left 07/2016  ? CHOLECYSTECTOMY N/A 10/27/2012  ? Procedure: LAPAROSCOPIC CHOLECYSTECTOMY WITH INTRAOPERATIVE CHOLANGIOGRAM;  Surgeon: Leighton Ruff, MD;  Location: [facility];  Service: General;  Laterality: N/A;  ? ? ?FAMILY HISTORY ?Family History  ?Problem Relation Age of Onset  ? Diabetes Sister   ? Diabetes Sister   ? ? ?SOCIAL HISTORY ?Social History  ? ?Tobacco Use  ? Smoking status: Former  ?  Types: Cigarettes  ?  Quit date: 06/13/1992  ?  Years since quitting: 29.2  ? Smokeless tobacco: Never  ?Vaping Use  ? Vaping Use: Never used  ?Substance Use Topics  ? Alcohol use: No  ? Drug use: No  ? ?  ? ?  ? ?OPHTHALMIC EXAM: ? ?Base  Eye Exam   ? ? Visual Acuity (ETDRS)   ? ?   Right Left  ? Dist Emma 20/30 -1 20/70 -1+2  ? Dist ph   20/60 -2  ? ?  ?  ? ? Tonometry (Tonopen, 8:09 AM)   ? ?   Right Left  ? Pressure 10 7  ? ?  ?  ? ? Pupils   ? ?   Shape React APD  ? Right Irregular Minimal None  ? Left Irregular Minimal None  ? ?  ?  ? ? Visual Fields (Counting fingers)   ? ?   Left Right  ?  Full Full  ? ?  ?  ? ? Extraocular Movement   ? ?   Right Left  ?  Full, Nystagmus Full, Nystagmus  ? ?  ?  ? ? Neuro/Psych   ? ? Oriented x3: Yes  ? Mood/Affect: Normal  ? ?  ?  ? ? Dilation   ? ? Both eyes: 1.0% Mydriacyl, 2.5% Phenylephrine @ 8:09 AM  ? ?  ?  ? ?  ? ?Slit Lamp and Fundus Exam   ? ? External Exam   ? ?    Right Left  ? External Normal Normal  ? ?  ?  ? ? Slit Lamp Exam   ? ?   Right Left  ? Lids/Lashes Normal Normal  ? Conjunctiva/Sclera White and quiet White and quiet  ? Cornea Clear Clear  ? Anterior Chamber Deep and quiet Deep and quiet  ? Iris Round and reactive Round and reactive  ? Lens Posterior chamber intraocular lens Posterior chamber intraocular lens  ? Anterior Vitreous Normal Normal  ? ?  ?  ? ? Fundus Exam   ? ?   Right Left  ? Posterior Vitreous Partial posterior vitreous detachment,  Vitrectomized  ? Disc Normal Splinter hemorrhage at superior pole of nerve  ? C/D Ratio 0.65 0.65  ? Macula Subtle color change to the RPE in the foveal region.  Negative Watzke. Pseudocystoid change in the fovea, Retinal pigment epithelial mottling, no hemorrhage, no macular thickening  ? Vessels Normal, no DR Normal, no DR  ? Periphery Normal Normal  ? ?  ?  ? ?  ? ? ?IMAGING AND PROCEDURES  ?Imaging and Procedures for 09/01/21 ? ?OCT, Retina - OU - Both Eyes   ? ?   ?Right Eye ?Quality was good. Scan locations included subfoveal. Central Foveal Thickness: 269. Progression has improved. Findings include abnormal foveal contour, vitreous traction, vitreomacular adhesion , retinal drusen .  ? ?Left Eye ?Quality was good. Scan locations included subfoveal. Central Foveal Thickness: 409. Progression has improved. Findings include abnormal foveal contour, macular pucker.  ? ?Notes ?OD with outer retinal drusenoid deposit in the subfoveal location likely secondary to vitreous traction on the inner retina, this has improved over the last 3 years OD, with less disturbance to the photoreceptor and ellipsoid layers.  Very minor inner foveal torsion from minor residual VMT, will continue to monitor OD with observation ? ? 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 ? ?Acuity left eye might be limited by this residual foveal macular schisis and damage from the previous severe VMT. ? ?Minor epiretinal membrane noted ? ? ? ?  ? ? ?  ?  ? ?  ?  ASSESSMENT/PLAN: ? ?Epiretinal membrane, left eye ?Minor epiretinal membrane might be contributing to the residual Foveal macular retinoschisis OS. ? ?On balance, could consider vitrectomy ILM peel.  At the time of original surgery for severe VMT, with serous outer retinal detachment and foveal macular schisis 3 times a current size, the ILM was not peeled so as to minimize the risk of formation of macular hole.  The outer retina has been restored now. ? ?Vitreomacular adhesion of right eye ?Overall this condition is spontaneously physiologically improving.  Outer the receptor disturbance and drusenoid deposits has diminished substantially.  Macular transit retinal scarring has diminished.  Most importantly acuity is stable and improving.  We will continue to observe OD ? ?Type 2 diabetes mellitus without complications ([place]) ?No detectable diabetic retinopathy today ? ?Macular retinoschisis, left ?Improved overall since surgery for release of VMT.  But persistence likely due to overlying residual epiretinal membrane/ILM  ? ?  ICD-10-CM   ?1. Vitreomacular adhesion of right eye  H43.821 OCT, Retina - OU - Both Eyes  ?  ?2. Epiretinal membrane, left eye  H35.372   ?  ?3. Type 2 diabetes mellitus without complication, unspecified whether long term insulin use (HCC)  E11.9   ?  ?4. Macular retinoschisis, left  H33.102   ?  ? ? ?1.  OD, stable and improving spontaneously over time as vitreomacular adhesion spontaneously improved.  Outer retina improving as well with restoration of photoreceptor layer louse of drusenoid deposit and improved acuity will observe ? ?2.  OS vastly improved macular findings yet still residual foveal macular schisis limiting  acuity.  Minor epiretinal membrane could be preventing the residual foveal macular schisis from collapsing due to traction and tangential faction. ? ?3.  I discussed with the patient that I believe there is a 2 o

## 2021-09-01 NOTE — Assessment & Plan Note (Signed)
Minor epiretinal membrane might be contributing to the residual Foveal macular retinoschisis OS. ? ?On balance, could consider vitrectomy ILM peel.  At the time of original surgery for severe VMT, with serous outer retinal detachment and foveal macular schisis 3 times a current size, the ILM was not peeled so as to minimize the risk of formation of macular hole.  The outer retina has been restored now. ?

## 2021-10-21 ENCOUNTER — Inpatient Hospital Stay (HOSPITAL_COMMUNITY)
Admission: EM | Admit: 2021-10-21 | Discharge: 2021-10-23 | DRG: 176 | Disposition: A | Discharge status: Home / Self Care | Payer: Medicare Other | Attending: Family Medicine | Admitting: Family Medicine

## 2021-10-21 ENCOUNTER — Other Ambulatory Visit: Payer: Self-pay

## 2021-10-21 ENCOUNTER — Emergency Department (HOSPITAL_COMMUNITY): Payer: Medicare Other

## 2021-10-21 ENCOUNTER — Encounter (HOSPITAL_COMMUNITY): Payer: Self-pay

## 2021-10-21 DIAGNOSIS — E119 Type 2 diabetes mellitus without complications: Secondary | ICD-10-CM | POA: Diagnosis present

## 2021-10-21 DIAGNOSIS — Z79899 Other long term (current) drug therapy: Secondary | ICD-10-CM

## 2021-10-21 DIAGNOSIS — I2609 Other pulmonary embolism with acute cor pulmonale: Secondary | ICD-10-CM

## 2021-10-21 DIAGNOSIS — M1A0710 Idiopathic chronic gout, right ankle and foot, without tophus (tophi): Secondary | ICD-10-CM | POA: Diagnosis not present

## 2021-10-21 DIAGNOSIS — I10 Essential (primary) hypertension: Secondary | ICD-10-CM | POA: Diagnosis present

## 2021-10-21 DIAGNOSIS — Z66 Do not resuscitate: Secondary | ICD-10-CM | POA: Diagnosis present

## 2021-10-21 DIAGNOSIS — Z7982 Long term (current) use of aspirin: Secondary | ICD-10-CM

## 2021-10-21 DIAGNOSIS — E785 Hyperlipidemia, unspecified: Secondary | ICD-10-CM | POA: Diagnosis not present

## 2021-10-21 DIAGNOSIS — M109 Gout, unspecified: Secondary | ICD-10-CM | POA: Diagnosis present

## 2021-10-21 DIAGNOSIS — I2699 Other pulmonary embolism without acute cor pulmonale: Secondary | ICD-10-CM | POA: Diagnosis not present

## 2021-10-21 DIAGNOSIS — Z87891 Personal history of nicotine dependence: Secondary | ICD-10-CM

## 2021-10-21 DIAGNOSIS — D72829 Elevated white blood cell count, unspecified: Secondary | ICD-10-CM | POA: Diagnosis present

## 2021-10-21 DIAGNOSIS — Z833 Family history of diabetes mellitus: Secondary | ICD-10-CM

## 2021-10-21 HISTORY — DX: Other pulmonary embolism without acute cor pulmonale: I26.99

## 2021-10-21 LAB — TROPONIN I (HIGH SENSITIVITY)
Troponin I (High Sensitivity): 5 ng/L (ref <18)
Troponin I (High Sensitivity): 4 ng/L (ref <18)
Troponin I (High Sensitivity): 4 ng/L (ref <18)
Troponin I (High Sensitivity): 5 ng/L (ref <18)

## 2021-10-21 LAB — CBC
HCT: 46.5 % (ref 39.0–52.0)
Hemoglobin: 15.1 g/dL (ref 13.0–17.0)
MCH: 30.8 pg (ref 26.0–34.0)
MCHC: 32.5 g/dL (ref 30.0–36.0)
MCV: 94.9 fL (ref 80.0–100.0)
Platelets: 186 10*3/uL (ref 150–400)
RBC: 4.9 MIL/uL (ref 4.22–5.81)
RDW: 14.3 % (ref 11.5–15.5)
WBC: 11.8 10*3/uL — ABNORMAL HIGH (ref 4.0–10.5)
nRBC: 0 % (ref 0.0–0.2)

## 2021-10-21 LAB — BASIC METABOLIC PANEL
Anion gap: 10 (ref 5–15)
BUN: 16 mg/dL (ref 8–23)
CO2: 25 mmol/L (ref 22–32)
Calcium: 9.1 mg/dL (ref 8.9–10.3)
Chloride: 108 mmol/L (ref 98–111)
Creatinine, Ser: 1.34 mg/dL — ABNORMAL HIGH (ref 0.61–1.24)
GFR, Estimated: 52 mL/min — ABNORMAL LOW (ref >60)
Glucose, Bld: 144 mg/dL — ABNORMAL HIGH (ref 70–99)
Potassium: 3.7 mmol/L (ref 3.5–5.1)
Sodium: 143 mmol/L (ref 135–145)

## 2021-10-21 LAB — CBG MONITORING, ED: Glucose-Capillary: 142 mg/dL — ABNORMAL HIGH (ref 70–99)

## 2021-10-21 LAB — D-DIMER, QUANTITATIVE: D-Dimer, Quant: 10.93 ug/mL-FEU — ABNORMAL HIGH (ref 0.00–0.50)

## 2021-10-21 LAB — ANTITHROMBIN III: AntiThromb III Func: 102 % (ref 75–120)

## 2021-10-21 MED ORDER — METOPROLOL SUCCINATE ER 25 MG PO TB24
25.0000 mg | ORAL_TABLET | Freq: Every day | ORAL | Status: DC
  Administered 2021-10-22 – 2021-10-23 (×2): 25 mg via ORAL
  Filled 2021-10-21 (×2): qty 1

## 2021-10-21 MED ORDER — HEPARIN BOLUS VIA INFUSION
4000.0000 [IU] | Freq: Once | INTRAVENOUS | Status: AC
  Administered 2021-10-21: 4000 [IU] via INTRAVENOUS
  Filled 2021-10-21: qty 4000

## 2021-10-21 MED ORDER — HEPARIN (PORCINE) 25000 UT/250ML-% IV SOLN
1050.0000 [IU]/h | INTRAVENOUS | Status: DC
Start: 2021-10-21 — End: 2021-10-22
  Administered 2021-10-21: 1250 [IU]/h via INTRAVENOUS
  Administered 2021-10-22: 1150 [IU]/h via INTRAVENOUS
  Filled 2021-10-21 (×2): qty 250

## 2021-10-21 MED ORDER — SODIUM CHLORIDE 0.9 % IV BOLUS
1000.0000 mL | Freq: Once | INTRAVENOUS | Status: AC
  Administered 2021-10-21: 1000 mL via INTRAVENOUS

## 2021-10-21 MED ORDER — POLYETHYLENE GLYCOL 3350 17 G PO PACK
17.0000 g | PACK | Freq: Every day | ORAL | Status: DC | PRN
Start: 2021-10-21 — End: 2021-10-23

## 2021-10-21 MED ORDER — SIMVASTATIN 20 MG PO TABS
40.0000 mg | ORAL_TABLET | Freq: Every day | ORAL | Status: DC
  Administered 2021-10-22: 40 mg via ORAL
  Filled 2021-10-21: qty 2

## 2021-10-21 MED ORDER — SODIUM CHLORIDE 0.9% FLUSH
3.0000 mL | Freq: Two times a day (BID) | INTRAVENOUS | Status: DC
  Administered 2021-10-22: 3 mL via INTRAVENOUS

## 2021-10-21 MED ORDER — HYDROCHLOROTHIAZIDE 12.5 MG PO TABS
12.5000 mg | ORAL_TABLET | Freq: Every day | ORAL | Status: DC
  Administered 2021-10-22 – 2021-10-23 (×2): 12.5 mg via ORAL
  Filled 2021-10-21 (×2): qty 1

## 2021-10-21 MED ORDER — LOSARTAN POTASSIUM 50 MG PO TABS
50.0000 mg | ORAL_TABLET | Freq: Every day | ORAL | Status: DC
  Administered 2021-10-22 – 2021-10-23 (×2): 50 mg via ORAL
  Filled 2021-10-21 (×2): qty 1

## 2021-10-21 MED ORDER — ACETAMINOPHEN 650 MG RE SUPP: 650.0000 mg | Freq: Four times a day (QID) | RECTAL | Status: DC | PRN

## 2021-10-21 MED ORDER — KETOROLAC TROMETHAMINE 30 MG/ML IJ SOLN
30.0000 mg | Freq: Once | INTRAMUSCULAR | Status: AC
  Administered 2021-10-21: 30 mg via INTRAVENOUS
  Filled 2021-10-21: qty 1

## 2021-10-21 MED ORDER — LOSARTAN POTASSIUM-HCTZ 50-12.5 MG PO TABS: 1.0000 | ORAL_TABLET | Freq: Every day | ORAL | Status: DC

## 2021-10-21 MED ORDER — IOHEXOL 350 MG/ML SOLN
70.0000 mL | Freq: Once | INTRAVENOUS | Status: AC | PRN
Start: 2021-10-21 — End: 2021-10-21
  Administered 2021-10-21: 70 mL via INTRAVENOUS

## 2021-10-21 MED ORDER — ACETAMINOPHEN 325 MG PO TABS: 650.0000 mg | ORAL_TABLET | Freq: Four times a day (QID) | ORAL | Status: DC | PRN

## 2021-10-21 MED ORDER — ALLOPURINOL 100 MG PO TABS
100.0000 mg | ORAL_TABLET | Freq: Every day | ORAL | Status: DC
  Administered 2021-10-22 – 2021-10-23 (×2): 100 mg via ORAL
  Filled 2021-10-21 (×2): qty 1

## 2021-10-21 MED ORDER — KETOROLAC TROMETHAMINE 15 MG/ML IJ SOLN: 15.0000 mg | Freq: Three times a day (TID) | INTRAMUSCULAR | Status: DC | PRN

## 2021-10-21 NOTE — ED Triage Notes (Addendum)
Complains of left shoulder and chest pain that started last night but sweating that has been going on for a week.  Denies nausea and dizziness. Reports pain in shoulder is more with movement.  Complains of no energy and excessive sweating.

## 2021-10-21 NOTE — ED Provider Notes (Signed)
[facility] EMERGENCY DEPARTMENT Provider Note   CSN: [id number] Arrival date & time: 10/21/21  3846     History  Chief Complaint  Patient presents with   Chest Pain    Louis Bryan is a 86 y.o. male.  Pt is a 86 yo male with a pmhx significant for htn, HLD, and tobacco abuse.  Pt said he woke up this am around 0600 with left sided cp going to his shoulder.  It is better now.  Pt said that it hurts when he moves his left shoulder.  Pt denies any sob.  No n/v.         Home Medications Prior to Admission medications   Medication Sig Start Date End Date Taking? Authorizing Provider  acetaminophen (TYLENOL) 500 MG tablet Take 500 mg by mouth every 6 (six) hours as needed for moderate pain.   Yes [provider]  allopurinol (ZYLOPRIM) 100 MG tablet Take 1 tablet (100 mg total) by mouth daily. 07/11/17  Yes Porfirio Oar, PA  aspirin 81 MG tablet Take 81 mg by mouth every evening.   Yes [provider]  Docusate Calcium (STOOL SOFTENER PO) Take 1 capsule by mouth every evening.   Yes [provider]  losartan-hydrochlorothiazide (HYZAAR) 50-12.5 MG tablet Take 1 tablet by mouth daily. 08/28/17  Yes Jeffery, [name], PA  metoprolol succinate (TOPROL-XL) 25 MG 24 hr tablet Take 1 tablet (25 mg total) by mouth daily. with food 08/28/17  Yes Jeffery, [name], PA  simvastatin (ZOCOR) 40 MG tablet Take 40 mg by mouth at bedtime. 09/20/21  Yes [provider]      Allergies    Patient has no known allergies.    Review of Systems   Review of Systems  Cardiovascular:  Positive for chest pain.  All other systems reviewed and are negative.   Physical Exam Updated Vital Signs BP (!) 136/92   Pulse 79   Temp 98 F (36.7 C) (Oral)   Resp 19   Ht 5\' 5"  (1.651 m)   Wt 77.1 kg   SpO2 95%   BMI 28.29 kg/m  Physical Exam Vitals and nursing note reviewed.  Constitutional:      Appearance: He is well-developed.  HENT:     Head:  Normocephalic and atraumatic.  Eyes:     Extraocular Movements: Extraocular movements intact.     Pupils: Pupils are equal, round, and reactive to light.  Cardiovascular:     Rate and Rhythm: Normal rate and regular rhythm.     Heart sounds: Normal heart sounds.  Pulmonary:     Effort: Pulmonary effort is normal.     Breath sounds: Normal breath sounds.  Abdominal:     General: Bowel sounds are normal.     Palpations: Abdomen is soft.  Musculoskeletal:        General: Normal range of motion.     Cervical back: Normal range of motion and neck supple.  Skin:    General: Skin is warm.     Capillary Refill: Capillary refill takes less than 2 seconds.  Neurological:     General: No focal deficit present.     Mental Status: He is alert and oriented to person, place, and time.  Psychiatric:        Mood and Affect: Mood normal.        Behavior: Behavior normal.     ED Results / Procedures / Treatments   Labs (all labs ordered are listed, but only  abnormal results are displayed) Labs Reviewed  BASIC METABOLIC PANEL - Abnormal; Notable for the following components:      Result Value   Glucose, Bld 144 (*)    Creatinine, Ser 1.34 (*)    GFR, Estimated 52 (*)    All other components within normal limits  CBC - Abnormal; Notable for the following components:   WBC 11.8 (*)    All other components within normal limits  D-DIMER, QUANTITATIVE - Abnormal; Notable for the following components:   D-Dimer, Quant 10.93 (*)    All other components within normal limits  CBG MONITORING, ED - Abnormal; Notable for the following components:   Glucose-Capillary 142 (*)    All other components within normal limits  CBC  HEPARIN LEVEL (UNFRACTIONATED)  BRAIN NATRIURETIC PEPTIDE  COMPREHENSIVE METABOLIC PANEL  ANTITHROMBIN III  PROTEIN C ACTIVITY  PROTEIN C, TOTAL  PROTEIN S ACTIVITY  PROTEIN S, TOTAL  LUPUS ANTICOAGULANT PANEL  BETA-2-GLYCOPROTEIN I ABS, IGG/M/A  HOMOCYSTEINE  FACTOR 5  LEIDEN  PROTHROMBIN GENE MUTATION  CARDIOLIPIN ANTIBODIES, IGG, IGM, IGA  TROPONIN I (HIGH SENSITIVITY)  TROPONIN I (HIGH SENSITIVITY)  TROPONIN I (HIGH SENSITIVITY)  TROPONIN I (HIGH SENSITIVITY)    EKG EKG Interpretation  Date/Time:  Thursday October 21 2021 07:14:26 EDT Ventricular Rate:  94 PR Interval:  152 QRS Duration: 84 QT Interval:  364 QTC Calculation: 455 R Axis:   33 Text Interpretation: Normal sinus rhythm Normal ECG No previous ECGs available Confirmed by Jacalyn Lefevre [phone]) on 10/21/2021 4:39:59 PM  Radiology CT Angio Chest Pulmonary Embolism (PE) W or WO Contrast  Result Date: 10/21/2021 CLINICAL DATA:  Pulmonary embolism (PE) suspected, high prob Left-sided chest pain. EXAM: CT ANGIOGRAPHY CHEST WITH CONTRAST TECHNIQUE: Multidetector CT imaging of the chest was performed using the standard protocol during bolus administration of intravenous contrast. Multiplanar CT image reconstructions and MIPs were obtained to evaluate the vascular anatomy. RADIATION DOSE REDUCTION: This exam was performed according to the departmental dose-optimization program which includes automated exposure control, adjustment of the mA and/or kV according to patient size and/or use of iterative reconstruction technique. CONTRAST:  16mL OMNIPAQUE IOHEXOL 350 MG/ML SOLN COMPARISON:  Radiograph earlier today no prior chest CT. FINDINGS: Cardiovascular: Examination is positive for bilateral pulmonary emboli. There are filling defects within the segmental branches to both upper lobes. There is lobar involvement of the right middle lobe. Probable subsegmental involvement in the right lower lobe, although obscured by motion on the current exam. There may be an element of chronic clot as some of the filling defects are a centric and peripheral. Overall clot burden is moderate. Positive for right heart strain with RV to LV ratio of 1.22. the heart is normal in size. Aortic tortuosity with minimal  atherosclerosis. No pericardial effusion. Mediastinum/Nodes: No mediastinal adenopathy. No enlarged hilar lymph nodes. Small hiatal hernia. No visualized thyroid nodule. Lungs/Pleura: Moderate emphysema. Mild central bronchial thickening. Perifissural atelectasis in the left upper lobe. Subsegmental atelectasis in the right greater than left lower lobes. No confluent consolidation. No pleural effusion. No pulmonary mass. The trachea and central bronchi are patent. Upper Abdomen: Simple cysts in both kidneys are partially included, needing no further follow-up. Cholecystectomy. Left colonic diverticulosis. No acute upper abdominal findings. Musculoskeletal: Prominent Schmorl's node with slight loss of height involving T11 superior endplate, appearing chronic. There are no acute or suspicious osseous abnormalities. Review of the MIP images confirms the above findings. IMPRESSION: 1. Positive for acute pulmonary embolus with CT evidence of right  heart strain (RV/LV Ratio = 1.22) consistent with at least submassive (intermediate risk) PE. The presence of right heart strain has been associated with an increased risk of morbidity and mortality. Please refer to the "Code PE Focused" order set in EPIC. 2. Emphysema with mild bronchial thickening. Scattered atelectasis in the left upper and both lower lobes. Aortic Atherosclerosis (ICD10-I70.0) and Emphysema (ICD10-J43.9). Critical Value/emergent results were called by telephone at the time of interpretation on 10/21/2021 at 9:51 pm to provider Gaines Cartmell , who verbally acknowledged these results. Electronically Signed   By: Narda Rutherford M.D.   On: 10/21/2021 21:52   DG Chest 2 View  Result Date: 10/21/2021 CLINICAL DATA:  86 year old male with history of left shoulder and left upper chest pain. EXAM: CHEST - 2 VIEW COMPARISON:  Chest x-ray 10/02/2015. FINDINGS: Lung volumes are normal. No consolidative airspace disease. No pleural effusions. No pneumothorax. No  pulmonary nodule or mass noted. Pulmonary vasculature and the cardiomediastinal silhouette are within normal limits. Atherosclerosis in the thoracic aorta. IMPRESSION: 1.  No radiographic evidence of acute cardiopulmonary disease. 2. Aortic atherosclerosis. Electronically Signed   By: Trudie Reed M.D.   On: 10/21/2021 07:38    Procedures Procedures    Medications Ordered in ED Medications  heparin ADULT infusion 100 units/mL (25000 units/238mL) (1,250 Units/hr Intravenous New Bag/Given 10/21/21 2220)  allopurinol (ZYLOPRIM) tablet 100 mg (has no administration in time range)  simvastatin (ZOCOR) tablet 40 mg (has no administration in time range)  metoprolol succinate (TOPROL-XL) 24 hr tablet 25 mg (has no administration in time range)  losartan-hydrochlorothiazide (HYZAAR) 50-12.5 MG per tablet 1 tablet (has no administration in time range)  sodium chloride flush (NS) 0.9 % injection 3 mL (has no administration in time range)  acetaminophen (TYLENOL) tablet 650 mg (has no administration in time range)    Or  acetaminophen (TYLENOL) suppository 650 mg (has no administration in time range)  ketorolac (TORADOL) 15 MG/ML injection 15 mg (has no administration in time range)  polyethylene glycol (MIRALAX / GLYCOLAX) packet 17 g (has no administration in time range)  sodium chloride 0.9 % bolus 1,000 mL (1,000 mLs Intravenous New Bag/Given 10/21/21 1717)  ketorolac (TORADOL) 30 MG/ML injection 30 mg (30 mg Intravenous Given 10/21/21 1714)  iohexol (OMNIPAQUE) 350 MG/ML injection 70 mL (70 mLs Intravenous Contrast Given 10/21/21 2139)  heparin bolus via infusion 4,000 Units (4,000 Units Intravenous Bolus from Bag 10/21/21 2221)    ED Course/ Medical Decision Making/ A&P                           Medical Decision Making Amount and/or Complexity of Data Reviewed Labs: ordered. Radiology: ordered.  Risk Prescription drug management. Decision regarding hospitalization.   This patient  presents to the ED for concern of chest pain, this involves an extensive number of treatment options, and is a complaint that carries with it a high risk of complications and morbidity.  The differential diagnosis includes msk, cad, pulm, gi   Co morbidities that complicate the patient evaluation  htn, HLD, and tobacco abuse   Additional history obtained:  Additional history obtained from epic chart review    Lab Tests:  I Ordered, and personally interpreted labs.  The pertinent results include:  cbc nl, bmp nl other than mild bump in Cr 1.34 (last Cr was 1.19 on 08/24/21); troponins neg, DDimer +   Imaging Studies ordered:  I ordered imaging studies including CXR  I  independently visualized and interpreted imaging which showed  IMPRESSION:  1.  No radiographic evidence of acute cardiopulmonary disease.  2. Aortic atherosclerosis.   I agree with the radiologist interpretation   Cardiac Monitoring:  The patient was maintained on a cardiac monitor.  I personally viewed and interpreted the cardiac monitored which showed an underlying rhythm of: nsr   Medicines ordered and prescription drug management:  I ordered medication including toradol  for pain  Reevaluation of the patient after these medicines showed that the patient improved I have reviewed the patients home medicines and have made adjustments as needed   Test Considered:  Ct chest   Critical Interventions:  Ct chest/heparin   Consultations Obtained:  I requested consultation with the hospitalists (Dr. Alinda Money),  and discussed lab and imaging findings as well as pertinent plan - he will admit   Problem List / ED Course:  PE: no hx of PE or blood clots.  Pt denies any recent surgery or travel.  Hypercoagulable panel ordered.  Heparin drip ordered.  Pt is not hypoxic and troponins are normal.  Pt is stable for the floor.  Dr. Alinda Money will admit.   Reevaluation:  After the interventions noted above, I  reevaluated the patient and found that they have :improved   Social Determinants of Health:  Lives alone   Dispostion:  After consideration of the diagnostic results and the patients response to treatment, I feel that the patent would benefit from admission.    CRITICAL CARE Performed by: Jacalyn Lefevre   Total critical care time: 30 minutes  Critical care time was exclusive of separately billable procedures and treating other patients.  Critical care was necessary to treat or prevent imminent or life-threatening deterioration.  Critical care was time spent personally by me on the following activities: development of treatment plan with patient and/or surrogate as well as nursing, discussions with consultants, evaluation of patient's response to treatment, examination of patient, obtaining history from patient or surrogate, ordering and performing treatments and interventions, ordering and review of laboratory studies, ordering and review of radiographic studies, pulse oximetry and re-evaluation of patient's condition.         Final Clinical Impression(s) / ED Diagnoses Final diagnoses:  Bilateral pulmonary embolism (HCC)  Cor pulmonale, acute (HCC)    Rx / DC Orders ED Discharge Orders     None         Jacalyn Lefevre, MD 10/21/21 2223

## 2021-10-21 NOTE — H&P (Signed)
History and Physical   Louis Bryan TKP:[id number] DOB: 1936/04/21 DOA: 10/21/2021  PCP: Patient, No Pcp Per   Patient coming from: Home  Chief Complaint: Chest pain  HPI: Louis Bryan is a 86 y.o. male with medical history significant of hypertension, hyperlipidemia, gout, diabetes, glaucoma presenting with chest pain.  Patient woke up at 6 AM with brief episode.  Left-sided chest pain radiating to his shoulder.  This improved over the course the day but has pain with moving his shoulder still.  On further questioning he states he has had intermittent shortness of breath with "good days and bad days "for the past week or so.  He denies fevers, chills, abdominal pain, constipation, diarrhea, nausea, vomiting.  ED Course: Vital signs in the ED significant for respiratory rate in the teens to 20s and blood pressure in the 120s to 150s systolic.  Lab work-up included BMP with creatinine elevated to 1.34 but near baseline of 1.1, glucose 149.  CBC with mild leukocytosis to 11.8.  Troponin negative x3.  D-dimer elevated at 10.93.  Chest x-ray showed no acute abnormality.  CT PE study stop positive for PE with right heart strain and RV to LV ratio of 1.22 consistent with at least submassive pulmonary embolus.  Patient received Toradol and a liter of fluids and was started on heparin drip.  Review of Systems: As per HPI otherwise all other systems reviewed and are negative.  Past Medical History:  Diagnosis Date   Chronotropic incompetence - medication related 09/24/2012   History of exercise stress test [id number]   negative bruce protocol excercise stress test with scintigraphic evidence of diaphragmatic attenuation and mild apical thinning, dynamic gating was not performed secondary to frequent ectopy, low risk study   Hyperlipidemia    Hypertension    Shortness of breath    On exertion   Tobacco abuse     Past Surgical History:  Procedure Laterality Date   CATARACT EXTRACTION Left 07/2016    CHOLECYSTECTOMY N/A 10/27/2012   Procedure: LAPAROSCOPIC CHOLECYSTECTOMY WITH INTRAOPERATIVE CHOLANGIOGRAM;  Surgeon: Romie Levee, MD;  Location: [facility];  Service: General;  Laterality: N/A;    Social History  reports that he quit smoking about 29 years ago. He has never used smokeless tobacco. He reports that he does not drink alcohol and does not use drugs.  No Known Allergies  Family History  Problem Relation Age of Onset   Diabetes Sister    Diabetes Sister     Prior to Admission medications   Medication Sig Start Date End Date Taking? Authorizing Provider  acetaminophen (TYLENOL) 500 MG tablet Take 500 mg by mouth every 6 (six) hours as needed for moderate pain.   Yes [provider]  allopurinol (ZYLOPRIM) 100 MG tablet Take 1 tablet (100 mg total) by mouth daily. 07/11/17  Yes Porfirio Oar, PA  aspirin 81 MG tablet Take 81 mg by mouth every evening.   Yes [provider]  Docusate Calcium (STOOL SOFTENER PO) Take 1 capsule by mouth every evening.   Yes [provider]  losartan-hydrochlorothiazide (HYZAAR) 50-12.5 MG tablet Take 1 tablet by mouth daily. 08/28/17  Yes Jeffery, [name], PA  metoprolol succinate (TOPROL-XL) 25 MG 24 hr tablet Take 1 tablet (25 mg total) by mouth daily. with food 08/28/17  Yes Jeffery, [name], PA  simvastatin (ZOCOR) 40 MG tablet Take 40 mg by mouth at bedtime. 09/20/21  Yes [provider]  Reviewed on admission  Physical Exam: Vitals:   10/21/21 1915 10/21/21  2015 10/21/21 2045 10/21/21 2215  BP: 126/85 (!) 140/94 (!) 141/98 (!) 136/92  Pulse: 76 74 85 79  Resp: (!) 21 (!) 22 (!) 21 19  Temp:      TempSrc:      SpO2: 97% 99% 97% 95%  Weight:      Height:        Physical Exam Constitutional:      General: He is not in acute distress.    Appearance: Normal appearance.  HENT:     Head: Normocephalic and atraumatic.     Mouth/Throat:     Mouth: Mucous membranes are moist.     Pharynx: Oropharynx is  clear.  Eyes:     Extraocular Movements: Extraocular movements intact.     Pupils: Pupils are equal, round, and reactive to light.  Cardiovascular:     Rate and Rhythm: Normal rate and regular rhythm.     Pulses: Normal pulses.     Heart sounds: Normal heart sounds.  Pulmonary:     Effort: Pulmonary effort is normal. No respiratory distress.     Breath sounds: Normal breath sounds.  Abdominal:     General: Bowel sounds are normal. There is no distension.     Palpations: Abdomen is soft.     Tenderness: There is no abdominal tenderness.  Musculoskeletal:        General: No swelling or deformity.  Skin:    General: Skin is warm and dry.  Neurological:     General: No focal deficit present.     Mental Status: Mental status is at baseline.    Labs on Admission: I have personally reviewed following labs and imaging studies  CBC: Recent Labs  Lab 10/21/21 0715  WBC 11.8*  HGB 15.1  HCT 46.5  MCV 94.9  PLT 186    Basic Metabolic Panel: Recent Labs  Lab 10/21/21 0715  NA 143  K 3.7  CL 108  CO2 25  GLUCOSE 144*  BUN 16  CREATININE 1.34*  CALCIUM 9.1    GFR: Estimated Creatinine Clearance: 39.3 mL/min (A) (by C-G formula based on SCr of 1.34 mg/dL (H)).  Liver Function Tests: No results for input(s): "AST", "ALT", "ALKPHOS", "BILITOT", "PROT", "ALBUMIN" in the last 168 hours.  Urine analysis:    Component Value Date/Time   LABSPEC 1.015 05/12/2018 1154   PHURINE 8.5 (H) 05/12/2018 1154   GLUCOSEU NEGATIVE 05/12/2018 1154   HGBUR NEGATIVE 05/12/2018 1154   BILIRUBINUR NEGATIVE 05/12/2018 1154   BILIRUBINUR negative 11/11/2016 1037   BILIRUBINUR neg 11/28/2013 0929   KETONESUR NEGATIVE 05/12/2018 1154   PROTEINUR NEGATIVE 05/12/2018 1154   UROBILINOGEN 0.2 05/12/2018 1154   NITRITE NEGATIVE 05/12/2018 1154   LEUKOCYTESUR NEGATIVE 05/12/2018 1154    Radiological Exams on Admission: CT Angio Chest Pulmonary Embolism (PE) W or WO Contrast  Result Date:  10/21/2021 CLINICAL DATA:  Pulmonary embolism (PE) suspected, high prob Left-sided chest pain. EXAM: CT ANGIOGRAPHY CHEST WITH CONTRAST TECHNIQUE: Multidetector CT imaging of the chest was performed using the standard protocol during bolus administration of intravenous contrast. Multiplanar CT image reconstructions and MIPs were obtained to evaluate the vascular anatomy. RADIATION DOSE REDUCTION: This exam was performed according to the departmental dose-optimization program which includes automated exposure control, adjustment of the mA and/or kV according to patient size and/or use of iterative reconstruction technique. CONTRAST:  63mL OMNIPAQUE IOHEXOL 350 MG/ML SOLN COMPARISON:  Radiograph earlier today no prior chest CT. FINDINGS: Cardiovascular: Examination is positive for bilateral pulmonary emboli.  There are filling defects within the segmental branches to both upper lobes. There is lobar involvement of the right middle lobe. Probable subsegmental involvement in the right lower lobe, although obscured by motion on the current exam. There may be an element of chronic clot as some of the filling defects are a centric and peripheral. Overall clot burden is moderate. Positive for right heart strain with RV to LV ratio of 1.22. the heart is normal in size. Aortic tortuosity with minimal atherosclerosis. No pericardial effusion. Mediastinum/Nodes: No mediastinal adenopathy. No enlarged hilar lymph nodes. Small hiatal hernia. No visualized thyroid nodule. Lungs/Pleura: Moderate emphysema. Mild central bronchial thickening. Perifissural atelectasis in the left upper lobe. Subsegmental atelectasis in the right greater than left lower lobes. No confluent consolidation. No pleural effusion. No pulmonary mass. The trachea and central bronchi are patent. Upper Abdomen: Simple cysts in both kidneys are partially included, needing no further follow-up. Cholecystectomy. Left colonic diverticulosis. No acute upper abdominal  findings. Musculoskeletal: Prominent Schmorl's node with slight loss of height involving T11 superior endplate, appearing chronic. There are no acute or suspicious osseous abnormalities. Review of the MIP images confirms the above findings. IMPRESSION: 1. Positive for acute pulmonary embolus with CT evidence of right heart strain (RV/LV Ratio = 1.22) consistent with at least submassive (intermediate risk) PE. The presence of right heart strain has been associated with an increased risk of morbidity and mortality. Please refer to the "Code PE Focused" order set in EPIC. 2. Emphysema with mild bronchial thickening. Scattered atelectasis in the left upper and both lower lobes. Aortic Atherosclerosis (ICD10-I70.0) and Emphysema (ICD10-J43.9). Critical Value/emergent results were called by telephone at the time of interpretation on 10/21/2021 at 9:51 pm to provider JULIE HAVILAND , who verbally acknowledged these results. Electronically Signed   By: Narda Rutherford M.D.   On: 10/21/2021 21:52   DG Chest 2 View  Result Date: 10/21/2021 CLINICAL DATA:  86 year old male with history of left shoulder and left upper chest pain. EXAM: CHEST - 2 VIEW COMPARISON:  Chest x-ray 10/02/2015. FINDINGS: Lung volumes are normal. No consolidative airspace disease. No pleural effusions. No pneumothorax. No pulmonary nodule or mass noted. Pulmonary vasculature and the cardiomediastinal silhouette are within normal limits. Atherosclerosis in the thoracic aorta. IMPRESSION: 1.  No radiographic evidence of acute cardiopulmonary disease. 2. Aortic atherosclerosis. Electronically Signed   By: Trudie Reed M.D.   On: 10/21/2021 07:38    EKG: Independently reviewed.  Sinus rhythm at 94 bpm.  Assessment/Plan Principal Problem:   Acute pulmonary embolus (HCC) Active Problems:   Essential hypertension   Hyperlipidemia   Gout   Type 2 diabetes mellitus without complications (HCC)   Acute pulmonary embolus > Presenting with  chest pain but no shortness of breath.  Chest pain discovered to be pleuritic in nature D-dimer noted to be elevated at 10.9 and CT PE study performed. > CT showed positive pulmonary embolus with right heart strain and RV to LV ratio 1.22 consistent with at least submassive pulmonary embolus. > No surgeries, extended travel, nor other provoking factors noted > Heparin ordered in the ED. troponin negative x3.  Checking BMP. - Monitor on progressive unit - Continue with heparin, may be able to transition to oral anticoagulation tomorrow - Echocardiogram - Follow-up BNP  Gout - Continue allopurinol  Hypertension - Continue home losartan, metoprolol   Hyperlipidemia - Continue home simvastatin  DVT prophylaxis: Heparin Code Status:   DNR Family Communication:  None on admission, patient states he has spoken  with his brother and daughter. Disposition Plan:   Patient is from:  Home  Anticipated DC to:  Home  Anticipated DC date:  1 to 2 days  Anticipated DC barriers: None  Consults called:  None Admission status:  Observation, progressive  Severity of Illness: The appropriate patient status for this patient is OBSERVATION. Observation status is judged to be reasonable and necessary in order to provide the required intensity of service to ensure the patient's safety. The patient's presenting symptoms, physical exam findings, and initial radiographic and laboratory data in the context of their medical condition is felt to place them at decreased risk for further clinical deterioration. Furthermore, it is anticipated that the patient will be medically stable for discharge from the hospital within 2 midnights of admission.    Synetta Fail MD [facility]  How to contact the [facility] Attending or Consulting provider 7A - 7P or covering provider during after hours 7P -7A, for this patient?   Check the care team in [facility] and look for a) attending/consulting [facility] provider listed and b)  the [facility] team listed Log into [email or web address] and use [place]'s universal password to access. If you do not have the password, please contact the hospital operator. Locate the [facility] provider you are looking for under [facility] and page to a number that you can be directly reached. If you still have difficulty reaching the provider, please page the [facility] (Director on Call) for the Hospitalists listed on amion for assistance.  10/21/2021, 10:20 PM

## 2021-10-21 NOTE — Progress Notes (Signed)
Pharmacy Consult for heparin gtt  Indication: pulmonary embolus  No Known Allergies  Patient Measurements: Height: 5\' 5"  (165.1 cm) Weight: 77.1 kg (170 lb) IBW/kg (Calculated) : 61.5 Heparin Dosing Weight: 77 kg   Vital Signs: Temp: 98 F (36.7 C) (06/15 1412) Temp Source: Oral (06/15 1412) BP: 141/98 (06/15 2045) Pulse Rate: 85 (06/15 2045)  Labs: Recent Labs    10/21/21 0715  HGB 15.1  HCT 46.5  PLT 186  CREATININE 1.34*    Estimated Creatinine Clearance: 39.3 mL/min (A) (by C-G formula based on SCr of 1.34 mg/dL (H)).  Assessment: Louis Bryan a 86 y.o. male presented with PE. Pharmacy has been consulted for heparin dosing.   Anticoagulation PTA: Patient not on anticoagulation PTA.  Goal of Therapy:  Heparin level 0.3-0.7 units/ml Monitor platelets by anticoagulation protocol: Yes   Plan:  Heparin bolus via infusion 4,000 Units x1  Start heparin infusion at 1,250 units/hour Check heparin level in 8 hours and daily while on heparin, daily CBC  Monitor for signs and symptoms of bleeding    97, PharmD PGY-1 Acute Care Resident  10/21/2021 10:05 PM

## 2021-10-21 NOTE — ED Provider Triage Note (Signed)
Emergency Medicine Provider Triage Evaluation Note  Louis Bryan , a 86 y.o. male  was evaluated in triage.  Pt complains of left shoulder pain, left chest pain upon waking this morning.  States that he has been having intermittent sweats for the past few weeks, woke up this morning with a discomfort in his left chest that is worse with movement of his left shoulder.  He denies associated shortness of breath, nausea.  Review of Systems  Positive: Chest pain, shoulder pain Negative: Shortness of breath  Physical Exam  BP 132/85 (BP Location: Right Arm)   Pulse 88   Temp 98.8 F (37.1 C) (Oral)   Resp 18   Ht 5\' 5"  (1.651 m)   Wt 77.1 kg   SpO2 93%   BMI 28.29 kg/m  Gen:   Awake, no distress   Resp:  Normal effort  MSK:   Moves extremities without difficulty  Other:  Pain reproduced with movement of the left shoulder, no palpable chest wall pain.  Medical Decision Making  Medically screening exam initiated at 7:16 AM.  Appropriate orders placed.  Louis Bryan was informed that the remainder of the evaluation will be completed by another provider, this initial triage assessment does not replace that evaluation, and the importance of remaining in the ED until their evaluation is complete.     Louis Li, PA-C 10/21/21 [phone]

## 2021-10-22 ENCOUNTER — Other Ambulatory Visit (HOSPITAL_COMMUNITY): Payer: Self-pay

## 2021-10-22 ENCOUNTER — Observation Stay (HOSPITAL_COMMUNITY): Payer: Medicare Other

## 2021-10-22 DIAGNOSIS — I2602 Saddle embolus of pulmonary artery with acute cor pulmonale: Secondary | ICD-10-CM

## 2021-10-22 DIAGNOSIS — Z7982 Long term (current) use of aspirin: Secondary | ICD-10-CM | POA: Diagnosis not present

## 2021-10-22 DIAGNOSIS — M109 Gout, unspecified: Secondary | ICD-10-CM | POA: Diagnosis present

## 2021-10-22 DIAGNOSIS — Z79899 Other long term (current) drug therapy: Secondary | ICD-10-CM | POA: Diagnosis not present

## 2021-10-22 DIAGNOSIS — I2699 Other pulmonary embolism without acute cor pulmonale: Secondary | ICD-10-CM | POA: Diagnosis present

## 2021-10-22 DIAGNOSIS — Z87891 Personal history of nicotine dependence: Secondary | ICD-10-CM | POA: Diagnosis not present

## 2021-10-22 DIAGNOSIS — E119 Type 2 diabetes mellitus without complications: Secondary | ICD-10-CM | POA: Diagnosis present

## 2021-10-22 DIAGNOSIS — Z833 Family history of diabetes mellitus: Secondary | ICD-10-CM | POA: Diagnosis not present

## 2021-10-22 DIAGNOSIS — D72829 Elevated white blood cell count, unspecified: Secondary | ICD-10-CM | POA: Diagnosis present

## 2021-10-22 DIAGNOSIS — I10 Essential (primary) hypertension: Secondary | ICD-10-CM | POA: Diagnosis present

## 2021-10-22 DIAGNOSIS — Z66 Do not resuscitate: Secondary | ICD-10-CM | POA: Diagnosis present

## 2021-10-22 DIAGNOSIS — E785 Hyperlipidemia, unspecified: Secondary | ICD-10-CM | POA: Diagnosis present

## 2021-10-22 HISTORY — PX: TRANSTHORACIC ECHOCARDIOGRAM: SHX275

## 2021-10-22 LAB — COMPREHENSIVE METABOLIC PANEL
ALT: 12 U/L (ref 0–44)
AST: 11 U/L — ABNORMAL LOW (ref 15–41)
Albumin: 3 g/dL — ABNORMAL LOW (ref 3.5–5.0)
Alkaline Phosphatase: 93 U/L (ref 38–126)
Anion gap: 9 (ref 5–15)
BUN: 18 mg/dL (ref 8–23)
CO2: 26 mmol/L (ref 22–32)
Calcium: 8.5 mg/dL — ABNORMAL LOW (ref 8.9–10.3)
Chloride: 108 mmol/L (ref 98–111)
Creatinine, Ser: 1.38 mg/dL — ABNORMAL HIGH (ref 0.61–1.24)
GFR, Estimated: 50 mL/min — ABNORMAL LOW (ref >60)
Glucose, Bld: 106 mg/dL — ABNORMAL HIGH (ref 70–99)
Potassium: 3.5 mmol/L (ref 3.5–5.1)
Sodium: 143 mmol/L (ref 135–145)
Total Bilirubin: 1.6 mg/dL — ABNORMAL HIGH (ref 0.3–1.2)
Total Protein: 6.3 g/dL — ABNORMAL LOW (ref 6.5–8.1)

## 2021-10-22 LAB — ECHOCARDIOGRAM COMPLETE
Area-P 1/2: 1.99 cm2
Height: 65 in
S' Lateral: 2.9 cm
Weight: 2720 oz

## 2021-10-22 LAB — CBC
HCT: 42.9 % (ref 39.0–52.0)
Hemoglobin: 13.9 g/dL (ref 13.0–17.0)
MCH: 31.1 pg (ref 26.0–34.0)
MCHC: 32.4 g/dL (ref 30.0–36.0)
MCV: 96 fL (ref 80.0–100.0)
Platelets: 166 10*3/uL (ref 150–400)
RBC: 4.47 MIL/uL (ref 4.22–5.81)
RDW: 13.9 % (ref 11.5–15.5)
WBC: 7.4 10*3/uL (ref 4.0–10.5)
nRBC: 0 % (ref 0.0–0.2)

## 2021-10-22 LAB — HEPARIN LEVEL (UNFRACTIONATED)
Heparin Unfractionated: 0.86 IU/mL — ABNORMAL HIGH (ref 0.30–0.70)
Heparin Unfractionated: 0.71 IU/mL — ABNORMAL HIGH (ref 0.30–0.70)

## 2021-10-22 LAB — BRAIN NATRIURETIC PEPTIDE: B Natriuretic Peptide: 54.3 pg/mL (ref 0.0–100.0)

## 2021-10-22 MED ORDER — APIXABAN 5 MG PO TABS
10.0000 mg | ORAL_TABLET | Freq: Two times a day (BID) | ORAL | Status: DC
Start: 2021-10-22 — End: 2021-10-23
  Administered 2021-10-22 – 2021-10-23 (×2): 10 mg via ORAL
  Filled 2021-10-22 (×2): qty 2

## 2021-10-22 MED ORDER — APIXABAN 5 MG PO TABS: 5.0000 mg | ORAL_TABLET | Freq: Two times a day (BID) | ORAL | Status: DC

## 2021-10-22 MED ORDER — APIXABAN (ELIQUIS) VTE STARTER PACK (10MG AND 5MG)
ORAL_TABLET | ORAL | 0 refills | Status: DC
  Filled 2021-10-22: qty 74, 30d supply, fill #0

## 2021-10-22 NOTE — Progress Notes (Signed)
Patient admitted to 4e-01, vitals taken, chg complete, patient has no needs at this time

## 2021-10-22 NOTE — Progress Notes (Signed)
ANTICOAGULATION CONSULT NOTE  Pharmacy Consult for Heparin Indication: pulmonary embolus Brief A/P: Heparin level supratherapeutic Decrease Heparin rate  No Known Allergies  Patient Measurements: Height: 5\' 5"  (165.1 cm) Weight: 77.1 kg (170 lb) IBW/kg (Calculated) : 61.5  Vital Signs: BP: 122/96 (06/16 0400) Pulse Rate: 79 (06/16 0400)  Labs: Recent Labs    10/21/21 0715 10/21/21 1056 10/21/21 1710 10/21/21 2045 10/22/21 0559  HGB 15.1  --   --   --  13.9  HCT 46.5  --   --   --  42.9  PLT 186  --   --   --  166  HEPARINUNFRC  --   --   --   --  0.86*  CREATININE 1.34*  --   --   --  1.38*  TROPONINIHS 5 4 4 5   --     Estimated Creatinine Clearance: 38.2 mL/min (A) (by C-G formula based on SCr of 1.38 mg/dL (H)).   Assessment: 86 y.o. male with PE for heparin Goal of Therapy:  Heparin level 0.3-0.7 units/ml Monitor platelets by anticoagulation protocol: Yes   Plan:  Decrease Heparin 1150 units/hr Check heparin level in 8 hours.   10/22/2021,6:49 AM

## 2021-10-22 NOTE — Assessment & Plan Note (Signed)
HCTZ, metoprolol, losartan

## 2021-10-22 NOTE — Progress Notes (Signed)
PROGRESS NOTE    Louis Bryan  ZHG:[id number] DOB: 1935/12/11 DOA: 10/21/2021 PCP: Patient, No Pcp Per  Chief Complaint  Patient presents with   Chest Pain    Brief Narrative:  Louis Bryan is Louis Bryan 86 y.o. male with medical history significant of hypertension, hyperlipidemia, gout, diabetes, glaucoma presenting with chest pain.  Found to have CT findings concerning for submassive PE.  Started on anticoagulation.  See below for additional details      Assessment & Plan:   Principal Problem:   Acute pulmonary embolus (HCC) Active Problems:   Essential hypertension   Gout   Hyperlipidemia   Pulmonary embolism (HCC)   Assessment and Plan: * Acute pulmonary embolus (HCC) CT PE protocol with RH strain, mild bronchial thickening echo with normal RVSF, normal RV size, normal PASP - normal EF Negative LE [place] pesi class 3, due to age/sex -> given concerning CT scan and intermediate pesi score, will watch another day prior to discharge Will transition to eliquis  hypercoag panel ordered No clear provoking event (denies hx cancer, recent travel, trauma, periods of immobility).  Needs at least 3-6 months anticoagulation, after this would reevaluate risks and need for continued anticoagulation.  Essential hypertension HCTZ, metoprolol, losartan  Gout allopurinol  Hyperlipidemia simvastatin     DVT prophylaxis: eliquis Code Status: DNR Family Communication: none Disposition:   Status is: Inpatient Remains inpatient appropriate because: intermediate pesi score, advanced age, evidence RH strain on CT, will monitor on anticoagulation prior to discharge   Consultants:  none  Procedures:  LE [place] Summary:  RIGHT:  - There is no evidence of deep vein thrombosis in the lower extremity.     - No cystic structure found in the popliteal fossa.     LEFT:  - There is no evidence of deep vein thrombosis in the lower extremity.     - No cystic structure found in the popliteal fossa.    Echo IMPRESSIONS     1. Left ventricular ejection fraction, by estimation, is 60 to 65%. The  left ventricle has normal function. The left ventricle has no regional  wall motion abnormalities. Left ventricular diastolic parameters are  consistent with Grade I diastolic  dysfunction (impaired relaxation).   2. Right ventricular systolic function is normal. The right ventricular  size is normal. There is normal pulmonary artery systolic pressure.   3. The mitral valve is normal in structure. No evidence of mitral valve  regurgitation. No evidence of mitral stenosis.   4. The aortic valve is tricuspid. There is mild calcification of the  aortic valve. Aortic valve regurgitation is not visualized. Aortic valve  sclerosis is present, with no evidence of aortic valve stenosis.   5. The inferior vena cava is normal in size with greater than 50%  respiratory variability, suggesting right atrial pressure of 3 mmHg.   Antimicrobials:  Anti-infectives (From admission, onward)    None       Subjective: Still some SOB  Objective: Vitals:   10/22/21 1241 10/22/21 1314 10/22/21 1344 10/22/21 1630  BP:  118/78 138/86 131/80  Pulse:  75 81 65  Resp:  12 20 20   Temp: 98.1 F (36.7 C) 98 F (36.7 C) 98 F (36.7 C) 97.7 F (36.5 C)  TempSrc: Oral Oral Oral Oral  SpO2:  98% 99% 98%  Weight:      Height:       No intake or output data in the 24 hours ending 10/22/21 1733 10/24/21  10/21/21 0714  Weight: 77.1 kg    Examination:  General exam: Appears calm and comfortable  Respiratory system: Clear to auscultation. Respiratory effort normal. Cardiovascular system: S1 & S2 heard, RRR Gastrointestinal system: Abdomen is nondistended, soft and nontender Central nervous system: Alert and oriented. No focal neurological deficits. Extremities: no LEE  Data Reviewed: I have personally reviewed following labs and imaging studies  CBC: Recent Labs  Lab 10/21/21 0715  10/22/21 0559  WBC 11.8* 7.4  HGB 15.1 13.9  HCT 46.5 42.9  MCV 94.9 96.0  PLT 186 166    Basic Metabolic Panel: Recent Labs  Lab 10/21/21 0715 10/22/21 0559  NA 143 143  K 3.7 3.5  CL 108 108  CO2 25 26  GLUCOSE 144* 106*  BUN 16 18  CREATININE 1.34* 1.38*  CALCIUM 9.1 8.5*    GFR: Estimated Creatinine Clearance: 38.2 mL/min (Roxine Whittinghill) (by C-G formula based on SCr of 1.38 mg/dL (H)).  Liver Function Tests: Recent Labs  Lab 10/22/21 0559  AST 11*  ALT 12  ALKPHOS 93  BILITOT 1.6*  PROT 6.3*  ALBUMIN 3.0*    CBG: Recent Labs  Lab 10/21/21 0718  GLUCAP 142*     No results found for this or any previous visit (from the past 240 hour(s)).       Radiology Studies: ECHOCARDIOGRAM COMPLETE  Result Date: 10/22/2021    ECHOCARDIOGRAM REPORT   Patient Name:   Louis Bryan Date of Exam: 10/22/2021 Medical Rec #:  [id number]  Height:       65.0 in Accession #:    [id number] Weight:       170.0 lb Date of Birth:  13-Jan-1936   BSA:          1.846 m Patient Age:    86 years   BP:           122/96 mmHg Patient Gender: M          HR:           96 bpm. Exam Location:  Inpatient Procedure: 2D Echo, Color Doppler and Cardiac Doppler Indications:    I26.02 Pulmonary embolus  History:        Patient has no prior history of Echocardiogram examinations.                 Risk Factors:Hypertension, Diabetes and Dyslipidemia.  Sonographer:    Irving Burton Senior RDCS Referring Phys: [id number] Cecille Po MELVIN  Sonographer Comments: Suboptimal parasternal window due to lung interference. IMPRESSIONS  1. Left ventricular ejection fraction, by estimation, is 60 to 65%. The left ventricle has normal function. The left ventricle has no regional wall motion abnormalities. Left ventricular diastolic parameters are consistent with Grade I diastolic dysfunction (impaired relaxation).  2. Right ventricular systolic function is normal. The right ventricular size is normal. There is normal pulmonary artery systolic  pressure.  3. The mitral valve is normal in structure. No evidence of mitral valve regurgitation. No evidence of mitral stenosis.  4. The aortic valve is tricuspid. There is mild calcification of the aortic valve. Aortic valve regurgitation is not visualized. Aortic valve sclerosis is present, with no evidence of aortic valve stenosis.  5. The inferior vena cava is normal in size with greater than 50% respiratory variability, suggesting right atrial pressure of 3 mmHg. FINDINGS  Left Ventricle: Left ventricular ejection fraction, by estimation, is 60 to 65%. The left ventricle has normal function. The left ventricle has no regional wall motion abnormalities. The left  ventricular internal cavity size was normal in size. There is  no left ventricular hypertrophy. Left ventricular diastolic parameters are consistent with Grade I diastolic dysfunction (impaired relaxation). Right Ventricle: The right ventricular size is normal. No increase in right ventricular wall thickness. Right ventricular systolic function is normal. There is normal pulmonary artery systolic pressure. The tricuspid regurgitant velocity is 2.46 m/s, and  with an assumed right atrial pressure of 3 mmHg, the estimated right ventricular systolic pressure is 27.2 mmHg. Left Atrium: Left atrial size was normal in size. Right Atrium: Right atrial size was normal in size. Pericardium: There is no evidence of pericardial effusion. Mitral Valve: The mitral valve is normal in structure. No evidence of mitral valve regurgitation. No evidence of mitral valve stenosis. Tricuspid Valve: The tricuspid valve is normal in structure. Tricuspid valve regurgitation is not demonstrated. No evidence of tricuspid stenosis. Aortic Valve: The aortic valve is tricuspid. There is mild calcification of the aortic valve. Aortic valve regurgitation is not visualized. Aortic valve sclerosis is present, with no evidence of aortic valve stenosis. Pulmonic Valve: The pulmonic valve  was normal in structure. Pulmonic valve regurgitation is trivial. No evidence of pulmonic stenosis. Aorta: The aortic root is normal in size and structure. Venous: The inferior vena cava is normal in size with greater than 50% respiratory variability, suggesting right atrial pressure of 3 mmHg. IAS/Shunts: No atrial level shunt detected by color flow Doppler.  LEFT VENTRICLE PLAX 2D LVIDd:         3.90 cm   Diastology LVIDs:         2.90 cm   LV e' medial:    5.66 cm/s LV PW:         0.90 cm   LV E/e' medial:  8.6 LV IVS:        0.80 cm   LV e' lateral:   7.29 cm/s LVOT diam:     2.20 cm   LV E/e' lateral: 6.7 LV SV:         90 LV SV Index:   49 LVOT Area:     3.80 cm  RIGHT VENTRICLE RV S prime:     12.60 cm/s TAPSE (M-mode): 2.1 cm LEFT ATRIUM             Index        RIGHT ATRIUM           Index LA diam:        2.80 cm 1.52 cm/m   RA Area:     13.80 cm LA Vol (A2C):   50.0 ml 27.08 ml/m  RA Volume:   31.50 ml  17.06 ml/m LA Vol (A4C):   31.9 ml 17.28 ml/m LA Biplane Vol: 43.0 ml 23.29 ml/m  AORTIC VALVE LVOT Vmax:   114.00 cm/s LVOT Vmean:  75.300 cm/s LVOT VTI:    0.238 m  AORTA Ao Root diam: 3.50 cm MITRAL VALVE               TRICUSPID VALVE MV Area (PHT): 1.99 cm    TR Peak grad:   24.2 mmHg MV Decel Time: 381 msec    TR Vmax:        246.00 cm/s MV E velocity: 48.80 cm/s MV Amelya Mabry velocity: 77.10 cm/s  SHUNTS MV E/Quashaun Lazalde ratio:  0.63        Systemic VTI:  0.24 m  Systemic Diam: 2.20 cm Charlton Haws MD Electronically signed by Charlton Haws MD Signature Date/Time: 10/22/2021/11:33:34 AM    Final    VAS [place] LOWER EXTREMITY VENOUS (DVT)  Result Date: 10/22/2021  Lower Venous DVT Study Patient Name:  Louis Bryan  Date of Exam:   10/22/2021 Medical Rec #: [id number]   Accession #:    [id number] Date of Birth: 1936-04-23    Patient Gender: M Patient Age:   7 years Exam Location:  [facility] Procedure:      VAS [place] LOWER EXTREMITY VENOUS (DVT) Referring Phys: Eathel Pajak POWELL JR  --------------------------------------------------------------------------------  Indications: Pulmonary embolism.  Comparison Study: No prior studies. Performing Technologist: Jean Rosenthal RDMS, RVT  Examination Guidelines: Eyanna Mcgonagle complete evaluation includes B-mode imaging, spectral Doppler, color Doppler, and power Doppler as needed of all accessible portions of each vessel. Bilateral testing is considered an integral part of Tai Syfert complete examination. Limited examinations for reoccurring indications may be performed as noted. The reflux portion of the exam is performed with the patient in reverse Trendelenburg.  +---------+---------------+---------+-----------+----------+--------------+ RIGHT    CompressibilityPhasicitySpontaneityPropertiesThrombus Aging +---------+---------------+---------+-----------+----------+--------------+ CFV      Full           Yes      Yes                                 +---------+---------------+---------+-----------+----------+--------------+ SFJ      Full                                                        +---------+---------------+---------+-----------+----------+--------------+ FV Prox  Full                                                        +---------+---------------+---------+-----------+----------+--------------+ FV Mid   Full                                                        +---------+---------------+---------+-----------+----------+--------------+ FV DistalFull                                                        +---------+---------------+---------+-----------+----------+--------------+ PFV      Full                                                        +---------+---------------+---------+-----------+----------+--------------+ POP      Full           Yes      Yes                                 +---------+---------------+---------+-----------+----------+--------------+  PTV      Full                                                         +---------+---------------+---------+-----------+----------+--------------+ PERO     Full                                                        +---------+---------------+---------+-----------+----------+--------------+ Gastroc  Full                                                        +---------+---------------+---------+-----------+----------+--------------+   +---------+---------------+---------+-----------+----------+--------------+ LEFT     CompressibilityPhasicitySpontaneityPropertiesThrombus Aging +---------+---------------+---------+-----------+----------+--------------+ CFV      Full           Yes      Yes                                 +---------+---------------+---------+-----------+----------+--------------+ SFJ      Full                                                        +---------+---------------+---------+-----------+----------+--------------+ FV Prox  Full                                                        +---------+---------------+---------+-----------+----------+--------------+ FV Mid   Full                                                        +---------+---------------+---------+-----------+----------+--------------+ FV DistalFull                                                        +---------+---------------+---------+-----------+----------+--------------+ PFV      Full                                                        +---------+---------------+---------+-----------+----------+--------------+ POP      Full           Yes      Yes                                 +---------+---------------+---------+-----------+----------+--------------+  PTV      Full                                                        +---------+---------------+---------+-----------+----------+--------------+ PERO     Full                                                         +---------+---------------+---------+-----------+----------+--------------+ Gastroc  Full                                                        +---------+---------------+---------+-----------+----------+--------------+     Summary: RIGHT: - There is no evidence of deep vein thrombosis in the lower extremity.  - No cystic structure found in the popliteal fossa.  LEFT: - There is no evidence of deep vein thrombosis in the lower extremity.  - No cystic structure found in the popliteal fossa.  *See table(s) above for measurements and observations.    Preliminary    CT Angio Chest Pulmonary Embolism (PE) W or WO Contrast  Result Date: 10/21/2021 CLINICAL DATA:  Pulmonary embolism (PE) suspected, high prob Left-sided chest pain. EXAM: CT ANGIOGRAPHY CHEST WITH CONTRAST TECHNIQUE: Multidetector CT imaging of the chest was performed using the standard protocol during bolus administration of intravenous contrast. Multiplanar CT image reconstructions and MIPs were obtained to evaluate the vascular anatomy. RADIATION DOSE REDUCTION: This exam was performed according to the departmental dose-optimization program which includes automated exposure control, adjustment of the mA and/or kV according to patient size and/or use of iterative reconstruction technique. CONTRAST:  66mL OMNIPAQUE IOHEXOL 350 MG/ML SOLN COMPARISON:  Radiograph earlier today no prior chest CT. FINDINGS: Cardiovascular: Examination is positive for bilateral pulmonary emboli. There are filling defects within the segmental branches to both upper lobes. There is lobar involvement of the right middle lobe. Probable subsegmental involvement in the right lower lobe, although obscured by motion on the current exam. There may be an element of chronic clot as some of the filling defects are Zamzam Whinery centric and peripheral. Overall clot burden is moderate. Positive for right heart strain with RV to LV ratio of 1.22. the heart is normal in size. Aortic tortuosity  with minimal atherosclerosis. No pericardial effusion. Mediastinum/Nodes: No mediastinal adenopathy. No enlarged hilar lymph nodes. Small hiatal hernia. No visualized thyroid nodule. Lungs/Pleura: Moderate emphysema. Mild central bronchial thickening. Perifissural atelectasis in the left upper lobe. Subsegmental atelectasis in the right greater than left lower lobes. No confluent consolidation. No pleural effusion. No pulmonary mass. The trachea and central bronchi are patent. Upper Abdomen: Simple cysts in both kidneys are partially included, needing no further follow-up. Cholecystectomy. Left colonic diverticulosis. No acute upper abdominal findings. Musculoskeletal: Prominent Schmorl's node with slight loss of height involving T11 superior endplate, appearing chronic. There are no acute or suspicious osseous abnormalities. Review of the MIP images confirms the above findings. IMPRESSION: 1. Positive for acute pulmonary embolus with CT evidence of right heart strain (RV/LV Ratio = 1.22) consistent with at least submassive (  intermediate risk) PE. The presence of right heart strain has been associated with an increased risk of morbidity and mortality. Please refer to the "Code PE Focused" order set in EPIC. 2. Emphysema with mild bronchial thickening. Scattered atelectasis in the left upper and both lower lobes. Aortic Atherosclerosis (ICD10-I70.0) and Emphysema (ICD10-J43.9). Critical Value/emergent results were called by telephone at the time of interpretation on 10/21/2021 at 9:51 pm to provider JULIE HAVILAND , who verbally acknowledged these results. Electronically Signed   By: Narda RutherfordMelanie  Sanford M.D.   On: 10/21/2021 21:52   DG Chest 2 View  Result Date: 10/21/2021 CLINICAL DATA:  86 year old male with history of left shoulder and left upper chest pain. EXAM: CHEST - 2 VIEW COMPARISON:  Chest x-ray 10/02/2015. FINDINGS: Lung volumes are normal. No consolidative airspace disease. No pleural effusions. No  pneumothorax. No pulmonary nodule or mass noted. Pulmonary vasculature and the cardiomediastinal silhouette are within normal limits. Atherosclerosis in the thoracic aorta. IMPRESSION: 1.  No radiographic evidence of acute cardiopulmonary disease. 2. Aortic atherosclerosis. Electronically Signed   By: Trudie Reedaniel  Entrikin M.D.   On: 10/21/2021 07:38        Scheduled Meds:  allopurinol  100 mg Oral Daily   apixaban  10 mg Oral BID   Followed by   Melene Muller[START ON 10/29/2021] apixaban  5 mg Oral BID   hydrochlorothiazide  12.5 mg Oral Daily   losartan  50 mg Oral Daily   metoprolol succinate  25 mg Oral Daily   simvastatin  40 mg Oral QHS   sodium chloride flush  3 mL Intravenous Q12H   Continuous Infusions:   LOS: 0 days    Time spent: over 30 min    Lacretia Nicksaldwell Powell, MD [facility]   To contact the attending provider between 7A-7P or the covering provider during after hours 7P-7A, please log into the web site [email or web address] and access using universal [place] password for that web site. If you do not have the password, please call the hospital operator.  10/22/2021, 5:33 PM

## 2021-10-22 NOTE — Assessment & Plan Note (Signed)
allopurinol

## 2021-10-22 NOTE — Progress Notes (Signed)
Echocardiogram 2D Echocardiogram has been performed.  Warren Lacy Janiyha Montufar RDCS 10/22/2021, 9:41 AM

## 2021-10-22 NOTE — ED Notes (Signed)
Patient transported to ECHO.

## 2021-10-22 NOTE — Progress Notes (Signed)
ANTICOAGULATION CONSULT NOTE  Pharmacy Consult for Heparin>>apixaban Indication: pulmonary embolus Brief A/P: Heparin level supratherapeutic Decrease Heparin rate  No Known Allergies  Patient Measurements: Height: 5\' 5"  (165.1 cm) Weight: 77.1 kg (170 lb) IBW/kg (Calculated) : 61.5  Vital Signs: Temp: 98 F (36.7 C) (06/16 1344) Temp Source: Oral (06/16 1344) BP: 138/86 (06/16 1344) Pulse Rate: 81 (06/16 1344)  Labs: Recent Labs    10/21/21 0715 10/21/21 1056 10/21/21 1710 10/21/21 2045 10/22/21 0559 10/22/21 1417  HGB 15.1  --   --   --  13.9  --   HCT 46.5  --   --   --  42.9  --   PLT 186  --   --   --  166  --   HEPARINUNFRC  --   --   --   --  0.86* 0.71*  CREATININE 1.34*  --   --   --  1.38*  --   TROPONINIHS 5 4 4 5   --   --      Estimated Creatinine Clearance: 38.2 mL/min (A) (by C-G formula based on SCr of 1.38 mg/dL (H)).   Assessment: 86 y.o. male with PE for heparin. Ok to transition to PO apixaban today per Dr. .   Goal of Therapy:  Monitor platelets by anticoagulation protocol: Yes   Plan:  Dc heparin Apixaban 10mg  PO BID x14 doses then 5mg  PO BID Pharmacy will follow peripherally and educate  97, PharmD, BCIDP, AAHIVP, CPP Infectious Disease Pharmacist 10/22/2021 3:53 PM

## 2021-10-22 NOTE — Hospital Course (Signed)
Louis Bryan is Louis Bryan 86 y.o. male with medical history significant of hypertension, hyperlipidemia, gout, diabetes, glaucoma presenting with chest pain.  Found to have CT findings concerning for submassive PE.  Started on anticoagulation.  See below for additional details

## 2021-10-22 NOTE — Progress Notes (Signed)
Lower extremity venous bilateral study completed.   Please see CV Proc for preliminary results.   Leroy Pettway, RDMS, RVT  

## 2021-10-22 NOTE — Assessment & Plan Note (Signed)
simvastatin 

## 2021-10-22 NOTE — Assessment & Plan Note (Signed)
CT PE protocol with RH strain, mild bronchial thickening echo with normal RVSF, normal RV size, normal PASP - normal EF Negative LE [place] pesi class 3, due to age/sex -> given concerning CT scan and intermediate pesi score, will watch another day prior to discharge Will transition to eliquis  hypercoag panel ordered No clear provoking event (denies hx cancer, recent travel, trauma, periods of immobility).  Needs at least 3-6 months anticoagulation, after this would reevaluate risks and need for continued anticoagulation.

## 2021-10-23 LAB — CBC
HCT: 39 % (ref 39.0–52.0)
Hemoglobin: 13.6 g/dL (ref 13.0–17.0)
MCH: 32.4 pg (ref 26.0–34.0)
MCHC: 34.9 g/dL (ref 30.0–36.0)
MCV: 92.9 fL (ref 80.0–100.0)
Platelets: 170 10*3/uL (ref 150–400)
RBC: 4.2 MIL/uL — ABNORMAL LOW (ref 4.22–5.81)
RDW: 13.8 % (ref 11.5–15.5)
WBC: 9.3 10*3/uL (ref 4.0–10.5)
nRBC: 0 % (ref 0.0–0.2)

## 2021-10-23 LAB — BASIC METABOLIC PANEL
Anion gap: 8 (ref 5–15)
BUN: 18 mg/dL (ref 8–23)
CO2: 26 mmol/L (ref 22–32)
Calcium: 8.1 mg/dL — ABNORMAL LOW (ref 8.9–10.3)
Chloride: 105 mmol/L (ref 98–111)
Creatinine, Ser: 1.17 mg/dL (ref 0.61–1.24)
GFR, Estimated: >60 mL/min (ref >60)
Glucose, Bld: 107 mg/dL — ABNORMAL HIGH (ref 70–99)
Potassium: 3.9 mmol/L (ref 3.5–5.1)
Sodium: 139 mmol/L (ref 135–145)

## 2021-10-23 LAB — BETA-2-GLYCOPROTEIN I ABS, IGG/M/A
Beta-2 Glyco I IgG: <9 GPI IgG units (ref 0–20)
Beta-2-Glycoprotein I IgA: <9 GPI IgA units (ref 0–25)
Beta-2-Glycoprotein I IgM: <9 GPI IgM units (ref 0–32)

## 2021-10-23 LAB — HOMOCYSTEINE: Homocysteine: 13.8 umol/L (ref 0.0–21.3)

## 2021-10-23 NOTE — Progress Notes (Signed)
SATURATION QUALIFICATIONS: (This note is used to comply with regulatory documentation for home oxygen)  Patient Saturations on Room Air at Rest = 98%  Patient Saturations on Room Air while Ambulating = 93-96%  Patient Saturations on N/A Liters of oxygen while Ambulating = N/A%  Please briefly explain why patient needs home oxygen:Pt does not need home oxygen.   Louis Bryan [facility] PT Acute Rehabilitation Services Office [phone]

## 2021-10-23 NOTE — Discharge Summary (Signed)
Physician Discharge Summary  Louis Bryan QIW:[id number] DOB: Aug 09, 1935 DOA: 10/21/2021  PCP: Patient, No Pcp Per  Admit date: 10/21/2021 Discharge date: 10/23/2021  Time spent: 40 minutes  Recommendations for Outpatient Follow-up:   Follow outpatient CBC/CMP  Anticoagulation x3-6 months, then reevaluate based on risk/benefits Follow pending hypercoagulable studies as below  Consider heme follow up if thought necessary Ensure he's up to date with cancer screening  Discharge Diagnoses:  Principal Problem:   Acute pulmonary embolus (HCC) Active Problems:   Essential hypertension   Gout   Hyperlipidemia   Pulmonary embolism (HCC)   Discharge Condition: stable  Diet recommendation: heart healthy  Filed Weights   10/21/21 0714  Weight: 77.1 kg    History of present illness:  Louis Bryan is Louis Bryan 86 y.o. male with medical history significant of hypertension, hyperlipidemia, gout, diabetes, glaucoma presenting with chest pain.  Found to have CT findings concerning for submassive PE.  Started on anticoagulation.  Doing well on hospital day 2, plan for discharge on eliquis.  See below for additional details    Hospital Course:  Assessment and Plan: * Acute pulmonary embolus (HCC) CT PE protocol with RH strain, mild bronchial thickening echo with normal RVSF, normal RV size, normal PASP - normal EF Negative LE [place] pesi class 3, due to age/sex  Discharge on eliquis hypercoag panel ordered - labs pending at discharge including antithrombin III, protein C activity, protein c total, protein s activity, protein s total, lupus anticoagulant panel, beta 2 glycoprotein I ab, serum homocysteine, factor 5 Leiden, prothrombin gene mutation, cardiolipin antibodies  No clear provoking event (denies hx cancer, recent travel, trauma, periods of immobility).  Needs to review whether cancer screening up to date with PCP.  Needs at least 3-6 months anticoagulation, after this would reevaluate risks  and need for continued anticoagulation.  Essential hypertension HCTZ, metoprolol, losartan  Gout allopurinol  Hyperlipidemia simvastatin      Procedures: Echo IMPRESSIONS     1. Left ventricular ejection fraction, by estimation, is 60 to 65%. The  left ventricle has normal function. The left ventricle has no regional  wall motion abnormalities. Left ventricular diastolic parameters are  consistent with Grade I diastolic  dysfunction (impaired relaxation).   2. Right ventricular systolic function is normal. The right ventricular  size is normal. There is normal pulmonary artery systolic pressure.   3. The mitral valve is normal in structure. No evidence of mitral valve  regurgitation. No evidence of mitral stenosis.   4. The aortic valve is tricuspid. There is mild calcification of the  aortic valve. Aortic valve regurgitation is not visualized. Aortic valve  sclerosis is present, with no evidence of aortic valve stenosis.   5. The inferior vena cava is normal in size with greater than 50%  respiratory variability, suggesting right atrial pressure of 3 mmHg.    Summary:  RIGHT:  - There is no evidence of deep vein thrombosis in the lower extremity.     - No cystic structure found in the popliteal fossa.     LEFT:  - There is no evidence of deep vein thrombosis in the lower extremity.     - No cystic structure found in the popliteal fossa.   Consultations: none  Discharge Exam: Vitals:   10/23/21 0748 10/23/21 1257  BP: 140/85 (!) 137/91  Pulse: 73 79  Resp: 17 20  Temp: 97.9 F (36.6 C) (!) 97.3 F (36.3 C)  SpO2: 94%    No complaints Eager for  discharge home Brother at bedside Discussed d/c instructions  General: No acute distress. Cardiovascular: RRR Lungs: unlabored Abdomen: Soft, nontender, nondistended  Neurological: Alert and oriented 3. Moves all extremities 4 with equal strength. Cranial nerves II through XII grossly intact. Skin: Warm  and dry. No rashes or lesions. Extremities: No clubbing or cyanosis. No edema.  Discharge Instructions   Discharge Instructions     Call MD for:  difficulty breathing, headache or visual disturbances   Complete by: As directed    Call MD for:  extreme fatigue   Complete by: As directed    Call MD for:  hives   Complete by: As directed    Call MD for:  persistant dizziness or light-headedness   Complete by: As directed    Call MD for:  persistant nausea and vomiting   Complete by: As directed    Call MD for:  redness, tenderness, or signs of infection (pain, swelling, redness, odor or green/yellow discharge around incision site)   Complete by: As directed    Call MD for:  severe uncontrolled pain   Complete by: As directed    Call MD for:  temperature >100.4   Complete by: As directed    Diet - low sodium heart healthy   Complete by: As directed    Discharge instructions   Complete by: As directed    You were seen for Louis Bryan blood clot in your lungs.  You've improved on Louis Bryan blood thinner.  Continue your eliquis twice daily.  You'll take the 10 mg twice daily dose for another 6 days and then transition to 5 mg twice daily.  You need to continue eliquis for AT LEAST 3-6 months.   Please arrange follow up with your PCP within Louis Bryan week.  You should get refills of your eliquis from your PCP.  It's extremely important you don't miss doses of your eliquis (this is what is treating your blood clots in your lungs).  After 3-6 months you should have Louis Bryan discussion with your PCP about the risks and benefits of continuing your blood thinner.   It's not clear what caused your blood clot.  You have labs pending at the time of discharge that should be followed with your PCP.  You have labs which are pending, including: antithrombin II, protein C activity, protein C total, protein S activity, protein S total, lupus anticoagulant panel, beta 2 glycoprotein I ab, serum homocysteine, factor 5 leiden, prothrombin  gene mutation, cardiolipin antibodies.  These labs can help determine if you have an increased risk of clotting.  Follow these labs with your PCP outpatient.  You should talk with your PCP about whether your cancer screening is up to date.   Return for new, recurrent, or worsening symptoms.  Please ask your PCP to request records from this hospitalization so they know what was done and what the next steps will be.   Increase activity slowly   Complete by: As directed       Allergies as of 10/23/2021   No Known Allergies      Medication List     STOP taking these medications    aspirin 81 MG tablet       TAKE these medications    acetaminophen 500 MG tablet Commonly known as: TYLENOL Take 500 mg by mouth every 6 (six) hours as needed for moderate pain.   allopurinol 100 MG tablet Commonly known as: ZYLOPRIM Take 1 tablet (100 mg total) by mouth daily.  Eliquis DVT/PE Starter Pack Generic drug: Apixaban Starter Pack (10mg  and 5mg ) Take as directed on package: start with two-5mg  tablets twice daily for 7 days. On day 8, switch to one-5mg  tablet twice daily.   losartan-hydrochlorothiazide 50-12.5 MG tablet Commonly known as: HYZAAR Take 1 tablet by mouth daily.   metoprolol succinate 25 MG 24 hr tablet Commonly known as: TOPROL-XL Take 1 tablet (25 mg total) by mouth daily. with food   simvastatin 40 MG tablet Commonly known as: ZOCOR Take 40 mg by mouth at bedtime.   STOOL SOFTENER PO Take 1 capsule by mouth every evening.       No Known Allergies    The results of significant diagnostics from this hospitalization (including imaging, microbiology, ancillary and laboratory) are listed below for reference.    Significant Diagnostic Studies: VAS LOWER EXTREMITY VENOUS (DVT)  Result Date: 10/23/2021  Lower Venous DVT Study Patient Name:  Torre Klus  Date of Exam:   10/22/2021 Medical Rec #: [id number]   Accession #:    10/24/2021 Date of Birth: [birth year]     Patient Gender: M Patient Age:   86 years Exam Location:  [facility] Procedure:      VAS 97 LOWER EXTREMITY VENOUS (DVT) Referring Phys: Freda Jaquith POWELL JR --------------------------------------------------------------------------------  Indications: Pulmonary embolism.  Comparison Study: No prior studies. Performing Technologist: [facility] RDMS, RVT  Examination Guidelines: Amour Cutrone complete evaluation includes B-mode imaging, spectral Doppler, color Doppler, and power Doppler as needed of all accessible portions of each vessel. Bilateral testing is considered an integral part of Demarkus Remmel complete examination. Limited examinations for reoccurring indications may be performed as noted. The reflux portion of the exam is performed with the patient in reverse Trendelenburg.  +---------+---------------+---------+-----------+----------+--------------+ RIGHT    CompressibilityPhasicitySpontaneityPropertiesThrombus Aging +---------+---------------+---------+-----------+----------+--------------+ CFV      Full           Yes      Yes                                 +---------+---------------+---------+-----------+----------+--------------+ SFJ      Full                                                        +---------+---------------+---------+-----------+----------+--------------+ FV Prox  Full                                                        +---------+---------------+---------+-----------+----------+--------------+ FV Mid   Full                                                        +---------+---------------+---------+-----------+----------+--------------+ FV DistalFull                                                        +---------+---------------+---------+-----------+----------+--------------+  PFV      Full                                                        +---------+---------------+---------+-----------+----------+--------------+ POP      Full           Yes       Yes                                 +---------+---------------+---------+-----------+----------+--------------+ PTV      Full                                                        +---------+---------------+---------+-----------+----------+--------------+ PERO     Full                                                        +---------+---------------+---------+-----------+----------+--------------+ Gastroc  Full                                                        +---------+---------------+---------+-----------+----------+--------------+   +---------+---------------+---------+-----------+----------+--------------+ LEFT     CompressibilityPhasicitySpontaneityPropertiesThrombus Aging +---------+---------------+---------+-----------+----------+--------------+ CFV      Full           Yes      Yes                                 +---------+---------------+---------+-----------+----------+--------------+ SFJ      Full                                                        +---------+---------------+---------+-----------+----------+--------------+ FV Prox  Full                                                        +---------+---------------+---------+-----------+----------+--------------+ FV Mid   Full                                                        +---------+---------------+---------+-----------+----------+--------------+ FV DistalFull                                                        +---------+---------------+---------+-----------+----------+--------------+  PFV      Full                                                        +---------+---------------+---------+-----------+----------+--------------+ POP      Full           Yes      Yes                                 +---------+---------------+---------+-----------+----------+--------------+ PTV      Full                                                         +---------+---------------+---------+-----------+----------+--------------+ PERO     Full                                                        +---------+---------------+---------+-----------+----------+--------------+ Gastroc  Full                                                        +---------+---------------+---------+-----------+----------+--------------+     Summary: RIGHT: - There is no evidence of deep vein thrombosis in the lower extremity.  - No cystic structure found in the popliteal fossa.  LEFT: - There is no evidence of deep vein thrombosis in the lower extremity.  - No cystic structure found in the popliteal fossa.  *See table(s) above for measurements and observations. Electronically signed by Heath Lark on 10/23/2021 at 12:08:23 PM.    Final    ECHOCARDIOGRAM COMPLETE  Result Date: 10/22/2021    ECHOCARDIOGRAM REPORT   Patient Name:   Bob Brueckner Date of Exam: 10/22/2021 Medical Rec #:  [id number]  Height:       65.0 in Accession #:    [id number] Weight:       170.0 lb Date of Birth:  December 13, 1935   BSA:          1.846 m Patient Age:    86 years   BP:           122/96 mmHg Patient Gender: M          HR:           96 bpm. Exam Location:  Inpatient Procedure: 2D Echo, Color Doppler and Cardiac Doppler Indications:    I26.02 Pulmonary embolus  History:        Patient has no prior history of Echocardiogram examinations.                 Risk Factors:Hypertension, Diabetes and Dyslipidemia.  Sonographer:    Irving Burton Senior RDCS Referring Phys: [id number] Cecille Po MELVIN  Sonographer Comments: Suboptimal parasternal window due to lung interference. IMPRESSIONS  1. Left ventricular ejection fraction, by estimation, is 60 to 65%. The left ventricle  has normal function. The left ventricle has no regional wall motion abnormalities. Left ventricular diastolic parameters are consistent with Grade I diastolic dysfunction (impaired relaxation).  2. Right ventricular systolic function is normal. The  right ventricular size is normal. There is normal pulmonary artery systolic pressure.  3. The mitral valve is normal in structure. No evidence of mitral valve regurgitation. No evidence of mitral stenosis.  4. The aortic valve is tricuspid. There is mild calcification of the aortic valve. Aortic valve regurgitation is not visualized. Aortic valve sclerosis is present, with no evidence of aortic valve stenosis.  5. The inferior vena cava is normal in size with greater than 50% respiratory variability, suggesting right atrial pressure of 3 mmHg. FINDINGS  Left Ventricle: Left ventricular ejection fraction, by estimation, is 60 to 65%. The left ventricle has normal function. The left ventricle has no regional wall motion abnormalities. The left ventricular internal cavity size was normal in size. There is  no left ventricular hypertrophy. Left ventricular diastolic parameters are consistent with Grade I diastolic dysfunction (impaired relaxation). Right Ventricle: The right ventricular size is normal. No increase in right ventricular wall thickness. Right ventricular systolic function is normal. There is normal pulmonary artery systolic pressure. The tricuspid regurgitant velocity is 2.46 m/s, and  with an assumed right atrial pressure of 3 mmHg, the estimated right ventricular systolic pressure is 27.2 mmHg. Left Atrium: Left atrial size was normal in size. Right Atrium: Right atrial size was normal in size. Pericardium: There is no evidence of pericardial effusion. Mitral Valve: The mitral valve is normal in structure. No evidence of mitral valve regurgitation. No evidence of mitral valve stenosis. Tricuspid Valve: The tricuspid valve is normal in structure. Tricuspid valve regurgitation is not demonstrated. No evidence of tricuspid stenosis. Aortic Valve: The aortic valve is tricuspid. There is mild calcification of the aortic valve. Aortic valve regurgitation is not visualized. Aortic valve sclerosis is present,  with no evidence of aortic valve stenosis. Pulmonic Valve: The pulmonic valve was normal in structure. Pulmonic valve regurgitation is trivial. No evidence of pulmonic stenosis. Aorta: The aortic root is normal in size and structure. Venous: The inferior vena cava is normal in size with greater than 50% respiratory variability, suggesting right atrial pressure of 3 mmHg. IAS/Shunts: No atrial level shunt detected by color flow Doppler.  LEFT VENTRICLE PLAX 2D LVIDd:         3.90 cm   Diastology LVIDs:         2.90 cm   LV e' medial:    5.66 cm/s LV PW:         0.90 cm   LV E/e' medial:  8.6 LV IVS:        0.80 cm   LV e' lateral:   7.29 cm/s LVOT diam:     2.20 cm   LV E/e' lateral: 6.7 LV SV:         90 LV SV Index:   49 LVOT Area:     3.80 cm  RIGHT VENTRICLE RV S prime:     12.60 cm/s TAPSE (M-mode): 2.1 cm LEFT ATRIUM             Index        RIGHT ATRIUM           Index LA diam:        2.80 cm 1.52 cm/m   RA Area:     13.80 cm LA Vol (A2C):   50.0 ml 27.08 ml/m  RA Volume:   31.50 ml  17.06 ml/m LA Vol (A4C):   31.9 ml 17.28 ml/m LA Biplane Vol: 43.0 ml 23.29 ml/m  AORTIC VALVE LVOT Vmax:   114.00 cm/s LVOT Vmean:  75.300 cm/s LVOT VTI:    0.238 m  AORTA Ao Root diam: 3.50 cm MITRAL VALVE               TRICUSPID VALVE MV Area (PHT): 1.99 cm    TR Peak grad:   24.2 mmHg MV Decel Time: 381 msec    TR Vmax:        246.00 cm/s MV E velocity: 48.80 cm/s MV Ambrosio Reuter velocity: 77.10 cm/s  SHUNTS MV E/Semaje Kinker ratio:  0.63        Systemic VTI:  0.24 m                            Systemic Diam: 2.20 cm Charlton Haws MD Electronically signed by Charlton Haws MD Signature Date/Time: 10/22/2021/11:33:34 AM    Final    CT Angio Chest Pulmonary Embolism (PE) W or WO Contrast  Result Date: 10/21/2021 CLINICAL DATA:  Pulmonary embolism (PE) suspected, high prob Left-sided chest pain. EXAM: CT ANGIOGRAPHY CHEST WITH CONTRAST TECHNIQUE: Multidetector CT imaging of the chest was performed using the standard protocol during bolus  administration of intravenous contrast. Multiplanar CT image reconstructions and MIPs were obtained to evaluate the vascular anatomy. RADIATION DOSE REDUCTION: This exam was performed according to the departmental dose-optimization program which includes automated exposure control, adjustment of the mA and/or kV according to patient size and/or use of iterative reconstruction technique. CONTRAST:  70mL OMNIPAQUE IOHEXOL 350 MG/ML SOLN COMPARISON:  Radiograph earlier today no prior chest CT. FINDINGS: Cardiovascular: Examination is positive for bilateral pulmonary emboli. There are filling defects within the segmental branches to both upper lobes. There is lobar involvement of the right middle lobe. Probable subsegmental involvement in the right lower lobe, although obscured by motion on the current exam. There may be an element of chronic clot as some of the filling defects are Armentha Branagan centric and peripheral. Overall clot burden is moderate. Positive for right heart strain with RV to LV ratio of 1.22. the heart is normal in size. Aortic tortuosity with minimal atherosclerosis. No pericardial effusion. Mediastinum/Nodes: No mediastinal adenopathy. No enlarged hilar lymph nodes. Small hiatal hernia. No visualized thyroid nodule. Lungs/Pleura: Moderate emphysema. Mild central bronchial thickening. Perifissural atelectasis in the left upper lobe. Subsegmental atelectasis in the right greater than left lower lobes. No confluent consolidation. No pleural effusion. No pulmonary mass. The trachea and central bronchi are patent. Upper Abdomen: Simple cysts in both kidneys are partially included, needing no further follow-up. Cholecystectomy. Left colonic diverticulosis. No acute upper abdominal findings. Musculoskeletal: Prominent Schmorl's node with slight loss of height involving T11 superior endplate, appearing chronic. There are no acute or suspicious osseous abnormalities. Review of the MIP images confirms the above findings.  IMPRESSION: 1. Positive for acute pulmonary embolus with CT evidence of right heart strain (RV/LV Ratio = 1.22) consistent with at least submassive (intermediate risk) PE. The presence of right heart strain has been associated with an increased risk of morbidity and mortality. Please refer to the "Code PE Focused" order set in EPIC. 2. Emphysema with mild bronchial thickening. Scattered atelectasis in the left upper and both lower lobes. Aortic Atherosclerosis (ICD10-I70.0) and Emphysema (ICD10-J43.9). Critical Value/emergent results were called by telephone at the time of interpretation on 10/21/2021 at  9:51 pm to provider JULIE HAVILAND , who verbally acknowledged these results. Electronically Signed   By: Narda Rutherford M.D.   On: 10/21/2021 21:52   DG Chest 2 View  Result Date: 10/21/2021 CLINICAL DATA:  86 year old male with history of left shoulder and left upper chest pain. EXAM: CHEST - 2 VIEW COMPARISON:  Chest x-ray 10/02/2015. FINDINGS: Lung volumes are normal. No consolidative airspace disease. No pleural effusions. No pneumothorax. No pulmonary nodule or mass noted. Pulmonary vasculature and the cardiomediastinal silhouette are within normal limits. Atherosclerosis in the thoracic aorta. IMPRESSION: 1.  No radiographic evidence of acute cardiopulmonary disease. 2. Aortic atherosclerosis. Electronically Signed   By: Trudie Reed M.D.   On: 10/21/2021 07:38    Microbiology: No results found for this or any previous visit (from the past 240 hour(s)).   Labs: Basic Metabolic Panel: Recent Labs  Lab 10/21/21 0715 10/22/21 0559 10/23/21 0035  NA 143 143 139  K 3.7 3.5 3.9  CL 108 108 105  CO2 25 26 26   GLUCOSE 144* 106* 107*  BUN 16 18 18   CREATININE 1.34* 1.38* 1.17  CALCIUM 9.1 8.5* 8.1*   Liver Function Tests: Recent Labs  Lab 10/22/21 0559  AST 11*  ALT 12  ALKPHOS 93  BILITOT 1.6*  PROT 6.3*  ALBUMIN 3.0*   No results for input(s): "LIPASE", "AMYLASE" in the last  168 hours. No results for input(s): "AMMONIA" in the last 168 hours. CBC: Recent Labs  Lab 10/21/21 0715 10/22/21 0559 10/23/21 0035  WBC 11.8* 7.4 9.3  HGB 15.1 13.9 13.6  HCT 46.5 42.9 39.0  MCV 94.9 96.0 92.9  PLT 186 166 170   Cardiac Enzymes: No results for input(s): "CKTOTAL", "CKMB", "CKMBINDEX", "TROPONINI" in the last 168 hours. BNP: BNP (last 3 results) Recent Labs    10/21/21 2242  BNP 54.3    ProBNP (last 3 results) No results for input(s): "PROBNP" in the last 8760 hours.  CBG: Recent Labs  Lab 10/21/21 0718  GLUCAP 142*       Signed:  10/23/21 MD.  [facility] 10/23/2021, 2:53 PM

## 2021-10-23 NOTE — Progress Notes (Signed)
Nsg Discharge Note  Admit Date:  10/21/2021 Discharge date: 10/23/2021   Louis Bryan to be D/C'd Home per MD order.  AVS completed.  Patient/caregiver able to verbalize understanding.  Discharge Medication: Allergies as of 10/23/2021   No Known Allergies      Medication List     STOP taking these medications    aspirin 81 MG tablet       TAKE these medications    acetaminophen 500 MG tablet Commonly known as: TYLENOL Take 500 mg by mouth every 6 (six) hours as needed for moderate pain.   allopurinol 100 MG tablet Commonly known as: ZYLOPRIM Take 1 tablet (100 mg total) by mouth daily.   Eliquis DVT/PE Starter Pack Generic drug: Apixaban Starter Pack (10mg  and 5mg ) Take as directed on package: start with two-5mg  tablets twice daily for 7 days. On day 8, switch to one-5mg  tablet twice daily.   losartan-hydrochlorothiazide 50-12.5 MG tablet Commonly known as: HYZAAR Take 1 tablet by mouth daily.   metoprolol succinate 25 MG 24 hr tablet Commonly known as: TOPROL-XL Take 1 tablet (25 mg total) by mouth daily. with food   simvastatin 40 MG tablet Commonly known as: ZOCOR Take 40 mg by mouth at bedtime.   STOOL SOFTENER PO Take 1 capsule by mouth every evening.        Discharge Assessment: Vitals:   10/23/21 1257 10/23/21 1500  BP: (!) 137/91 (!) 148/89  Pulse: 79 63  Resp: 20 16  Temp: (!) 97.3 F (36.3 C) 97.9 F (36.6 C)  SpO2:     Skin clean, dry and intact without evidence of skin break down, no evidence of skin tears noted. IV catheter discontinued intact. Site without signs and symptoms of complications - no redness or edema noted at insertion site, patient denies c/o pain - only slight tenderness at site.  Dressing with slight pressure applied.  D/c Instructions-Education: Discharge instructions given to patient/family with verbalized understanding. D/c education completed with patient/family including follow up instructions, medication list, d/c  activities limitations if indicated, with other d/c instructions as indicated by MD - patient able to verbalize understanding, all questions fully answered. Patient instructed to return to ED, call 911, or call MD for any changes in condition.  Patient escorted via WC, and D/C home via private auto.  Chonda Baney, 10/25/21, RN 10/23/2021 4:57 PM

## 2021-10-23 NOTE — Progress Notes (Signed)
Pt educated on Eliquis. Pt informed that prescription has already been filled and pt should not leave without it. Will continue to monitor plans for dc this weekend.

## 2021-10-23 NOTE — Evaluation (Signed)
Physical Therapy Evaluation Patient Details Name: Louis Bryan MRN: [id number] DOB: 1935/07/05 Today's Date: 10/23/2021  History of Present Illness  Pt adm 6/15 with chest pain. Found to have acute PE. PMH - HTN, gout, DM, glaucoma  Clinical Impression  Pt doing well with mobility and no further PT needed.  Ready for dc from PT standpoint. Pt with SpO2 >92% on RA throughout treatment.        Recommendations for follow up therapy are one component of a multi-disciplinary discharge planning process, led by the attending physician.  Recommendations may be updated based on patient status, additional functional criteria and insurance authorization.  Follow Up Recommendations No PT follow up    Assistance Recommended at Discharge None  Patient can return home with the following       Equipment Recommendations None recommended by PT  Recommendations for Other Services       Functional Status Assessment Patient has not had a recent decline in their functional status     Precautions / Restrictions Precautions Precautions: None Restrictions Weight Bearing Restrictions: No      Mobility  Bed Mobility Overal bed mobility: Independent                  Transfers Overall transfer level: Independent Equipment used: None                    Ambulation/Gait Ambulation/Gait assistance: Independent Gait Distance (Feet): 450 Feet Assistive device: None Gait Pattern/deviations: WFL(Within Functional Limits) Gait velocity: normal Gait velocity interpretation: >4.37 ft/sec, indicative of normal walking speed   General Gait Details: Steady gait  Stairs            Wheelchair Mobility    Modified Rankin (Stroke Patients Only)       Balance Overall balance assessment: No apparent balance deficits (not formally assessed)                                           Pertinent Vitals/Pain Pain Assessment Pain Assessment: No/denies pain    Home  Living Family/patient expects to be discharged to:: Private residence Living Arrangements: Alone   Type of Home: House Home Access: Stairs to enter Entrance Stairs-Rails: Right Entrance Stairs-Number of Steps: 5   Home Layout: One level Home Equipment: None      Prior Function Prior Level of Function : Independent/Modified Independent;Driving             Mobility Comments: no assistive device       Hand Dominance        Extremity/Trunk Assessment   Upper Extremity Assessment Upper Extremity Assessment: Overall WFL for tasks assessed    Lower Extremity Assessment Lower Extremity Assessment: Overall WFL for tasks assessed    Cervical / Trunk Assessment Cervical / Trunk Assessment: Normal  Communication   Communication: No difficulties  Cognition Arousal/Alertness: Awake/alert Behavior During Therapy: WFL for tasks assessed/performed Overall Cognitive Status: Within Functional Limits for tasks assessed                                          General Comments General comments (skin integrity, edema, etc.): SpO2 > 92% on RA    Exercises     Assessment/Plan    PT Assessment Patient does not need any  further PT services  PT Problem List         PT Treatment Interventions      PT Goals (Current goals can be found in the Care Plan section)  Acute Rehab PT Goals PT Goal Formulation: All assessment and education complete, DC therapy    Frequency       Co-evaluation               AM-PAC PT "6 Clicks" Mobility  Outcome Measure Help needed turning from your back to your side while in a flat bed without using bedrails?: None Help needed moving from lying on your back to sitting on the side of a flat bed without using bedrails?: None Help needed moving to and from a bed to a chair (including a wheelchair)?: None Help needed standing up from a chair using your arms (e.g., wheelchair or bedside chair)?: None Help needed to walk in  hospital room?: None Help needed climbing 3-5 steps with a railing? : None 6 Click Score: 24    End of Session   Activity Tolerance: Patient tolerated treatment well Patient left: in chair;with call bell/phone within reach Nurse Communication: Mobility status PT Visit Diagnosis: Other abnormalities of gait and mobility (R26.89)    Time: 6160-7371 PT Time Calculation (min) (ACUTE ONLY): 16 min   Charges:   PT Evaluation $PT Eval Low Complexity: 1 Low          [facility] PT Acute Rehabilitation Services Office [phone]   Angelina Ok [facility] 10/23/2021, 10:21 AM

## 2021-10-24 LAB — CARDIOLIPIN ANTIBODIES, IGG, IGM, IGA
Anticardiolipin IgA: <9 APL U/mL (ref 0–11)
Anticardiolipin IgG: <9 GPL U/mL (ref 0–14)
Anticardiolipin IgM: <9 MPL U/mL (ref 0–12)

## 2021-10-24 LAB — PROTEIN C, TOTAL: Protein C, Total: 96 % (ref 60–150)

## 2021-10-25 ENCOUNTER — Encounter (INDEPENDENT_AMBULATORY_CARE_PROVIDER_SITE_OTHER): Payer: Self-pay

## 2021-10-27 ENCOUNTER — Ambulatory Visit (INDEPENDENT_AMBULATORY_CARE_PROVIDER_SITE_OTHER): Payer: Medicare Other | Admitting: Ophthalmology

## 2021-10-27 ENCOUNTER — Encounter (INDEPENDENT_AMBULATORY_CARE_PROVIDER_SITE_OTHER): Payer: Self-pay | Admitting: Ophthalmology

## 2021-10-27 DIAGNOSIS — H33102 Unspecified retinoschisis, left eye: Secondary | ICD-10-CM | POA: Diagnosis not present

## 2021-10-27 DIAGNOSIS — H43821 Vitreomacular adhesion, right eye: Secondary | ICD-10-CM

## 2021-10-27 DIAGNOSIS — E119 Type 2 diabetes mellitus without complications: Secondary | ICD-10-CM | POA: Diagnosis not present

## 2021-10-27 DIAGNOSIS — H35372 Puckering of macula, left eye: Secondary | ICD-10-CM | POA: Diagnosis not present

## 2021-10-27 LAB — PROTEIN S, TOTAL: Protein S Ag, Total: 129 % (ref 60–150)

## 2021-10-27 LAB — PROTEIN S ACTIVITY: Protein S Activity: 103 % (ref 63–140)

## 2021-10-27 LAB — LUPUS ANTICOAGULANT PANEL
DRVVT: 50 s — ABNORMAL HIGH (ref 0.0–47.0)
PTT Lupus Anticoagulant: 54.4 s — ABNORMAL HIGH (ref 0.0–43.5)

## 2021-10-27 LAB — PTT-LA MIX: PTT-LA Mix: 51.5 s — ABNORMAL HIGH (ref 0.0–40.5)

## 2021-10-27 LAB — PROTEIN C ACTIVITY: Protein C Activity: 89 % (ref 73–180)

## 2021-10-27 LAB — HEXAGONAL PHASE PHOSPHOLIPID: Hexagonal Phase Phospholipid: 3 s (ref 0–11)

## 2021-10-27 LAB — DRVVT CONFIRM: dRVVT Confirm: 1 ratio (ref 0.8–1.2)

## 2021-10-27 LAB — DRVVT MIX: dRVVT Mix: 44.3 s — ABNORMAL HIGH (ref 0.0–40.4)

## 2021-10-27 NOTE — Assessment & Plan Note (Signed)
Progression of epiretinal membrane now clinically seen shiny and on the nasal aspect of the macula likely triggering ongoing or persistent schisis of the inner retina accounting for acuity 

## 2021-10-27 NOTE — Assessment & Plan Note (Signed)
Minor OD 

## 2021-10-27 NOTE — Progress Notes (Signed)
10/27/2021     CHIEF COMPLAINT Patient presents for  Chief Complaint  Patient presents with   Eye Problem      HISTORY OF PRESENT ILLNESS: Louis Bryan is a 86 y.o. male who presents to the clinic today for:   HPI   WIP- 1-2 WEEKS FU FROM [place] VISIT- WORSENING FOVEAL SCHISIS. Pt was last seen about 8 weeks ago. Pt stated vision has been stable since last visit. Pt c/o multiple floaters only in the left eye. Pt denies FOL. Pt was seen at the emergency room last Thursday. Pt was put on blood thinners. Last edited by Angeline Slim on 10/27/2021  3:09 PM.      Referring physician: No referring provider defined for this encounter.  HISTORICAL INFORMATION:   Selected notes from the MEDICAL RECORD NUMBER    Lab Results  Component Value Date   HGBA1C 6.4 (H) 07/11/2017     CURRENT MEDICATIONS: No current outpatient medications on file. (Ophthalmic Drugs)   No current facility-administered medications for this visit. (Ophthalmic Drugs)   Current Outpatient Medications (Other)  Medication Sig   acetaminophen (TYLENOL) 500 MG tablet Take 500 mg by mouth every 6 (six) hours as needed for moderate pain.   allopurinol (ZYLOPRIM) 100 MG tablet Take 1 tablet (100 mg total) by mouth daily.   APIXABAN (ELIQUIS) VTE STARTER PACK (10MG  AND 5MG ) Take as directed on package: start with two-5mg  tablets twice daily for 7 days. On day 8, switch to one-5mg  tablet twice daily.   Docusate Calcium (STOOL SOFTENER PO) Take 1 capsule by mouth every evening.   losartan-hydrochlorothiazide (HYZAAR) 50-12.5 MG tablet Take 1 tablet by mouth daily.   metoprolol succinate (TOPROL-XL) 25 MG 24 hr tablet Take 1 tablet (25 mg total) by mouth daily. with food   simvastatin (ZOCOR) 40 MG tablet Take 40 mg by mouth at bedtime.   No current facility-administered medications for this visit. (Other)      REVIEW OF SYSTEMS: ROS   Negative for: Constitutional, Gastrointestinal, Neurological, Skin,  Genitourinary, Musculoskeletal, HENT, Endocrine, Cardiovascular, Eyes, Respiratory, Psychiatric, Allergic/Imm, Heme/Lymph Last edited by on 10/27/2021  3:09 PM.       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   History of exercise stress test [id number]   negative bruce protocol excercise stress test with scintigraphic evidence of diaphragmatic attenuation and mild apical thinning, dynamic gating was not performed secondary to frequent ectopy, low risk study   Hyperlipidemia    Hypertension    Shortness of breath    On exertion   Tobacco abuse    Past Surgical History:  Procedure Laterality Date   CATARACT EXTRACTION Left 07/2016   CHOLECYSTECTOMY N/A 10/27/2012   Procedure: LAPAROSCOPIC CHOLECYSTECTOMY WITH INTRAOPERATIVE CHOLANGIOGRAM;  Surgeon: 08/2016, MD;  Location: [facility];  Service: General;  Laterality: N/A;    FAMILY HISTORY Family History  Problem Relation Age of Onset   Diabetes Sister    Diabetes Sister     SOCIAL HISTORY Social History   Tobacco Use   Smoking status: Former    Types: Cigarettes    Quit date: 06/13/1992    Years since quitting: 29.3   Smokeless tobacco: Never  Vaping Use   Vaping Use: Never used  Substance Use Topics   Alcohol use: No   Drug use: No         OPHTHALMIC EXAM:  Base Eye  Exam     Visual Acuity (ETDRS)       Right Left   Dist [place] 20/40 -1 20/80 -1   Dist ph [place] NI 20/70 -2         Tonometry (Tonopen, 3:14 PM)       Right Left   Pressure 16 17         Pupils       Pupils APD   Right PERRL None   Left PERRL None         Visual Fields       Left Right    Full Full         Extraocular Movement       Right Left    Full Full         Neuro/Psych     Oriented x3: Yes   Mood/Affect: Normal         Dilation     Left eye: 2.5% Phenylephrine, 1.0% Mydriacyl @ 3:14 PM            Slit Lamp and Fundus Exam     External Exam       Right Left   External Normal Normal         Slit Lamp Exam       Right Left   Lids/Lashes Normal Normal   Conjunctiva/Sclera White and quiet White and quiet   Cornea Clear Clear   Anterior Chamber Deep and quiet Deep and quiet   Iris Round and reactive Round and reactive   Lens Posterior chamber intraocular lens Posterior chamber intraocular lens   Anterior Vitreous Normal Normal         Fundus Exam       Right Left   Posterior Vitreous Partial posterior vitreous detachment,  Vitrectomized   Disc Normal Splinter hemorrhage at superior pole of nerve   C/D Ratio 0.65 0.65   Macula Subtle color change to the RPE in the foveal region.  Negative Watzke. Pseudocystoid change in the fovea, Retinal pigment epithelial mottling, no hemorrhage, no macular thickening   Vessels Normal, no DR Normal, no DR   Periphery Normal Normal            IMAGING AND PROCEDURES  Imaging and Procedures for 10/27/21  OCT, Retina - OU - Both Eyes       Right Eye Quality was good. Scan locations included subfoveal. Central Foveal Thickness: 269. Progression has improved. Findings include abnormal foveal contour, retinal drusen , vitreous traction, vitreomacular adhesion .   Left Eye Quality was good. Scan locations included subfoveal. Central Foveal Thickness: 415. Progression has improved. Findings include abnormal foveal contour, epiretinal membrane, macular pucker.   Notes OD with outer retinal drusenoid deposit in the subfoveal location likely secondary to vitreous traction on the inner retina, this has improved over the last 3 years OD, with less disturbance to the photoreceptor and ellipsoid layers.  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 Disc findings include normal observations. Macula : normal observations. Periphery : normal observations.   Left Eye Progression has been stable.   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 Vitreomacular adhesion of right eye  H43.821 OCT, Retina - OU - Both Eyes    Color Fundus Photography Optos - OU - Both Eyes    2. Epiretinal membrane, left eye  H35.372     3. Macular retinoschisis, left  H33.102     4. Type 2 diabetes mellitus without complication, without long-term current use of insulin (HCC)  E11.9       1.  Progressive vision loss left eye with worsening of schisis left eye and thickening on the basis of epiretinal membrane.  Will need to consider vitrectomy membrane peel, ILM peel.  2.  OS with previous vitrectomy for massive VMT but no ILM peel required.  Condition overall improved since 2018 yet now intraretinal with progressive schisis will  require vitrectomy membrane peel with ILM peel.  3.  OD, VMA, no impact on acuity continue observe  Ophthalmic Meds Ordered this visit:  No orders of the defined types were placed in this encounter.      Return ,, [facility], for OS will need to schedule vitrectomy membrane peel, 67042 ILM peel.  There are no Patient Instructions on file for this visit.   Explained the diagnoses, plan, and follow up with the patient and they expressed understanding.  Patient expressed understanding of the importance of proper follow up care.   Alford Highland Tiasia Weberg M.D. Diseases & Surgery of the Retina and Vitreous [facility] 10/27/21     Abbreviations: M myopia (nearsighted); A astigmatism; H hyperopia (farsighted); P presbyopia; Mrx spectacle prescription;  CTL contact lenses; OD right eye; OS left eye; OU both eyes  XT exotropia; ET esotropia; PEK punctate epithelial keratitis; PEE punctate epithelial erosions; DES dry eye syndrome; MGD meibomian gland dysfunction; ATs artificial tears; PFAT's preservative free artificial tears; NSC nuclear sclerotic cataract; PSC posterior subcapsular cataract; ERM epi-retinal membrane; PVD posterior vitreous detachment; RD retinal detachment; DM diabetes mellitus; DR diabetic retinopathy; NPDR non-proliferative diabetic retinopathy; PDR proliferative diabetic retinopathy; CSME clinically significant macular edema; DME diabetic macular edema; dbh dot blot hemorrhages; CWS cotton wool spot; POAG primary open angle glaucoma; C/D cup-to-disc ratio; HVF humphrey visual field; GVF goldmann visual field; OCT optical coherence tomography; IOP intraocular pressure; BRVO Branch retinal vein occlusion; CRVO central retinal vein occlusion; CRAO central retinal artery occlusion; BRAO branch retinal artery occlusion; RT retinal tear; SB scleral buckle; PPV pars plana vitrectomy; VH Vitreous hemorrhage; PRP panretinal laser photocoagulation; IVK intravitreal  kenalog; VMT vitreomacular traction; MH Macular hole;  NVD neovascularization of the disc; NVE neovascularization elsewhere; AREDS age related eye disease study; ARMD age related macular degeneration; POAG primary open angle glaucoma; EBMD epithelial/anterior basement membrane  dystrophy; ACIOL anterior chamber intraocular lens; IOL intraocular lens; PCIOL posterior chamber intraocular lens; Phaco/IOL phacoemulsification with intraocular lens placement; [place] photorefractive keratectomy; LASIK laser assisted in situ keratomileusis; HTN hypertension; DM diabetes mellitus; COPD chronic obstructive pulmonary disease

## 2021-10-27 NOTE — Assessment & Plan Note (Signed)
No detectable diabetic retinopathy 

## 2021-10-27 NOTE — Assessment & Plan Note (Signed)
Progression of epiretinal membrane now clinically seen shiny and on the nasal aspect of the macula likely triggering ongoing or persistent schisis of the inner retina accounting for acuity

## 2021-10-28 LAB — PROTHROMBIN GENE MUTATION

## 2021-11-01 ENCOUNTER — Ambulatory Visit (INDEPENDENT_AMBULATORY_CARE_PROVIDER_SITE_OTHER): Payer: Medicare Other

## 2021-11-01 ENCOUNTER — Encounter (INDEPENDENT_AMBULATORY_CARE_PROVIDER_SITE_OTHER): Payer: Self-pay

## 2021-11-01 LAB — FACTOR 5 LEIDEN

## 2021-11-01 MED ORDER — PREDNISOLONE ACETATE 1 % OP SUSP: 1.0000 [drp] | Freq: Four times a day (QID) | OPHTHALMIC | 0 refills | Status: DC

## 2021-11-01 MED ORDER — OFLOXACIN 0.3 % OP SOLN: 1.0000 [drp] | Freq: Four times a day (QID) | OPHTHALMIC | 0 refills | Status: DC

## 2021-11-03 ENCOUNTER — Encounter (INDEPENDENT_AMBULATORY_CARE_PROVIDER_SITE_OTHER): Payer: Medicare Other | Admitting: Ophthalmology

## 2021-11-03 ENCOUNTER — Ambulatory Visit: Payer: Medicare Other | Admitting: Cardiology

## 2021-11-03 ENCOUNTER — Encounter: Payer: Self-pay | Admitting: Cardiology

## 2021-11-03 VITALS — BP 112/77 | HR 80 | Ht 66.0 in | Wt 180.0 lb

## 2021-11-03 DIAGNOSIS — E785 Hyperlipidemia, unspecified: Secondary | ICD-10-CM

## 2021-11-03 DIAGNOSIS — R0609 Other forms of dyspnea: Secondary | ICD-10-CM | POA: Diagnosis not present

## 2021-11-03 DIAGNOSIS — I4589 Other specified conduction disorders: Secondary | ICD-10-CM | POA: Diagnosis not present

## 2021-11-03 DIAGNOSIS — I10 Essential (primary) hypertension: Secondary | ICD-10-CM | POA: Diagnosis not present

## 2021-11-03 DIAGNOSIS — I2609 Other pulmonary embolism with acute cor pulmonale: Secondary | ICD-10-CM

## 2021-11-03 DIAGNOSIS — R072 Precordial pain: Secondary | ICD-10-CM

## 2021-11-03 NOTE — Patient Instructions (Addendum)
Medication Instructions:  No changes  *If you need a refill on your cardiac medications before your next appointment, please call your pharmacy*   Lab Work: Not needed    Testing/Procedures: Schedule  at [address]  after Aug 15,2023  Your physician has requested that you have en exercise stress myoview. Please follow instruction sheet, as given.    Follow-Up: At [facility], you and your health needs are our priority.  As part of our continuing mission to provide you with exceptional heart care, we have created designated Provider Care Teams.  These Care Teams include your primary Cardiologist (physician) and Advanced Practice Providers (APPs -  Physician Assistants and Nurse Practitioners) who all work together to provide you with the care you need, when you need it.     Your next appointment:   3 month(s)  The format for your next appointment:   In Person  Provider:   Bryan Lemma, MD    Other Instructions  [phone]   Your doctor has scheduled you for a Myocardial Perfusion scan to obtain information about the blood flow to your heart. The test consists of taking pictures of your heart in two phases: while resting and after a stress test.  The stress test will involve walking on a treadmill, or if you are unable to exercise adequately, you will be given a drug intended to have a similar effect on the heart to that of exercise.  The test will take approximately 3 to 4  hours to complete.  If you are pregnant or breastfeeding,  please notify the staff prior to your test.  How to prepare for your test: Do not eat or drink 2 hours prior to your test Do not consume products containing caffeine 12 hours prior to your test (examples: coffee (regular OR decaf), chocolate, sodas, tea) Your doctor may need you to hold certain medications prior to the test.  If so, these are listed below and should not be taken for 24 hours prior to the test.  If not listed  below, you may take your medications as normal.  You may resume taking held medications on your normal schedule once the test is complete.   Meds to hold: none Do bring a list of your current medications with you.  If you have held any meds in preparation for the test, please bring them, as you may be required to take them once the test is completed. Do wear comfortable clothes and walking shoes.  Do not wear dresses or overalls. Do NOT wear cologne, perfume, aftershave, or fragranced lotions the day of your test (deodorants okay). If these instructions are not followed your test will have to be rescheduled.   A nuclear cardiologist will review your test, prepare a report and send it to your physician.   If you have questions or concerns about your appointment, you can call the Nuclear Cardiology department at [phone]. If you cannot keep your appointment, please provide 48 hours notification to avoid a possible $50.00 charge to your account.   Please arrive 15 minutes prior to your appointment time for registration and insurance purposes

## 2021-11-03 NOTE — Progress Notes (Signed)
Primary Care Provider: Porfirio Oar, PA [facility] Cardiologist: Bryan Lemma, MD Electrophysiologist: None  Clinic Note: Chief Complaint  Patient presents with   New Patient (Initial Visit)    Initially referred for concerns of chronotropic incompetence, exertional dyspnea and chest pain   Hospitalization Follow-up    Recently admitted with PE, shortly after the consult placed    ===================================  ASSESSMENT/PLAN   Problem List Items Addressed This Visit       Cardiology Problems   Essential hypertension (Chronic)    Stable blood pressure on low-dose Toprol and intermediate dose losartan and HCTZ.      Relevant Orders   EKG 12-Lead   Cardiac Stress Test: Informed Consent Details: Physician/Practitioner Attestation; Transcribe to consent form and obtain patient signature   Hyperlipidemia (Chronic)   Relevant Orders   EKG 12-Lead   Cardiac Stress Test: Informed Consent Details: Physician/Practitioner Attestation; Transcribe to consent form and obtain patient signature   Pulmonary embolism (HCC) (Chronic)    Acute submassive PE with bilateral segmental lobar PEs noted on CT scan.  Presentation with chest pain, diuresis and dyspnea.  Now on Eliquis for plans of 3 to 6 months.  I truthfully wonder if he potentially was having intermittent flecks of PE over the few weeks leading up to his hospitalization that were the cause of his symptoms described to the PCP.  He is on Eliquis, and with an unprovoked PE would at least do 6 months DOAC.  Can reassess hypercoagulable state after 6 months as the hypercoagulable state checked in the hospital is likely compounded by him already being on heparin.        Other   Chronotropic incompetence - medication related (Chronic)    To clarify, he had evidence of chronotropic incompetence on increased dose of Toprol that improved on lower dose Toprol.  With having exertional dyspnea, he was referred to  reassess.  Need to see his heart rate responsiveness.  To evaluate for chest pain and potential lack of heart rate responsiveness/chronotropic incompetence, will check Exercise Treadmill Myoview Stress Test.  This needs to be done while on his beta-blocker dose.  Treadmill portion needs to be completed, if not able to restart heart rate can convert to Lexiscan, but the treadmill portion needs to be completed and read by the reading physician.  Shared Decision Making/Informed Consent The risks [chest pain, shortness of breath, cardiac arrhythmias, dizziness, blood pressure fluctuations, myocardial infarction, stroke/transient ischemic attack, nausea, vomiting, allergic reaction, radiation exposure, metallic taste sensation and life-threatening complications (estimated to be 1 in 10,000)], benefits (risk stratification, diagnosing coronary artery disease, treatment guidance) and alternatives of a nuclear stress test were discussed in detail with Louis Bryan and he agrees to proceed.      Relevant Orders   EKG 12-Lead   Cardiac Stress Test: Informed Consent Details: Physician/Practitioner Attestation; Transcribe to consent form and obtain patient signature   MYOCARDIAL PERFUSION IMAGING   DOE (dyspnea on exertion) - Primary    Exertional dyspnea and with some chest discomfort concerning for possible anginal equivalent.  I truly think that he was having intermittent PE events leading up to his submassive PE.  He had multiple scattered pulmonary emboli suggesting shower emboli from distal DVT.  CT scan only showed PE, it also showed emphysema suggesting a COPD complement. It did not suggest significant coronary artery calcification.  Exercise Treadmill Myoview Stress Test-the intention is to evaluate chronotropic response while on beta-blocker and also to evaluate for ischemic CAD.  Relevant Orders   EKG 12-Lead   Cardiac Stress Test: Informed Consent Details: Physician/Practitioner Attestation;  Transcribe to consent form and obtain patient signature   MYOCARDIAL PERFUSION IMAGING   Precordial pain    Precordial pain with exertional dyspnea, Likely related to PE, however will exclude ischemic CAD  Plan Exercise Treadmill Myoview Stress Test to exclude ischemic CAD and chronotropic response.  We will wait for at least 2 months following the PE to check stress test to avoid exacerbation of dyspnea from the PE.      Relevant Orders   EKG 12-Lead   Cardiac Stress Test: Informed Consent Details: Physician/Practitioner Attestation; Transcribe to consent form and obtain patient signature   MYOCARDIAL PERFUSION IMAGING    ===================================  HPI:    Louis Bryan is a 86 y.o. male with PMH notable for HTN, HLD, Emphysema on CT scan, previously described medication related Chronotropic Incompetence (improved on lower dose Toprol) who was recently Discharged with PULMONARY EMBOLUS (Submassive Bilateral PE). He is being seen today for the evaluation of EXERTIONAL DYSPNEA, CHEST TIGHTNESS and possible CHRONOTROPIC INCOMPETENCE at the request of Porfirio Oar, PA.  Referral made prior to diagnosis of PE  Louis Bryan was last seen in April 2017 as a follow-up from CPAP done in 2014 demonstrating submaximal effort due to difficulty meeting target heart rate-questionable chronotropic incompetence related to Toprol.  Dose was reduced.  At this time he was doing much better on lower dose.  Walking at least a mile whenever he can get a chance to.  Maybe 2 or 3 days a week.  At more energetic.  Just lacking motivation to do so.  No chest pain or pressure.  Just a little exertional dyspnea.  Had to stop take a break walking uphill.  No PND, orthopnea. => Decided against rechecking GXT since there were no more symptoms of exertional dyspnea or fatigue.  Recent Hospitalizations:  Seen by PCP on 10/20/2021 with diaphoresis, and dyspnea.  Based on history of chronotropic incompetence and no  recent cardiology evaluation he was referred back to cardiology.  However due to the severity of symptoms he was referred to the ER if symptoms worsen.  (Which they did) Admitted 6/15-6/17/2023 -> presented to [facility], ER with complaints of waking up at 6 AM with left-sided chest pain radiating to the shoulder accompanied by dyspnea.  He was found to have submassive PE.  Started on Eliquis-plan is Eliquis for 3 to 6 months.    Hypercoagulable panel was ordered and be followed up by PCP.  It is quite possible that some of the values were thrown off by heparin administration.  He was then seen by Porfirio Oar, PA for hospital follow-up on 10/29/2021.  He noted that he was getting a little bit short of breath" warm when he first gets up in the morning but overall feeling better since his hospitalization.  Doing okay on Eliquis.  Had just completed loading dose of 2 tablets twice daily with plans to reduce to 1 tablet twice daily.  No bleeding issues. -> Nonprovoked PE with plans to treat for the least 3 to 6 months DOAC (my recommendation will be a minimum of 6 months).   Reviewed  CV studies:    The following studies were reviewed today: (if available, images/films reviewed: From Epic Chart or Care Everywhere) TTE 10/22/2021: EF 60 to 65%.  GR 1 DD.  No RWMA.  Normal PAP.  Aortic valve sclerosis with no stenosis.  Normal RAP. CTA Chest-PE  10/21/2021: Acute bilateral PE: CT evidence of RV heart strain.  Submassive PE-filling defects within segmental branches to both upper lobes, right middle lobe and segmental involvement of the right lower lobe.  Moderate clot burden.  Aortic tortuosity with minimal atherosclerosis.  Emphysema with mild bronchial thickening  Interval History:   Louis Bryan presents here today along with his daughter who helps out with his history.  He is feeling a whole lot better since his discharge with the PE but still does have some exertional dyspnea.  Prior to his PE he was  noticing progressively worsening exertional dyspnea and easy fatigability.  He was also having some intermittent discomfort in his chest. He really has not gotten back into doing activity since his discharge from the hospital, so cannot tell me how he is feeling right now.  He seems to be tolerating Eliquis without any bleeding issues. He denies any PND, orthopnea or edema.  He says he feels the palpitations when he lies down at night but did just some skipped beats urinary and that are not all that bothersome.  No rapid irregular heartbeats. He says he only feels heart rate going up when he is nervous, or upset. No syncope/near syncope or TIA/amaurosis fugax except for when he had the chest pain that taken to the ER.   REVIEWED OF SYSTEMS   Review of Systems  Constitutional:  Positive for malaise/fatigue. Negative for weight loss.  HENT:  Negative for congestion and nosebleeds.   Respiratory:         Per HPI  Cardiovascular:  Negative for leg swelling.       Per HPI  Gastrointestinal:  Negative for blood in stool and melena.  Genitourinary:  Negative for hematuria.  Musculoskeletal:  Positive for joint pain. Negative for falls and myalgias.  Neurological:  Negative for dizziness, focal weakness, weakness and headaches.  Endo/Heme/Allergies:  Does not bruise/bleed easily.  Psychiatric/Behavioral:  Positive for memory loss (Poor historian). The patient is not nervous/anxious and does not have insomnia (Frequent nocturia).     I have reviewed and (if needed) personally updated the patient's problem list, medications, allergies, past medical and surgical history, social and family history.   PAST MEDICAL HISTORY   Past Medical History:  Diagnosis Date   Chronotropic incompetence - medication related 09/24/2012   History of exercise stress test [id number]   negative bruce protocol excercise stress test with scintigraphic evidence of diaphragmatic attenuation and mild apical thinning, dynamic  gating was not performed secondary to frequent ectopy, low risk study   Hyperlipidemia    Hypertension    Shortness of breath    On exertion   Tobacco abuse     PAST SURGICAL HISTORY   Past Surgical History:  Procedure Laterality Date   CATARACT EXTRACTION Left 07/2016   CHOLECYSTECTOMY N/A 10/27/2012   Procedure: LAPAROSCOPIC CHOLECYSTECTOMY WITH INTRAOPERATIVE CHOLANGIOGRAM;  Surgeon: Romie Levee, MD;  Location: [facility];  Service: General;  Laterality: N/A;    Immunization History  Administered Date(s) Administered   Influenza,inj,Quad PF,6+ Mos 01/22/2013, 05/27/2014, 04/07/2015, 02/14/2017   Influenza-Unspecified 03/21/2016   PFIZER Comirnaty(Gray Top)Covid-19 Tri-Sucrose Vaccine 11/20/2020   PFIZER(Purple Top)SARS-COV-2 Vaccination 06/04/2019, 06/25/2019, 03/30/2020   Pfizer Covid-19 Vaccine Bivalent Booster 33yrs & up 02/23/2021   Pneumococcal Conjugate-13 11/26/2013   Pneumococcal-Unspecified 09/29/2007   Tdap 11/10/2009   Zoster Recombinat (Shingrix) 07/29/2016, 09/27/2016    MEDICATIONS/ALLERGIES   Current Meds  Medication Sig   acetaminophen (TYLENOL) 500 MG tablet Take 500 mg by mouth every  6 (six) hours as needed for moderate pain.   allopurinol (ZYLOPRIM) 100 MG tablet Take 1 tablet (100 mg total) by mouth daily.   APIXABAN (ELIQUIS) VTE STARTER PACK (10MG  AND 5MG ) Take as directed on package: start with two-5mg  tablets twice daily for 7 days. On day 8, switch to one-5mg  tablet twice daily.   Docusate Calcium (STOOL SOFTENER PO) Take 1 capsule by mouth every evening.   losartan-hydrochlorothiazide (HYZAAR) 50-12.5 MG tablet Take 1 tablet by mouth daily.   metoprolol succinate (TOPROL-XL) 25 MG 24 hr tablet Take 1 tablet (25 mg total) by mouth daily. with food   simvastatin (ZOCOR) 40 MG tablet Take 40 mg by mouth at bedtime.    No Known Allergies  SOCIAL HISTORY/FAMILY HISTORY   Reviewed in Epic:   Social History   Tobacco Use   Smoking status: Former     Types: Cigarettes    Quit date: 06/13/1992    Years since quitting: 29.4   Smokeless tobacco: Never  Vaping Use   Vaping Use: Never used  Substance Use Topics   Alcohol use: No   Drug use: No   Social History   Social History Narrative   He cuts his grass with a push mover in the summer time for exercise. He wants to start back walking.    His daughter lives with him.   One sister lives locally.   Family History  Problem Relation Age of Onset   Diabetes Sister    Diabetes Sister     OBJCTIVE -PE, EKG, labs   Wt Readings from Last 3 Encounters:  11/03/21 180 lb (81.6 kg)  10/21/21 170 lb (77.1 kg)  08/28/17 194 lb 12.8 oz (88.4 kg)    Physical Exam: BP 112/77   Pulse 80   Ht 5\' 6"  (1.676 m)   Wt 180 lb (81.6 kg)   SpO2 97%   BMI 29.05 kg/m  Physical Exam Vitals reviewed.  Constitutional:      General: He is not in acute distress.    Appearance: Normal appearance. He is not ill-appearing or toxic-appearing.     Comments: Actually looks young for stated age.  Well-nourished and well-groomed.  Borderline obese.  HENT:     Head: Normocephalic and atraumatic.  Neck:     Vascular: No carotid bruit or JVD.  Cardiovascular:     Rate and Rhythm: Normal rate and regular rhythm. No extrasystoles are present.    Chest Wall: PMI is not displaced.     Pulses: Normal pulses.     Heart sounds: S1 normal and S2 normal. Heart sounds not distant. No murmur heard.    No friction rub. No gallop.  Pulmonary:     Effort: Pulmonary effort is normal. No respiratory distress.     Breath sounds: Normal breath sounds. No wheezing, rhonchi or rales.  Chest:     Chest wall: No tenderness.  Musculoskeletal:        General: No swelling. Normal range of motion.     Cervical back: Normal range of motion and neck supple.  Skin:    General: Skin is warm and dry.  Neurological:     General: No focal deficit present.     Mental Status: He is alert and oriented to person, place, and  time.  Psychiatric:        Mood and Affect: Mood normal.        Behavior: Behavior normal.        Thought Content: Thought content  normal.        Judgment: Judgment normal.    Adult ECG Report  Rate: 80;  Rhythm: normal sinus rhythm and Otherwise normal axis, intervals durations. ;   Narrative Interpretation: Stable  Recent Labs: Reviewed through Epic and Care Everywhere. [facility] Related to POCT CBC W/DIFF MEDONIC Component 10/29/21 10/20/21 05/26/21  WBC 8.0 9.7 9.1  HGB 15.0 15.2 15.9  Hematocrit 44.5 44.7 48.1  Platelet Count 290 199 213   Basic Metabolic Panel Component 10/29/21  10/20/21  08/24/21   Glucose 115 High  114 High  104 High   BUN 18 16 14   Creatinine 1.12 1.22 1.19  eGFR 65 58 Low  60  Sodium 139 149 High  146 High   Potassium 4.5 4.3 4.3  Chloride 101 107 High  107 High   CO2 21 20 23   CALCIUM 9.2 9.6 9.1   POCT A1C Component 08/24/21  05/26/21  02/23/21   Hemoglobin A1c 6.2 Abnormal  6.2 Abnormal  6.0 Abnormal   Lipid Panel With LDL/HDL Ratio Component 08/24/21  05/26/21  02/23/21  11/20/20   Cholesterol, Total 143 142 139 130  Triglycerides 106 136 113 115  HDL 59 50 54 51  VLDL Cholesterol Cal 19 24 20 21   LDL 65 68 65 58    Lab Results  Component Value Date   CREATININE 1.17 10/23/2021   BUN 18 10/23/2021   NA 139 10/23/2021   K 3.9 10/23/2021   CL 105 10/23/2021   CO2 26 10/23/2021      Latest Ref Rng & Units 10/23/2021   12:35 AM 10/22/2021    5:59 AM 10/21/2021    7:15 AM  CBC  WBC 4.0 - 10.5 K/uL 9.3  7.4  11.8   Hemoglobin 13.0 - 17.0 g/dL 16.1  09.6  04.5   Hematocrit 39.0 - 52.0 % 39.0  42.9  46.5   Platelets 150 - 400 K/uL 170  166  186     Lab Results  Component Value Date   HGBA1C 6.4 (H) 07/11/2017   Lab Results  Component Value Date   TSH 1.220 11/11/2016    ==================================================  COVID-19 Education: The signs and symptoms of COVID-19 were discussed with the patient and how  to seek care for testing (follow up with PCP or arrange E-visit).    I spent a total of 38 minutes with the patient spent in direct patient consultation.  Additional time spent with chart review  / charting (studies, outside notes, etc): 40 min Total Time: 78 min  Current medicines are reviewed at length with the patient today.  (+/- concerns) N/A  This visit occurred during the SARS-CoV-2 public health emergency.  Safety protocols were in place, including screening questions prior to the visit, additional usage of staff PPE, and extensive cleaning of exam room while observing appropriate contact time as indicated for disinfecting solutions.  Notice: This dictation was prepared with Dragon dictation along with smart phrase technology. Any transcriptional errors that result from this process are unintentional and may not be corrected upon review.   Studies Ordered:  Orders Placed This Encounter  Procedures   Cardiac Stress Test: Informed Consent Details: Physician/Practitioner Attestation; Transcribe to consent form and obtain patient signature   MYOCARDIAL PERFUSION IMAGING   EKG 12-Lead   No orders of the defined types were placed in this encounter.   Patient Instructions / Medication Changes & Studies & Tests Ordered   Patient Instructions  Medication Instructions:  No  changes  *If you need a refill on your cardiac medications before your next appointment, please call your pharmacy*   Lab Work: Not needed    Testing/Procedures: Schedule  at [address]  after Aug 15,2023  Your physician has requested that you have en exercise stress myoview. Please follow instruction sheet, as given.    Follow-Up: At [facility], you and your health needs are our priority.  As part of our continuing mission to provide you with exceptional heart care, we have created designated Provider Care Teams.  These Care Teams include your primary Cardiologist (physician)  and Advanced Practice Providers (APPs -  Physician Assistants and Nurse Practitioners) who all work together to provide you with the care you need, when you need it.     Your next appointment:   3 month(s)  The format for your next appointment:   In Person  Provider:   Bryan Lemma, MD    Other Instructions  [id number]   Your doctor has scheduled you for a Myocardial Perfusion scan to obtain information about the blood flow to your heart. The test consists of taking pictures of your heart in two phases: while resting and after a stress test.  The stress test will involve walking on a treadmill, or if you are unable to exercise adequately, you will be given a drug intended to have a similar effect on the heart to that of exercise.  The test will take approximately 3 to 4  hours to complete.  If you are pregnant or breastfeeding,  please notify the staff prior to your test.  How to prepare for your test: Do not eat or drink 2 hours prior to your test Do not consume products containing caffeine 12 hours prior to your test (examples: coffee (regular OR decaf), chocolate, sodas, tea) Your doctor may need you to hold certain medications prior to the test.  If so, these are listed below and should not be taken for 24 hours prior to the test.  If not listed below, you may take your medications as normal.  You may resume taking held medications on your normal schedule once the test is complete.   Meds to hold: none Do bring a list of your current medications with you.  If you have held any meds in preparation for the test, please bring them, as you may be required to take them once the test is completed. Do wear comfortable clothes and walking shoes.  Do not wear dresses or overalls. Do NOT wear cologne, perfume, aftershave, or fragranced lotions the day of your test (deodorants okay). If these instructions are not followed your test will have to be rescheduled.   A nuclear cardiologist will review  your test, prepare a report and send it to your physician.   If you have questions or concerns about your appointment, you can call the Nuclear Cardiology department at [phone]. If you cannot keep your appointment, please provide 48 hours notification to avoid a possible $50.00 charge to your account.   Please arrive 15 minutes prior to your appointment time for registration and insurance purposes     Bryan Lemma, M.D., M.S. Interventional Cardiologist   Pager # [phone] Phone # [phone] [address] [place], [place] [address]   Thank you for choosing Heartcare at [facility]!!

## 2021-11-04 ENCOUNTER — Encounter: Payer: Self-pay | Admitting: Cardiology

## 2021-11-04 NOTE — Assessment & Plan Note (Signed)
Stable blood pressure on low-dose Toprol and intermediate dose losartan and HCTZ.

## 2021-11-04 NOTE — Assessment & Plan Note (Signed)
Exertional dyspnea and with some chest discomfort concerning for possible anginal equivalent.  I truly think that he was having intermittent PE events leading up to his submassive PE.  He had multiple scattered pulmonary emboli suggesting shower emboli from distal DVT.  CT scan only showed PE, it also showed emphysema suggesting a COPD complement. It did not suggest significant coronary artery calcification.  Exercise Treadmill Myoview Stress Test-the intention is to evaluate chronotropic response while on beta-blocker and also to evaluate for ischemic CAD.

## 2021-11-04 NOTE — Assessment & Plan Note (Signed)
To clarify, he had evidence of chronotropic incompetence on increased dose of Toprol that improved on lower dose Toprol.  With having exertional dyspnea, he was referred to reassess.  Need to see his heart rate responsiveness.  To evaluate for chest pain and potential lack of heart rate responsiveness/chronotropic incompetence, will check Exercise Treadmill Myoview Stress Test.  This needs to be done while on his beta-blocker dose.  Treadmill portion needs to be completed, if not able to restart heart rate can convert to Lexiscan, but the treadmill portion needs to be completed and read by the reading physician.  Shared Decision Making/Informed Consent The risks [chest pain, shortness of breath, cardiac arrhythmias, dizziness, blood pressure fluctuations, myocardial infarction, stroke/transient ischemic attack, nausea, vomiting, allergic reaction, radiation exposure, metallic taste sensation and life-threatening complications (estimated to be 1 in 10,000)], benefits (risk stratification, diagnosing coronary artery disease, treatment guidance) and alternatives of a nuclear stress test were discussed in detail with Louis Bryan and he agrees to proceed.

## 2021-11-04 NOTE — Assessment & Plan Note (Signed)
Precordial pain with exertional dyspnea, Likely related to PE, however will exclude ischemic CAD  Plan Exercise Treadmill Myoview Stress Test to exclude ischemic CAD and chronotropic response.  We will wait for at least 2 months following the PE to check stress test to avoid exacerbation of dyspnea from the PE.

## 2021-11-04 NOTE — Assessment & Plan Note (Signed)
Acute submassive PE with bilateral segmental lobar PEs noted on CT scan.  Presentation with chest pain, diuresis and dyspnea.  Now on Eliquis for plans of 3 to 6 months.  I truthfully wonder if he potentially was having intermittent flecks of PE over the few weeks leading up to his hospitalization that were the cause of his symptoms described to the PCP.  He is on Eliquis, and with an unprovoked PE would at least do 6 months DOAC.  Can reassess hypercoagulable state after 6 months as the hypercoagulable state checked in the hospital is likely compounded by him already being on heparin.

## 2021-11-06 ENCOUNTER — Encounter (HOSPITAL_COMMUNITY): Payer: Self-pay | Admitting: Emergency Medicine

## 2021-11-06 ENCOUNTER — Emergency Department (HOSPITAL_COMMUNITY)
Admission: EM | Admit: 2021-11-06 | Discharge: 2021-11-06 | Disposition: A | Discharge status: Home / Self Care | Payer: Medicare Other | Attending: Emergency Medicine | Admitting: Emergency Medicine

## 2021-11-06 ENCOUNTER — Emergency Department (HOSPITAL_COMMUNITY): Payer: Medicare Other

## 2021-11-06 DIAGNOSIS — D72829 Elevated white blood cell count, unspecified: Secondary | ICD-10-CM | POA: Diagnosis not present

## 2021-11-06 DIAGNOSIS — Z7901 Long term (current) use of anticoagulants: Secondary | ICD-10-CM | POA: Diagnosis not present

## 2021-11-06 DIAGNOSIS — Z79899 Other long term (current) drug therapy: Secondary | ICD-10-CM | POA: Diagnosis not present

## 2021-11-06 DIAGNOSIS — I10 Essential (primary) hypertension: Secondary | ICD-10-CM | POA: Insufficient documentation

## 2021-11-06 DIAGNOSIS — R7989 Other specified abnormal findings of blood chemistry: Secondary | ICD-10-CM | POA: Insufficient documentation

## 2021-11-06 DIAGNOSIS — K5792 Diverticulitis of intestine, part unspecified, without perforation or abscess without bleeding: Secondary | ICD-10-CM | POA: Insufficient documentation

## 2021-11-06 DIAGNOSIS — R1030 Lower abdominal pain, unspecified: Secondary | ICD-10-CM | POA: Diagnosis present

## 2021-11-06 DIAGNOSIS — K4030 Unilateral inguinal hernia, with obstruction, without gangrene, not specified as recurrent: Secondary | ICD-10-CM | POA: Diagnosis not present

## 2021-11-06 DIAGNOSIS — K4090 Unilateral inguinal hernia, without obstruction or gangrene, not specified as recurrent: Secondary | ICD-10-CM

## 2021-11-06 LAB — CBC
HCT: 47.5 % (ref 39.0–52.0)
Hemoglobin: 15.4 g/dL (ref 13.0–17.0)
MCH: 30.6 pg (ref 26.0–34.0)
MCHC: 32.4 g/dL (ref 30.0–36.0)
MCV: 94.4 fL (ref 80.0–100.0)
Platelets: 223 10*3/uL (ref 150–400)
RBC: 5.03 MIL/uL (ref 4.22–5.81)
RDW: 14.3 % (ref 11.5–15.5)
WBC: 15.9 10*3/uL — ABNORMAL HIGH (ref 4.0–10.5)
nRBC: 0 % (ref 0.0–0.2)

## 2021-11-06 LAB — COMPREHENSIVE METABOLIC PANEL
ALT: 10 U/L (ref 0–44)
AST: 18 U/L (ref 15–41)
Albumin: 3.8 g/dL (ref 3.5–5.0)
Alkaline Phosphatase: 87 U/L (ref 38–126)
Anion gap: 12 (ref 5–15)
BUN: 17 mg/dL (ref 8–23)
CO2: 25 mmol/L (ref 22–32)
Calcium: 9.5 mg/dL (ref 8.9–10.3)
Chloride: 102 mmol/L (ref 98–111)
Creatinine, Ser: 1.36 mg/dL — ABNORMAL HIGH (ref 0.61–1.24)
GFR, Estimated: 51 mL/min — ABNORMAL LOW (ref >60)
Glucose, Bld: 151 mg/dL — ABNORMAL HIGH (ref 70–99)
Potassium: 4 mmol/L (ref 3.5–5.1)
Sodium: 139 mmol/L (ref 135–145)
Total Bilirubin: 1.2 mg/dL (ref 0.3–1.2)
Total Protein: 7.4 g/dL (ref 6.5–8.1)

## 2021-11-06 LAB — URINALYSIS, ROUTINE W REFLEX MICROSCOPIC
Bilirubin Urine: NEGATIVE
Glucose, UA: NEGATIVE mg/dL
Hgb urine dipstick: NEGATIVE
Ketones, ur: NEGATIVE mg/dL
Leukocytes,Ua: NEGATIVE
Nitrite: NEGATIVE
Protein, ur: NEGATIVE mg/dL
Specific Gravity, Urine: 1.018 (ref 1.005–1.030)
pH: 5 (ref 5.0–8.0)

## 2021-11-06 LAB — LIPASE, BLOOD: Lipase: 39 U/L (ref 11–51)

## 2021-11-06 MED ORDER — AMOXICILLIN-POT CLAVULANATE 875-125 MG PO TABS: 1.0000 | ORAL_TABLET | Freq: Two times a day (BID) | ORAL | 0 refills | Status: AC

## 2021-11-06 MED ORDER — HYDROCODONE-ACETAMINOPHEN 5-325 MG PO TABS: 1.0000 | ORAL_TABLET | ORAL | 0 refills | Status: DC | PRN

## 2021-11-06 MED ORDER — SODIUM CHLORIDE 0.9 % IV BOLUS
500.0000 mL | Freq: Once | INTRAVENOUS | Status: AC
  Administered 2021-11-06: 500 mL via INTRAVENOUS

## 2021-11-06 MED ORDER — AMOXICILLIN-POT CLAVULANATE 875-125 MG PO TABS: 1.0000 | ORAL_TABLET | Freq: Two times a day (BID) | ORAL | 0 refills | Status: DC

## 2021-11-06 MED ORDER — IOHEXOL 300 MG/ML  SOLN
100.0000 mL | Freq: Once | INTRAMUSCULAR | Status: AC | PRN
  Administered 2021-11-06: 100 mL via INTRAVENOUS

## 2021-11-06 NOTE — ED Notes (Signed)
Got patient into a gown on the monitor patient is resting with call bell in reach 

## 2021-11-06 NOTE — ED Triage Notes (Signed)
Patient complains of intermittent left lower abdominal pain that started yesterday and is exacerbated by movement and palpation. Patient denies history of hernia, denies changes in bowel movements. Patient is alert, oriented, ambulatory, and in no apparent distress at this time.

## 2021-11-06 NOTE — Discharge Instructions (Signed)
Follow up with your primary care provider for recheck and to discuss follow up colonoscopy per your CT report today. Schedule follow up with general surgery to discuss your inguinal hernia. Return to the ER for worsening or concerning symptoms especially fever, worsening pain, difficulty with your bowels.  Take Augmentin as prescribed and complete the full course to treat your diverticulitis (colon infection). Take Norco as needed as prescribed for pain. This medication can cause constipation, take colace and Miralax as needed as directed.

## 2021-11-06 NOTE — ED Provider Notes (Signed)
[name]   CSN: [id number] Arrival date & time: 11/06/21  0856     History  Chief Complaint  Patient presents with   Abdominal Pain    Louis Bryan is a 86 y.o. male.  86 year old male with history of hypertension, hyperlipidemia, on Eliquis for PE (from 10/2021 admission) presents with complaint of left/mid lower abdominal pain, onset yesterday. Unable to describe, worse with palpation and with sitting up, onset while he was bending over. No bulge in area noted, no testicular pain, no history of hernia or diverticulosis (does not recall last colonoscopy). Denies associated nausea, vomiting, changes in bowel or bladder habits.        Home Medications Prior to Admission medications   Medication Sig Start Date End Date Taking? Authorizing Provider  acetaminophen (TYLENOL) 500 MG tablet Take 500 mg by mouth every 6 (six) hours as needed for moderate pain.    [provider]  allopurinol (ZYLOPRIM) 100 MG tablet Take 1 tablet (100 mg total) by mouth daily. 07/11/17   Porfirio Oar, PA  amoxicillin-clavulanate (AUGMENTIN) 875-125 MG tablet Take 1 tablet by mouth every 12 (twelve) hours for 10 days. 11/06/21 11/16/21  Jeannie Fend, PA-C  APIXABAN Everlene Balls) VTE STARTER PACK (10MG  AND 5MG ) Take as directed on package: start with two-5mg  tablets twice daily for 7 days. On day 8, switch to one-5mg  tablet twice daily. 10/22/21   ., MD  Docusate Calcium (STOOL SOFTENER PO) Take 1 capsule by mouth every evening.    [provider]  HYDROcodone-acetaminophen (NORCO/VICODIN) 5-325 MG tablet Take 1 tablet by mouth every 4 (four) hours as needed. 11/06/21   Zigmund Daniel, PA-C  losartan-hydrochlorothiazide (HYZAAR) 50-12.5 MG tablet Take 1 tablet by mouth daily. 08/28/17   Jeannie Fend, PA  metoprolol succinate (TOPROL-XL) 25 MG 24 hr tablet Take 1 tablet (25 mg total) by mouth daily. with food 08/28/17    Porfirio Oar, PA  simvastatin (ZOCOR) 40 MG tablet Take 40 mg by mouth at bedtime. 09/20/21   [provider]      Allergies    Patient has no known allergies.    Review of Systems   Review of Systems Negative except as per HPI Physical Exam Updated Vital Signs BP 115/82   Pulse 88   Temp 98.6 F (37 C) (Oral)   Resp 18   SpO2 100%  Physical Exam Vitals and nursing note reviewed.  Constitutional:      General: He is not in acute distress.    Appearance: He is well-developed. He is not diaphoretic.  HENT:     Head: Normocephalic and atraumatic.  Cardiovascular:     Rate and Rhythm: Normal rate and regular rhythm.     Heart sounds: Normal heart sounds.  Pulmonary:     Effort: Pulmonary effort is normal.     Breath sounds: Normal breath sounds.  Abdominal:     Palpations: Abdomen is soft.     Tenderness: There is abdominal tenderness in the left lower quadrant. There is no right CVA tenderness or left CVA tenderness.     Hernia: A hernia is present. Hernia is present in the left inguinal area.  Musculoskeletal:     Right lower leg: No edema.     Left lower leg: No edema.  Skin:    General: Skin is warm and dry.     Findings: No erythema or rash.  Neurological:  Mental Status: He is alert and oriented to person, place, and time.  Psychiatric:        Behavior: Behavior normal.     ED Results / Procedures / Treatments   Labs (all labs ordered are listed, but only abnormal results are displayed) Labs Reviewed  COMPREHENSIVE METABOLIC PANEL - Abnormal; Notable for the following components:      Result Value   Glucose, Bld 151 (*)    Creatinine, Ser 1.36 (*)    GFR, Estimated 51 (*)    All other components within normal limits  CBC - Abnormal; Notable for the following components:   WBC 15.9 (*)    All other components within normal limits  LIPASE, BLOOD  URINALYSIS, ROUTINE W REFLEX MICROSCOPIC    EKG None  Radiology CT Abdomen Pelvis W  Contrast  Result Date: 11/06/2021 CLINICAL DATA:  Left lower quadrant abdominal pain EXAM: CT ABDOMEN AND PELVIS WITH CONTRAST TECHNIQUE: Multidetector CT imaging of the abdomen and pelvis was performed using the standard protocol following bolus administration of intravenous contrast. RADIATION DOSE REDUCTION: This exam was performed according to the departmental dose-optimization program which includes automated exposure control, adjustment of the mA and/or kV according to patient size and/or use of iterative reconstruction technique. CONTRAST:  OMNIPAQUE IOHEXOL 300 MG/ML  SOLN COMPARISON:  10/27/2012 FINDINGS: Lower chest: Mild emphysematous changes the lung bases. Mild bilateral lower lobe bronchiectasis is noted. 4 mm noncalcified nodules seen in the left posterior costophrenic sulcus is new compared to the examination from 2014 (image 42, series 5) Hepatobiliary: Status post cholecystectomy. Multiple subcentimeter hepatic hypodensities are too small to fully characterize, however most likely related to simple cysts. Pancreas: Unremarkable. No pancreatic ductal dilatation or surrounding inflammatory changes. Spleen: Normal in size without focal abnormality. Adrenals/Urinary Tract: Adrenal glands are unremarkable. Numerous bilateral simple renal cysts are seen with the largest measuring 6.3 cm. No hydronephrosis or hydroureter. Bladder is normal. Stomach/Bowel: Appendix is normal. No bowel dilatation to indicate ileus or obstruction. Extensive descending and sigmoid colon diverticulosis. Normal appendix. Focal thickening of the wall of the proximal sigmoid colon with adjacent fat stranding consistent with acute diverticulitis. No evidence of perforation or abscess. Vascular/Lymphatic: No enlarged abdominal or pelvic lymph nodes. Minimal scattered atheromatous plaque of the abdominal aorta. Reproductive: Moderately enlarged prostate. Other: Small fat containing umbilical hernia. Left inguinal hernia  containing short segment of nondilated colon and fat. Musculoskeletal: Mild compression of the T11 vertebral body is unchanged from prior examination. No acute osseous abnormality. IMPRESSION: 1. Colonic wall thickening with pericolonic fat stranding of the proximal sigmoid colon most consistent with acute diverticulitis. No evidence of perforation or abscess. Correlation with colonoscopy is strongly recommended when patient condition allows to exclude an underlying mass. 2. Moderate size left inguinal hernia containing short segment of nondilated sigmoid colon. 3. 4 mm noncalcified left lower lobe pulmonary nodule. No follow-up needed if patient is low-risk.This recommendation follows the consensus statement: Guidelines for Management of Incidental Pulmonary Nodules Detected on CT Images: From the Fleischner Society 2017; Radiology 2017; 284:228-243. Electronically Signed   By: Acquanetta Belling M.D.   On: 11/06/2021 11:20    Procedures Procedures    Medications Ordered in ED Medications  sodium chloride 0.9 % bolus 500 mL (0 mLs Intravenous Stopped 11/06/21 1241)  iohexol (OMNIPAQUE) 300 MG/ML solution 100 mL (100 mLs Intravenous Contrast Given 11/06/21 1039)    ED Course/ Medical Decision Making/ A&P  Medical Decision Making Amount and/or Complexity of Data Reviewed Labs: ordered. Radiology: ordered.  Risk Prescription drug management.   This patient presents to the ED for concern of left lower quadrant/suprapubic pain, this involves an extensive number of treatment options, and is a complaint that carries with it a high risk of complications and morbidity.  The differential diagnosis includes but not limited to diverticulitis, hernia, colitis, SBO   Co morbidities that complicate the patient evaluation  Hypertension, hyperlipidemia, PE on Eliquis   Additional history obtained:  Additional history obtained from family member at bedside External records from  outside source obtained and reviewed including recent discharge summary from October 21, 2021 for acute PE, started on Eliquis   Lab Tests:  I Ordered, and personally interpreted labs.  The pertinent results include: Mild leukocytosis white count of 15.9.  Lipase normal, CMP with glucose 151, creatinine 1.36, slightly increased compared to prior.  Urinalysis normal   Imaging Studies ordered:  I ordered imaging studies including CT abdomen pelvis with contrast I independently visualized and interpreted imaging which showed left inguinal hernia I agree with the radiologist interpretation, diverticulitis without abscess or perforation.  Moderate left inguinal hernia containing short segment of nondilated sigmoid colon.  Noncalcified left lower lobe pulmonary nodule, low risk   Problem List / ED Course / Critical interventions / Medication management  86 year old male with complaint of left lower quadrant pain.  Found to have inguinal hernia on exam which is somewhat tender however reduces with gentle pressure.  CT shows left inguinal hernia, as well as diverticulitis without abscess or perforation.  Plan is to treat with Augmentin and given pain medication.  Patient is advised to follow-up with general surgery regarding his inguinal hernia.  Discussed return precautions.  In regards to his diverticulitis, he is treated with Augmentin, recommend recheck with his PCP.  CT suggest follow-up with colonoscopy. I ordered medication including IV fluids for hydration post CT with contrast given his slight increase in creatinine today. Reevaluation of the patient after these medicines showed that the patient stayed the same I have reviewed the patients home medicines and have made adjustments as needed   Social Determinants of Health:  Lives with family, has PCP for follow-up   Test / Admission - Considered:  Consider admission for diverticulitis with my leukocytosis, patient is agreeable for  discharge home at this time with return precautions.  He is afebrile.         Final Clinical Impression(s) / ED Diagnoses Final diagnoses:  Diverticulitis  Unilateral inguinal hernia without obstruction or gangrene, recurrence not specified    Rx / DC Orders ED Discharge Orders          Ordered    amoxicillin-clavulanate (AUGMENTIN) 875-125 MG tablet  Every 12 hours,   Status:  Discontinued        11/06/21 1216    HYDROcodone-acetaminophen (NORCO/VICODIN) 5-325 MG tablet  Every 4 hours PRN,   Status:  Discontinued        11/06/21 1216    amoxicillin-clavulanate (AUGMENTIN) 875-125 MG tablet  Every 12 hours        11/06/21 1223    HYDROcodone-acetaminophen (NORCO/VICODIN) 5-325 MG tablet  Every 4 hours PRN        11/06/21 1223              Jeannie Fend, PA-C 11/06/21 1414    Cheryll Cockayne, MD 11/07/21 [phone]

## 2021-11-11 ENCOUNTER — Encounter (INDEPENDENT_AMBULATORY_CARE_PROVIDER_SITE_OTHER): Payer: Medicare Other | Admitting: Ophthalmology

## 2021-11-11 ENCOUNTER — Telehealth (HOSPITAL_COMMUNITY): Payer: Self-pay

## 2021-11-11 NOTE — Telephone Encounter (Signed)
Pharmacy Transitions of Care Follow-up Telephone Call  Date of discharge: 10/23/2021  Discharge Diagnosis: Aute PE  How have you been since you were released from the hospital? Patient is doing well since discharge. He does not have any questions or concerns regarding medications.   Medication changes made at discharge:  START taking: Eliquis DVT/PE Starter Pack (Apixaban Starter Pack (10mg  and 5mg ))   STOP taking: aspirin 81 MG tablet   Medication changes verified by the patient? Yes   Medication Accessibility:  Home Pharmacy: [facility]    Was the patient provided with refills on discharged medications? No   Have all prescriptions been transferred from [facility] to home pharmacy? Yes   Is the patient able to afford medications? Has insurance, [facility] Medicare  Medication Review:   APIXABAN (ELIQUIS)  Apixaban 10 mg BID initiated on 10/22/2021. Switched to apixaban 5 mg BID 10/29/2021 - Discussed importance of taking medication around the same time everyday  - Advised patient of medications to avoid (NSAIDs, ASA)  - Educated that Tylenol (acetaminophen) will be the preferred analgesic to prevent risk of bleeding  - Emphasized importance of monitoring for signs and symptoms of bleeding (abnormal bruising, prolonged bleeding, nose bleeds, bleeding from gums, discolored urine, black tarry stools)  - Advised patient to alert all providers of anticoagulation therapy prior to starting a new medication or having a procedure   Follow-up Appointments:  PCP Hospital f/u appt confirmed? Seen 10/24/2021, PA on 10/29/2021. Scheduled to see Porfirio Oar, PA on 11/23/2021 @ 9:40 AM.   Specialist Hospital f/u appt confirmed? Seen Porfirio Oar, MD on 11/03/2021.   If their condition worsens, is the pt aware to call PCP or go to the Emergency Dept.? Yes  Final Patient Assessment: Patient plans to follow-up with PCP 11/23/2021 to get refills and send them to his home pharmacy.

## 2021-12-23 ENCOUNTER — Telehealth (HOSPITAL_COMMUNITY): Payer: Self-pay | Admitting: *Deleted

## 2021-12-23 NOTE — Telephone Encounter (Signed)
Patient given detailed instructions per Myocardial Perfusion Study Information Sheet for the test on 12/29/21 Patient notified to arrive 15 minutes early and that it is imperative to arrive on time for appointment to keep from having the test rescheduled.  If you need to cancel or reschedule your appointment, please call the office within 24 hours of your appointment. . Patient verbalized understanding.  Patient informed to take all his medications as instructed per Dr. Herbie Baltimore. Ricky Ala

## 2021-12-29 ENCOUNTER — Ambulatory Visit (HOSPITAL_COMMUNITY): Payer: Medicare Other | Attending: Cardiovascular Disease

## 2021-12-29 DIAGNOSIS — R072 Precordial pain: Secondary | ICD-10-CM | POA: Diagnosis not present

## 2021-12-29 DIAGNOSIS — R0609 Other forms of dyspnea: Secondary | ICD-10-CM | POA: Diagnosis not present

## 2021-12-29 DIAGNOSIS — I4589 Other specified conduction disorders: Secondary | ICD-10-CM | POA: Diagnosis not present

## 2021-12-29 HISTORY — PX: NM MYOVIEW LTD: HXRAD82

## 2021-12-29 LAB — MYOCARDIAL PERFUSION IMAGING
Angina Index: 0
Duke Treadmill Score: 5
Estimated workload: 6.8
Exercise duration (min): 5 min
Exercise duration (sec): 21 s
LV dias vol: 65 mL (ref 62–150)
LV sys vol: 26 mL
MPHR: 136 {beats}/min
Nuc Stress EF: 61 %
Peak HR: 126 {beats}/min
Percent HR: 92 %
Rest HR: 66 {beats}/min
Rest Nuclear Isotope Dose: 10.7 mCi
SDS: 1
SRS: 0
SSS: 1
ST Depression (mm): 0 mm
Stress Nuclear Isotope Dose: 30.4 mCi
TID: 0.98

## 2021-12-29 MED ORDER — TECHNETIUM TC 99M TETROFOSMIN IV KIT
10.7000 | PACK | Freq: Once | INTRAVENOUS | Status: AC | PRN
  Administered 2021-12-29: 10.7 via INTRAVENOUS

## 2021-12-29 MED ORDER — TECHNETIUM TC 99M TETROFOSMIN IV KIT
30.4000 | PACK | Freq: Once | INTRAVENOUS | Status: AC | PRN
  Administered 2021-12-29: 30.4 via INTRAVENOUS

## 2022-01-04 ENCOUNTER — Encounter (INDEPENDENT_AMBULATORY_CARE_PROVIDER_SITE_OTHER): Payer: Medicare Other | Admitting: Ophthalmology

## 2022-01-19 ENCOUNTER — Encounter (INDEPENDENT_AMBULATORY_CARE_PROVIDER_SITE_OTHER): Payer: Self-pay | Admitting: Ophthalmology

## 2022-01-19 ENCOUNTER — Ambulatory Visit (INDEPENDENT_AMBULATORY_CARE_PROVIDER_SITE_OTHER): Payer: Medicare Other | Admitting: Ophthalmology

## 2022-01-19 DIAGNOSIS — H43821 Vitreomacular adhesion, right eye: Secondary | ICD-10-CM | POA: Diagnosis not present

## 2022-01-19 DIAGNOSIS — H35372 Puckering of macula, left eye: Secondary | ICD-10-CM

## 2022-01-19 DIAGNOSIS — H33102 Unspecified retinoschisis, left eye: Secondary | ICD-10-CM

## 2022-01-19 NOTE — Progress Notes (Signed)
01/19/2022     CHIEF COMPLAINT Patient presents for  Chief Complaint  Patient presents with   Retina Follow Up      HISTORY OF PRESENT ILLNESS: Louis Bryan is a 86 y.o. male who presents to the clinic today for:   HPI     Retina Follow Up           Diagnosis: Other   Laterality: left eye   Severity: moderate   Course: stable         Comments   12 weeks FU OS OCT. Pt stated vision has remained stable since last visit.       Last edited by Silvestre Moment on 01/19/2022  9:02 AM.      Referring physician: Harrison Mons, [address] [place],  [place] [address]  HISTORICAL INFORMATION:   Selected notes from the MEDICAL RECORD NUMBER    Lab Results  Component Value Date   HGBA1C 6.4 (H) 07/11/2017     CURRENT MEDICATIONS: No current outpatient medications on file. (Ophthalmic Drugs)   No current facility-administered medications for this visit. (Ophthalmic Drugs)   Current Outpatient Medications (Other)  Medication Sig   acetaminophen (TYLENOL) 500 MG tablet Take 500 mg by mouth every 6 (six) hours as needed for moderate pain.   allopurinol (ZYLOPRIM) 100 MG tablet Take 1 tablet (100 mg total) by mouth daily.   APIXABAN (ELIQUIS) VTE STARTER PACK (10MG  AND 5MG ) Take as directed on package: start with two-5mg  tablets twice daily for 7 days. On day 8, switch to one-5mg  tablet twice daily.   Docusate Calcium (STOOL SOFTENER PO) Take 1 capsule by mouth every evening.   HYDROcodone-acetaminophen (NORCO/VICODIN) 5-325 MG tablet Take 1 tablet by mouth every 4 (four) hours as needed.   losartan-hydrochlorothiazide (HYZAAR) 50-12.5 MG tablet Take 1 tablet by mouth daily.   metoprolol succinate (TOPROL-XL) 25 MG 24 hr tablet Take 1 tablet (25 mg total) by mouth daily. with food   simvastatin (ZOCOR) 40 MG tablet Take 40 mg by mouth at bedtime.   No current facility-administered medications for this visit. (Other)      REVIEW OF SYSTEMS: ROS    Negative for: Constitutional, Gastrointestinal, Neurological, Skin, Genitourinary, Musculoskeletal, HENT, Endocrine, Cardiovascular, Eyes, Respiratory, Psychiatric, Allergic/Imm, Heme/Lymph Last edited by Silvestre Moment on 01/19/2022  9:02 AM.       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   History of exercise stress test [id number]   negative bruce protocol excercise stress test with scintigraphic evidence of diaphragmatic attenuation and mild apical thinning, dynamic gating was not performed secondary to frequent ectopy, low risk study   Hyperlipidemia    Hypertension    Shortness of breath    On exertion   Tobacco abuse    Past Surgical History:  Procedure Laterality Date   CATARACT EXTRACTION Left 07/2016   CHOLECYSTECTOMY N/A 10/27/2012   Procedure: LAPAROSCOPIC CHOLECYSTECTOMY WITH INTRAOPERATIVE CHOLANGIOGRAM;  Surgeon: Leighton Ruff, MD;  Location: [facility];  Service: General;  Laterality: N/A;    FAMILY HISTORY Family History  Problem Relation Age of Onset   Diabetes Sister    Diabetes Sister     SOCIAL HISTORY Social History   Tobacco Use   Smoking status: Former    Types: Cigarettes    Quit date: 06/13/1992    Years since quitting: 29.6   Smokeless tobacco: Never  Vaping Use  Vaping Use: Never used  Substance Use Topics   Alcohol use: No   Drug use: No         OPHTHALMIC EXAM:  Base Eye Exam     Visual Acuity (ETDRS)       Right Left   Dist [place] 20/40 20/100   Dist ph [place] NI 20/70 -2         Tonometry (Tonopen, 9:07 AM)       Right Left   Pressure 12 14         Pupils       Pupils APD   Right PERRL None   Left PERRL None         Visual Fields       Left Right    Full Full         Extraocular Movement       Right Left    Full, Ortho Full, Ortho         Neuro/Psych     Oriented x3: Yes   Mood/Affect: Normal          Dilation     Left eye: 2.5% Phenylephrine, 1.0% Mydriacyl @ 9:06 AM           Slit Lamp and Fundus Exam     External Exam       Right Left   External Normal Normal         Slit Lamp Exam       Right Left   Lids/Lashes Normal Normal   Conjunctiva/Sclera White and quiet White and quiet   Cornea Clear Clear   Anterior Chamber Deep and quiet Deep and quiet   Iris Round and reactive Round and reactive   Lens Posterior chamber intraocular lens Posterior chamber intraocular lens   Anterior Vitreous Normal Normal         Fundus Exam       Right Left   Posterior Vitreous Partial posterior vitreous detachment,  Vitrectomized   Disc Normal Splinter hemorrhage at superior pole of nerve   C/D Ratio 0.65 0.65   Macula Subtle color change to the RPE in the foveal region.  Negative Watzke. Pseudocystoid change in the fovea, Retinal pigment epithelial mottling, no hemorrhage, no macular thickening   Vessels Normal, no DR Normal, no DR   Periphery Normal Normal            IMAGING AND PROCEDURES  Imaging and Procedures for 01/19/22  OCT, Retina - OU - Both Eyes       Right Eye Quality was good. Scan locations included subfoveal. Central Foveal Thickness: 271. Progression has improved. Findings include abnormal foveal contour, retinal drusen , vitreous traction, vitreomacular adhesion .   Left Eye Quality was good. Scan locations included subfoveal. Central Foveal Thickness: 407. Progression has improved. Findings include abnormal foveal contour, epiretinal membrane, macular pucker.   Notes OD with outer retinal drusenoid deposit in the subfoveal location likely secondary to vitreous traction on the inner retina, this has improved over the last 3 years OD, with less disturbance to the photoreceptor and ellipsoid layers.  Very minor inner foveal torsion from minor residual VMT, will continue to monitor OD with observation   OS, massive improvement status post  vitrectomy for vitreomacular traction detachment of the left eye with severe retinoschisis and serous retinal detachment February 2018, preoperative visual acuity of 20/400 has now improved to 20/70, and the photoreceptor layer appears to be improving but slowly however now with epiretinal membrane reformation  triggering vision loss OS progression of schisis and epiretinal membrane OS                  ASSESSMENT/PLAN:  Vitreomacular adhesion of right eye Foveal traction is diminished with attendant outer photoreceptor layer change with small drusenoid deposit vastly improved.  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 Vitreomacular adhesion of right eye  H43.821 OCT, Retina - OU - Both Eyes    2. Epiretinal membrane, left eye  H35.372     3. Macular retinoschisis, left  H33.102       1.  OU doing well stable acuity and improved macular condition in the right eye.  2.  Overall improved macular condition left eye from severity severe VMT with tractional change inducing retinoschisis.  Stable with good acuity maintained and retained  3.  Ophthalmic Meds Ordered this visit:  No orders of the defined types were placed in this encounter.      Return in about 1 year (around 01/20/2023) for DILATE OU, COLOR FP, OCT.  There are no Patient Instructions on file for this visit.   Explained the diagnoses, plan, and follow up with the patient and they expressed understanding.  Patient expressed understanding of the importance of proper follow up care.   Alford Highland Avonne Berkery M.D. Diseases & Surgery of the Retina and Vitreous [facility] 01/19/22     Abbreviations: M myopia (nearsighted); A astigmatism; H hyperopia  (farsighted); P presbyopia; Mrx spectacle prescription;  CTL contact lenses; OD right eye; OS left eye; OU both eyes  XT exotropia; ET esotropia; PEK punctate epithelial keratitis; PEE punctate epithelial erosions; DES dry eye syndrome; MGD meibomian gland dysfunction; ATs artificial tears; PFAT's preservative free artificial tears; NSC nuclear sclerotic cataract; PSC posterior subcapsular cataract; ERM epi-retinal membrane; PVD posterior vitreous detachment; RD retinal detachment; DM diabetes mellitus; DR diabetic retinopathy; NPDR non-proliferative diabetic retinopathy; PDR proliferative diabetic retinopathy; CSME clinically significant macular edema; DME diabetic macular edema; dbh dot blot hemorrhages; CWS cotton wool spot; POAG primary open angle glaucoma; C/D cup-to-disc ratio; HVF humphrey visual field; GVF goldmann visual field; OCT optical coherence tomography; IOP intraocular pressure; BRVO Branch retinal vein occlusion; CRVO central retinal vein occlusion; CRAO central retinal artery occlusion; BRAO branch retinal artery occlusion; RT retinal tear; SB scleral buckle; PPV pars plana vitrectomy; VH Vitreous hemorrhage; PRP panretinal laser photocoagulation; IVK intravitreal kenalog; VMT vitreomacular traction; MH Macular hole;  NVD neovascularization of the disc; NVE neovascularization elsewhere; AREDS age related eye disease study; ARMD age related macular degeneration; POAG primary open angle glaucoma; EBMD epithelial/anterior basement membrane dystrophy; ACIOL anterior chamber intraocular lens; IOL intraocular lens; PCIOL posterior chamber intraocular lens; Phaco/IOL phacoemulsification with intraocular lens placement; PRK photorefractive keratectomy; LASIK laser assisted in situ keratomileusis; HTN hypertension; DM diabetes mellitus; COPD chronic obstructive pulmonary disease

## 2022-01-19 NOTE — Assessment & Plan Note (Signed)
Foveal traction is diminished with attendant outer photoreceptor layer change with small drusenoid deposit vastly improved.

## 2022-01-19 NOTE — Assessment & Plan Note (Signed)
Minor OS may be causing tangential traction but overall vastly improved post vitrectomy membrane peel

## 2022-01-19 NOTE — Assessment & Plan Note (Signed)
Very severe at onset vastly improved today and residual from previous tractional changes from VMT and ERM OS

## 2022-01-24 ENCOUNTER — Encounter: Payer: Self-pay | Admitting: Cardiology

## 2022-01-24 ENCOUNTER — Ambulatory Visit: Payer: Medicare Other | Attending: Cardiology | Admitting: Cardiology

## 2022-01-24 VITALS — BP 130/70 | HR 88 | Ht 66.0 in | Wt 181.4 lb

## 2022-01-24 DIAGNOSIS — I4589 Other specified conduction disorders: Secondary | ICD-10-CM | POA: Diagnosis not present

## 2022-01-24 DIAGNOSIS — E782 Mixed hyperlipidemia: Secondary | ICD-10-CM | POA: Diagnosis not present

## 2022-01-24 DIAGNOSIS — R072 Precordial pain: Secondary | ICD-10-CM

## 2022-01-24 DIAGNOSIS — I2609 Other pulmonary embolism with acute cor pulmonale: Secondary | ICD-10-CM | POA: Diagnosis not present

## 2022-01-24 DIAGNOSIS — R0609 Other forms of dyspnea: Secondary | ICD-10-CM

## 2022-01-24 DIAGNOSIS — I10 Essential (primary) hypertension: Secondary | ICD-10-CM | POA: Diagnosis not present

## 2022-01-24 NOTE — Patient Instructions (Signed)
Medication Instructions:  No changes  *If you need a refill on your cardiac medications before your next appointment, please call your pharmacy*   Lab Work:  Not needed     Testing/Procedures:  Not needed  Follow-Up: At [facility], you and your health needs are our priority.  As part of our continuing mission to provide you with exceptional heart care, we have created designated Provider Care Teams.  These Care Teams include your primary Cardiologist (physician) and Advanced Practice Providers (APPs -  Physician Assistants and Nurse Practitioners) who all work together to provide you with the care you need, when you need it.  We recommend signing up for the patient portal called "MyChart".  Sign up information is provided on this After Visit Summary.  MyChart is used to connect with patients for Virtual Visits (Telemedicine).  Patients are able to view lab/test results, encounter notes, upcoming appointments, etc.  Non-urgent messages can be sent to your provider as well.   To learn more about what you can do with MyChart, go to [email or web address].    Your next appointment:   12 month(s)  The format for your next appointment:   In Person  Provider:   Glenetta Hew, MD    Other Instructions   *If you need a refill on your cardiac medications before your next appointment, please call your pharmacy*   Lab Work:    If you have labs (blood work) drawn today and your tests are completely normal, you will receive your results only by: [place] (if you have MyChart) OR A paper copy in the mail If you have any lab test that is abnormal or we need to change your treatment, we will call you to review the results.   Testing/Procedures:    Follow-Up: At [facility], you and your health needs are our priority.  As part of our continuing mission to provide you with exceptional heart care, we have created designated Provider Care Teams.  These Care Teams  include your primary Cardiologist (physician) and Advanced Practice Providers (APPs -  Physician Assistants and Nurse Practitioners) who all work together to provide you with the care you need, when you need it.  We recommend signing up for the patient portal called "MyChart".  Sign up information is provided on this After Visit Summary.  MyChart is used to connect with patients for Virtual Visits (Telemedicine).  Patients are able to view lab/test results, encounter notes, upcoming appointments, etc.  Non-urgent messages can be sent to your provider as well.   To learn more about what you can do with MyChart, go to [email or web address].    Your next appointment:   12 month(s)  The format for your next appointment:   In Person  Provider:   Glenetta Hew, MD

## 2022-01-24 NOTE — Progress Notes (Signed)
The  Primary Care Provider: Porfirio Oar, PA [facility] Cardiologist: Bryan Lemma, MD Electrophysiologist: None  Clinic Note: Chief Complaint  Patient presents with   2-73-month follow-up    Discussed test results.  Overall feeling better.    ===================================  ASSESSMENT/PLAN   Problem List Items Addressed This Visit       Cardiology Problems   Essential hypertension (Chronic)    Stable BP today on current medications.  No change.  Remains on low-dose Toprol with no signs of chronotropic incompetence.  He is also on moderate dose losartan-HCTZ.  Continue current meds.      Relevant Medications   apixaban (ELIQUIS) 5 MG TABS tablet   Hyperlipidemia (Chronic)    Remains on simvastatin 40 mg daily.  Last labs showed LDL of 65 in April.  Follow-up with PCP.  No change.      Relevant Medications   apixaban (ELIQUIS) 5 MG TABS tablet   Pulmonary embolism (HCC) (Chronic)    Acute submassive PE with bilateral segmental lobar PEs on CT scan.  Has been on Eliquis now for ~3 months --given extent of PE, I would probably consider at least 1 year of full DOAC.  Once that is complete, would then convert to aspirin.      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  Was able to get his heart rate at 126 bpm on treadmill.  He is no longer chronotropically incompetent.      DOE (dyspnea on exertion)    Certainly COPD is probably related to emphysema seen on CT scan but also still recovering from PE.      Precordial pain    Probably still related to PE.What he is noticing now is more of fasciculations in his chest.  Not having any true anginal type symptoms.  Myoview was negative for ischemia.      ===================================  HPI:    Louis Bryan is a 86 y.o. male with a PMH notable for HTN, HLD, OSA on  CPAP, and emphysema (by CT), previously described Medication Related Chronotropic Incompetence-better with low-dose Toprol), and recent some massive bilateral PE who presents today for borderline 36-month follow-up evaluation of exertional dyspnea, chest tightness and possible contribution.Louis Bryan was seen on November 03, 2021 for initial consultation (had not been seen since April 2017 -> we had reduced his Toprol dose because of possible quadruplet incompetence.  He is doing much better.  Walking a mile every day could.  Berry to 3 days a week.).  On this visit he was accompanied by his daughter who helped out with his history.  He said he feels a lot better since his discharge for PE.  Still has some exertional dyspnea.  Has noticed progressively worsening exertional dyspnea and easy fatigability prior to his PE as well as some intermittent chest pain.  We therefore decided to evaluate with a Myoview since he is already had an echocardiogram.  He denies any PND orthopnea or edema.  Was doing well with Eliquis.  No bleeding issues.  No irregular heartbeats palpitations.  Recent Hospitalizations: N/A  Reviewed  CV studies:    The following studies were reviewed today: (if available, images/films reviewed: From Epic Chart or Care Everywhere) MYOVIEW 12/29/21: The study is normal. The study is low risk.   No ST deviation was noted.  Left ventricular function is normal. Nuclear stress EF: 61 %. The left ventricular ejection fraction is normal (55-65%). End diastolic cavity size is normal.   Prior study available for comparison from 12/11/2003. No changes compared to prior study. 10/22/2021: Bilateral LE Dopplers: No DVT 10/22/2021: Normal LV size and function.  EF 60 to 65%.  GR 1 DD.  Normal RV size and function.  Normal RSVP.  Normal RAP.  Normal MV with mild AOV sclerosis with no stenosis.  Interval History:   Louis Bryan returns today to discuss results of his stress test.  He says he is doing fairly  well.  He said he may get little bit tired or short of breath if he increases his level exertion.  He says is really more fatigued than it is dyspnea or chest pain.  He really does not notice exertional dyspnea and also no chest pain.  No PND, orthopnea, or orthopnea.  Energy level is great gradually getting better.  Breathing better now than he has since his PE.  What he describes now as a quivering sensation in his chest that is mostly "muscle is just quivering",but no real palpitations.  Nothing like irregular heartbeats or palpitations.  CV Review of Symptoms (Summary): no chest pain or dyspnea on exertion positive for - quivering in the chest-not palpitations; exercise intolerance/fatigue but not dyspnea or chest pain. negative for - chest pain, edema, irregular heartbeat, orthopnea, palpitations, paroxysmal nocturnal dyspnea, rapid heart rate, shortness of breath, or syncope or near syncope, TIA/amaurosis fugax or claudication.  REVIEWED OF SYSTEMS   Review of Systems  Constitutional:  Positive for malaise/fatigue (Just exercise fatigue (but not noted to have chronotropic incompetence on treadmill). Negative for weight loss.  HENT:  Negative for congestion.   Respiratory:         Per HPI  Cardiovascular:        Per HPI  Gastrointestinal:  Negative for blood in stool and melena.  Genitourinary:  Negative for dysuria and hematuria.  Musculoskeletal:  Positive for joint pain. Negative for back pain.  Neurological:  Positive for dizziness (Sometimes positional). Negative for focal weakness, loss of consciousness and headaches.  Psychiatric/Behavioral:  Positive for memory loss. Negative for depression. The patient is not nervous/anxious and does not have insomnia.    I have reviewed and (if needed) personally updated the patient's problem list, medications, allergies, past medical and surgical history, social and family history.   PAST MEDICAL HISTORY   Past Medical History:  Diagnosis  Date   Chronotropic incompetence - medication related 09/24/2012   History of exercise stress test [id number]   negative bruce protocol excercise stress test with scintigraphic evidence of diaphragmatic attenuation and mild apical thinning, dynamic gating was not performed secondary to frequent ectopy, low risk study   Hyperlipidemia    Hypertension    Pulmonary embolism, bilateral ([place]) 10/21/2021   CTA chest: 1. Positive for acute pulmonary embolus with CT evidence of right   Shortness of breath    On exertion   Tobacco abuse     PAST SURGICAL HISTORY   Past Surgical History:  Procedure Laterality Date   CATARACT EXTRACTION Left 07/2016   CHOLECYSTECTOMY N/A 10/27/2012   Procedure: LAPAROSCOPIC CHOLECYSTECTOMY WITH INTRAOPERATIVE CHOLANGIOGRAM;  Surgeon: Leighton Ruff, MD;  Location: [facility];  Service: General;  Laterality: N/A;   NM MYOVIEW LTD  12/29/2021   The study is normal. The study is low risk.   No ST deviation was noted => rate related RBBB, peak  HR 126 bpm-no chronotropic incompetence.    Left ventricular function is normal. Nuclear stress EF: 61 %. The left ventricular ejection fraction is normal (55-65%). End diastolic cavity size is normal.   Prior study available for comparison from 12/11/2003. No changes compared to prior study.   TRANSTHORACIC ECHOCARDIOGRAM  10/22/2021   Normal LV size and function.  EF 60 to 65%.  GR 1 DD.  Normal RV size and function.  Normal RSVP.  Normal RAP.  Normal MV with mild AOV sclerosis with no stenosis.    Immunization History  Administered Date(s) Administered   Influenza,inj,Quad PF,6+ Mos 01/22/2013, 05/27/2014, 04/07/2015, 02/14/2017   Influenza-Unspecified 03/21/2016   PFIZER Comirnaty(Gray Top)Covid-19 Tri-Sucrose Vaccine 11/20/2020   PFIZER(Purple Top)SARS-COV-2 Vaccination 06/04/2019, 06/25/2019, 03/30/2020   Pfizer Covid-19 Vaccine Bivalent Booster 5834yrs & up 02/23/2021   Pneumococcal Conjugate-13 11/26/2013    Pneumococcal-Unspecified 09/29/2007   Tdap 11/10/2009   Zoster Recombinat (Shingrix) 07/29/2016, 09/27/2016    MEDICATIONS/ALLERGIES   Current Meds  Medication Sig   acetaminophen (TYLENOL) 500 MG tablet Take 500 mg by mouth every 6 (six) hours as needed for moderate pain.   allopurinol (ZYLOPRIM) 100 MG tablet Take 1 tablet (100 mg total) by mouth daily.   apixaban (ELIQUIS) 5 MG TABS tablet Take 5 mg by mouth 2 (two) times daily.   Docusate Calcium (STOOL SOFTENER PO) Take 1 capsule by mouth every evening.   losartan-hydrochlorothiazide (HYZAAR) 50-12.5 MG tablet Take 1 tablet by mouth daily.   metoprolol succinate (TOPROL-XL) 25 MG 24 hr tablet Take 1 tablet (25 mg total) by mouth daily. with food   simvastatin (ZOCOR) 40 MG tablet Take 40 mg by mouth at bedtime.    No Known Allergies  SOCIAL HISTORY/FAMILY HISTORY   Reviewed in Epic:  Pertinent findings:  Social History   Tobacco Use   Smoking status: Former    Types: Cigarettes    Quit date: 06/13/1992    Years since quitting: 29.6   Smokeless tobacco: Never  Vaping Use   Vaping Use: Never used  Substance Use Topics   Alcohol use: No   Drug use: No   Social History   Social History Narrative   He cuts his grass with a push mover in the summer time for exercise. He wants to start back walking.    His daughter lives with him.   One sister lives locally.    OBJCTIVE -PE, EKG, labs   Wt Readings from Last 3 Encounters:  01/24/22 181 lb 6.4 oz (82.3 kg)  12/29/21 180 lb (81.6 kg)  11/03/21 180 lb (81.6 kg)    Physical Exam: BP 130/70 (BP Location: Left Arm, Patient Position: Sitting, Cuff Size: Normal)   Pulse 88   Ht 5\' 6"  (1.676 m)   Wt 181 lb 6.4 oz (82.3 kg)   BMI 29.28 kg/m  Physical Exam Vitals reviewed.  Constitutional:      General: He is not in acute distress.    Appearance: Normal appearance. He is obese. He is not ill-appearing (Healthy-appearing) or toxic-appearing.     Comments: Borderline  obese.  Well-nourished well-groomed.  HENT:     Head: Normocephalic and atraumatic.     Ears:     Comments: Very hard of hearing Neck:     Vascular: No carotid bruit.  Cardiovascular:     Rate and Rhythm: Normal rate and regular rhythm.     Pulses: Normal pulses.     Heart sounds: Murmur (Cannot exclude soft SEM at LUSB.) heard.  No friction rub. No gallop.  Pulmonary:     Effort: Pulmonary effort is normal. No respiratory distress.     Breath sounds: Normal breath sounds. No wheezing, rhonchi or rales.  Chest:     Chest wall: No tenderness.  Musculoskeletal:        General: No swelling. Normal range of motion.     Cervical back: Normal range of motion and neck supple.  Skin:    General: Skin is warm and dry.  Neurological:     General: No focal deficit present.     Mental Status: He is alert and oriented to person, place, and time.     Gait: Gait abnormal.  Psychiatric:        Mood and Affect: Mood normal.        Behavior: Behavior normal.        Thought Content: Thought content normal.        Judgment: Judgment normal.     Comments: Poor historian.     Adult ECG Report Not checked  Recent Labs: Reviewed POCT A1C Component 08/24/21  05/26/21  02/23/21   Hemoglobin A1c 6.2 Abnormal  6.2 Abnormal  6.0 Abnormal   Lipid Panel With LDL/HDL Ratio Component 08/24/21  05/26/21  02/23/21  11/20/20   Cholesterol, Total 143 142 139 130  Triglycerides 106 136 113 115  HDL 59 50 54 51  VLDL Cholesterol Cal LDL 65 68 65 58    Lab Results  Component Value Date   CREATININE 1.36 (H) 11/06/2021   BUN 17 11/06/2021   NA 139 11/06/2021   K 4.0 11/06/2021   CL 102 11/06/2021   CO2 25 11/06/2021      Latest Ref Rng & Units 11/06/2021    9:10 AM 10/23/2021   12:35 AM 10/22/2021    5:59 AM  CBC  WBC 4.0 - 10.5 K/uL 15.9  9.3  7.4   Hemoglobin 13.0 - 17.0 g/dL 16.1  09.6  04.5   Hematocrit 39.0 - 52.0 % 47.5  39.0  42.9   Platelets 150 - 400 K/uL 223  170  166      Lab Results  Component Value Date   HGBA1C 6.4 (H) 07/11/2017   Lab Results  Component Value Date   TSH 1.220 11/11/2016    ================================================== I spent a total of 12 minutes with the patient spent in direct patient consultation.  Additional time spent with chart review  / charting (studies, outside notes, etc): 13 min Total Time: 25 min  Current medicines are reviewed at length with the patient today.  (+/- concerns) N/A  Notice: This dictation was prepared with Dragon dictation along with smart phrase technology. Any transcriptional errors that result from this process are unintentional and may not be corrected upon review.  Studies Ordered:   No orders of the defined types were placed in this encounter.  No orders of the defined types were placed in this encounter.   Patient Instructions / Medication Changes & Studies & Tests Ordered   Patient Instructions  Medication Instructions:  No changes  *If you need a refill on your cardiac medications before your next appointment, please call your pharmacy*   Lab Work:  Not needed     Testing/Procedures:  Not needed  Follow-Up: At [facility], you and your health needs are our priority.  As part of our continuing mission to provide you with exceptional heart care, we have created designated Provider Care Teams.  These Care Teams include your primary Cardiologist (physician) and Advanced Practice Providers (APPs -  Physician Assistants and Nurse Practitioners) who all work together to provide you with the care you need, when you need it.  We recommend signing up for the patient portal called "MyChart".  Sign up information is provided on this After Visit Summary.  MyChart is used to connect with patients for Virtual Visits (Telemedicine).  Patients are able to view lab/test results, encounter notes, upcoming appointments, etc.  Non-urgent messages can be sent to your provider as well.    To learn more about what you can do with MyChart, go to [email or web address].    Your next appointment:   12 month(s)  The format for your next appointment:   In Person  Provider:   Bryan Lemma, MD    Other Instructions   *If you need a refill on your cardiac medications before your next appointment, please call your pharmacy*   Lab Work:    If you have labs (blood work) drawn today and your tests are completely normal, you will receive your results only by: MyChart Message (if you have MyChart) OR A paper copy in the mail If you have any lab test that is abnormal or we need to change your treatment, we will call you to review the results.   Testing/Procedures:    Follow-Up: At [facility], you and your health needs are our priority.  As part of our continuing mission to provide you with exceptional heart care, we have created designated Provider Care Teams.  These Care Teams include your primary Cardiologist (physician) and Advanced Practice Providers (APPs -  Physician Assistants and Nurse Practitioners) who all work together to provide you with the care you need, when you need it.  We recommend signing up for the patient portal called "MyChart".  Sign up information is provided on this After Visit Summary.  MyChart is used to connect with patients for Virtual Visits (Telemedicine).  Patients are able to view lab/test results, encounter notes, upcoming appointments, etc.  Non-urgent messages can be sent to your provider as well.   To learn more about what you can do with MyChart, go to [email or web address].    Your next appointment:   12 month(s)  The format for your next appointment:   In Person  Provider:   Bryan Lemma, MD         [name], MD, MS Bryan Lemma, M.D., M.S. Interventional Cardiologist  [facility]  Pager # [phone] Phone # [phone] [address] [place], [place] [address]   Thank you  for choosing  [facility]!!

## 2022-02-06 ENCOUNTER — Encounter: Payer: Self-pay | Admitting: Cardiology

## 2022-02-06 NOTE — Assessment & Plan Note (Signed)
Probably still related to PE.What he is noticing now is more of fasciculations in his chest.  Not having any true anginal type symptoms.  Myoview was negative for ischemia.

## 2022-02-06 NOTE — Addendum Note (Signed)
Addended by: Leonie Man on: 02/06/2022 07:54 PM   Modules accepted: Level of Service

## 2022-02-06 NOTE — Assessment & Plan Note (Addendum)
Acute submassive PE with bilateral segmental lobar PEs on CT scan.  Has been on Eliquis now for ~3 months --given extent of PE, I would probably consider at least 1 year of full DOAC.  Once that is complete, would then convert to aspirin.

## 2022-02-06 NOTE — Assessment & Plan Note (Signed)
Certainly COPD is probably related to emphysema seen on CT scan but also still recovering from PE.

## 2022-02-06 NOTE — Assessment & Plan Note (Signed)
Stable BP today on current medications.  No change.  Remains on low-dose Toprol with no signs of chronotropic incompetence.  He is also on moderate dose losartan-HCTZ.  Continue current meds.

## 2022-02-06 NOTE — Assessment & Plan Note (Signed)
Remains on simvastatin 40 mg daily.  Last labs showed LDL of 65 in April.  Follow-up with PCP.  No change.

## 2022-02-06 NOTE — Assessment & Plan Note (Signed)
Reduced dose of Toprol to 25 mg daily.  Was able to get his heart rate at 126 bpm on treadmill.  He is no longer chronotropically incompetent.

## 2022-03-01 ENCOUNTER — Encounter (INDEPENDENT_AMBULATORY_CARE_PROVIDER_SITE_OTHER): Payer: Medicare Other | Admitting: Ophthalmology

## 2022-04-04 ENCOUNTER — Encounter (HOSPITAL_COMMUNITY): Payer: Self-pay

## 2022-04-04 ENCOUNTER — Other Ambulatory Visit: Payer: Self-pay

## 2022-04-04 ENCOUNTER — Emergency Department (HOSPITAL_COMMUNITY)
Admission: EM | Admit: 2022-04-04 | Discharge: 2022-04-04 | Disposition: A | Discharge status: Home / Self Care | Payer: Medicare Other | Attending: Emergency Medicine | Admitting: Emergency Medicine

## 2022-04-04 DIAGNOSIS — Z79899 Other long term (current) drug therapy: Secondary | ICD-10-CM | POA: Diagnosis not present

## 2022-04-04 DIAGNOSIS — Z7901 Long term (current) use of anticoagulants: Secondary | ICD-10-CM | POA: Diagnosis not present

## 2022-04-04 DIAGNOSIS — I10 Essential (primary) hypertension: Secondary | ICD-10-CM | POA: Insufficient documentation

## 2022-04-04 DIAGNOSIS — R040 Epistaxis: Secondary | ICD-10-CM | POA: Diagnosis present

## 2022-04-04 LAB — BASIC METABOLIC PANEL
Anion gap: 7 (ref 5–15)
BUN: 17 mg/dL (ref 8–23)
CO2: 24 mmol/L (ref 22–32)
Calcium: 9 mg/dL (ref 8.9–10.3)
Chloride: 111 mmol/L (ref 98–111)
Creatinine, Ser: 1.34 mg/dL — ABNORMAL HIGH (ref 0.61–1.24)
GFR, Estimated: 52 mL/min — ABNORMAL LOW (ref >60)
Glucose, Bld: 109 mg/dL — ABNORMAL HIGH (ref 70–99)
Potassium: 4.1 mmol/L (ref 3.5–5.1)
Sodium: 142 mmol/L (ref 135–145)

## 2022-04-04 LAB — CBC
HCT: 47.1 % (ref 39.0–52.0)
Hemoglobin: 15.3 g/dL (ref 13.0–17.0)
MCH: 30.8 pg (ref 26.0–34.0)
MCHC: 32.5 g/dL (ref 30.0–36.0)
MCV: 94.8 fL (ref 80.0–100.0)
Platelets: 186 10*3/uL (ref 150–400)
RBC: 4.97 MIL/uL (ref 4.22–5.81)
RDW: 14.8 % (ref 11.5–15.5)
WBC: 8.5 10*3/uL (ref 4.0–10.5)
nRBC: 0 % (ref 0.0–0.2)

## 2022-04-04 LAB — PROTIME-INR
INR: 1.2 (ref 0.8–1.2)
Prothrombin Time: 14.6 seconds (ref 11.4–15.2)

## 2022-04-04 NOTE — ED Triage Notes (Signed)
Pt arrived POV stating he wants to get his nose checked out that occasionally he has a nose bleed. Pt states his last nose bleed was this morning but it only last a couple minutes. Pt states he blew his nose and it stopped.

## 2022-04-04 NOTE — ED Provider Triage Note (Signed)
Emergency Medicine Provider Triage Evaluation Note  Louis Bryan , a 86 y.o. male  was evaluated in triage.  Pt complains of epistaxis.  Started this morning.  Early bleeding from his left nose.  Lasted for 5 to 10 minutes.  Blood was dark in color.  At this time bleeding is subsided.  Patient denies lightheadedness, shortness of breath or chest pain.  He said he was recently diagnosed with "blood clot in his lungs", started on Eliquis this past summer.  Admitted to hospital for bilateral pulmonary embolism in June of this year.  Review of Systems  Positive: See above Negative: See above  Physical Exam  BP 136/79 (BP Location: Right Arm)   Pulse 75   Temp 98 F (36.7 C)   Resp 17   Ht 5\' 5"  (1.651 m)   Wt 77.1 kg   SpO2 96%   BMI 28.29 kg/m  Gen:   Awake, no distress   Resp:  Normal effort  MSK:   Moves extremities without difficulty  Other:    Medical Decision Making  Medically screening exam initiated at 9:20 AM.  Appropriate orders placed.  Louis Bryan was informed that the remainder of the evaluation will be completed by another provider, this initial triage assessment does not replace that evaluation, and the importance of remaining in the ED until their evaluation is complete.  Work up initiated   [name], Gareth Eagle 04/04/22 [phone]

## 2022-04-04 NOTE — ED Provider Notes (Signed)
[facility] EMERGENCY DEPARTMENT Provider Note   CSN: [id number] Arrival date & time: 04/04/22  0854     History  Chief Complaint  Patient presents with   Epistaxis    Baelin Petrossian is a 86 y.o. male.   Epistaxis  The patient is a 86 year old male with past medical history of HTN, HLD, PE (on Eliquis) who is presenting for evaluation of a nosebleed.  The patient states that he had a nosebleed from his left nare that began after blowing his nose earlier today.  The bleeding lasted for approximately 5 minutes and self resolved with direct pressure.  He has since been able to blow his nose without any additional episodes of bleeding.  He has no concern for airway compromise.  The patient states that this is his second nosebleed in the past week prompting him to present for evaluation.  Patient denies trauma, nausea, vomiting, chest pain, short of breath, recent fevers, or additional medical concerns.     Home Medications Prior to Admission medications   Medication Sig Start Date End Date Taking? Authorizing Provider  acetaminophen (TYLENOL) 500 MG tablet Take 500 mg by mouth every 6 (six) hours as needed for moderate pain.    [provider]  allopurinol (ZYLOPRIM) 100 MG tablet Take 1 tablet (100 mg total) by mouth daily. 07/11/17   Harrison Mons, PA  apixaban (ELIQUIS) 5 MG TABS tablet Take 5 mg by mouth 2 (two) times daily.    [provider]  Docusate Calcium (STOOL SOFTENER PO) Take 1 capsule by mouth every evening.    [provider]  HYDROcodone-acetaminophen (NORCO/VICODIN) 5-325 MG tablet Take 1 tablet by mouth every 4 (four) hours as needed. Patient not taking: Reported on 01/24/2022 11/06/21   Tacy Learn, PA-C  losartan-hydrochlorothiazide (HYZAAR) 50-12.5 MG tablet Take 1 tablet by mouth daily. 08/28/17   Harrison Mons, PA  metoprolol succinate (TOPROL-XL) 25 MG 24 hr tablet Take 1 tablet (25 mg total) by mouth daily. with food  08/28/17   Harrison Mons, PA  simvastatin (ZOCOR) 40 MG tablet Take 40 mg by mouth at bedtime. 09/20/21   [provider]      Allergies    Patient has no known allergies.    Review of Systems   Review of Systems  HENT:  Positive for nosebleeds.    See HPI Physical Exam Updated Vital Signs BP 136/79 (BP Location: Right Arm)   Pulse 75   Temp 98 F (36.7 C)   Resp 17   Ht 5\' 5"  (1.651 m)   Wt 77.1 kg   SpO2 96%   BMI 28.29 kg/m  Physical Exam Vitals and nursing note reviewed.  Constitutional:      General: He is not in acute distress.    Appearance: He is well-developed.  HENT:     Head: Normocephalic and atraumatic.     Nose: Nose normal.     Comments: No signs of bleeding, erythema, or trauma    Mouth/Throat:     Mouth: Mucous membranes are moist.     Pharynx: Oropharynx is clear.     Comments: Posterior oropharynx clear without signs of bleeding Eyes:     Conjunctiva/sclera: Conjunctivae normal.  Cardiovascular:     Rate and Rhythm: Normal rate and regular rhythm.     Heart sounds: No murmur heard. Pulmonary:     Effort: Pulmonary effort is normal. No respiratory distress.     Breath sounds: Normal breath sounds.  Abdominal:  Palpations: Abdomen is soft.     Tenderness: There is no abdominal tenderness.  Musculoskeletal:        General: No swelling.     Cervical back: Neck supple.  Skin:    General: Skin is warm and dry.     Capillary Refill: Capillary refill takes less than 2 seconds.  Neurological:     Mental Status: He is alert.  Psychiatric:        Mood and Affect: Mood normal.     ED Results / Procedures / Treatments   Labs (all labs ordered are listed, but only abnormal results are displayed) Labs Reviewed  BASIC METABOLIC PANEL - Abnormal; Notable for the following components:      Result Value   Glucose, Bld 109 (*)    Creatinine, Ser 1.34 (*)    GFR, Estimated 52 (*)    All other components within normal limits  CBC   PROTIME-INR    EKG None  Radiology No results found.  Procedures Procedures    Medications Ordered in ED Medications - No data to display  ED Course/ Medical Decision Making/ A&P                           Medical Decision Making  The patient is a 86 year old male with past medical history of HTN, HLD, PE (on Eliquis) who is presenting for evaluation of a nosebleed.  The patient states that he had a nosebleed from his left nare that began after blowing his nose earlier today.    On initial evaluation, patient was hemodynamically stable and afebrile.  The patient's physical exam was significant for no active signs of bleeding in either nare or signs of bleeding in the posterior oropharynx.  There was no concern for airway compromise.  The patient's diagnostic workup included a CBC with white blood cell count of 8.5 and hemoglobin of 15.3 which is consistent with the patient's baseline; BMP with creatinine of 1.34, also consistent with the patient's baseline; and PT/INR which was within normal limits.  Based on the patient's history of symptoms, and absence of physical exam findings, a resolved nosebleed is favored as a likely source of his symptoms.  While in the emergency department, education on the increased risk of nosebleeds while on Eliquis especially in the setting of a colder climate was discussed with the patient.  He was also provided with printed instructions on the management of nosebleeds and advised to follow-up with his PCP in 24-48 hours for reevaluation.  The patient was also given strict return precautions to the emergency department.  Amount and/or Complexity of Data Reviewed External Data Reviewed: notes. Labs: ordered. Decision-making details documented in ED Course.   Patient's presentation is most consistent with acute illness / injury with system symptoms.         Final Clinical Impression(s) / ED Diagnoses Final diagnoses:  Epistaxis    Rx / DC  Orders ED Discharge Orders     None         Knox Saliva, MD 04/04/22 [id number]    Pricilla Loveless, MD 04/08/22 [phone]

## 2022-11-28 ENCOUNTER — Emergency Department (HOSPITAL_COMMUNITY)
Admission: EM | Admit: 2022-11-28 | Discharge: 2022-11-28 | Disposition: A | Discharge status: Home / Self Care | Payer: Medicare Other | Attending: Emergency Medicine | Admitting: Emergency Medicine

## 2022-11-28 ENCOUNTER — Encounter (HOSPITAL_COMMUNITY): Payer: Self-pay

## 2022-11-28 ENCOUNTER — Other Ambulatory Visit: Payer: Self-pay

## 2022-11-28 DIAGNOSIS — Z86711 Personal history of pulmonary embolism: Secondary | ICD-10-CM | POA: Insufficient documentation

## 2022-11-28 DIAGNOSIS — I10 Essential (primary) hypertension: Secondary | ICD-10-CM | POA: Insufficient documentation

## 2022-11-28 DIAGNOSIS — R040 Epistaxis: Secondary | ICD-10-CM | POA: Insufficient documentation

## 2022-11-28 DIAGNOSIS — Z7901 Long term (current) use of anticoagulants: Secondary | ICD-10-CM | POA: Diagnosis not present

## 2022-11-28 DIAGNOSIS — Z79899 Other long term (current) drug therapy: Secondary | ICD-10-CM | POA: Insufficient documentation

## 2022-11-28 NOTE — ED Provider Notes (Signed)
[place] EMERGENCY DEPARTMENT AT [facility] Provider Note   CSN: [id number] Arrival date & time: 11/28/22  4098     History  Chief Complaint  Patient presents with   Epistaxis    No active bleeding    Louis Bryan is a 87 y.o. male.  Patient is an 87 year old male with a history of hypertension, PE on Eliquis who is presenting today due to intermittent nosebleeds.  He reports it is always on the left side and it has been bothering him for the last few weeks.  He typically reports that will bleed about once a day but this morning it blood twice.  It stops quickly when he pinches his nose and is not persistent but because it was twice today it concerned him.  He denies recent congestion or severe allergies.  He does not use any nasal sprays.  He has never had any intervention on his nose in the past.  The history is provided by the patient.       Home Medications Prior to Admission medications   Medication Sig Start Date End Date Taking? Authorizing Provider  acetaminophen (TYLENOL) 500 MG tablet Take 500 mg by mouth every 6 (six) hours as needed for moderate pain.    [provider]  allopurinol (ZYLOPRIM) 100 MG tablet Take 1 tablet (100 mg total) by mouth daily. 07/11/17   Porfirio Oar, PA  apixaban (ELIQUIS) 5 MG TABS tablet Take 5 mg by mouth 2 (two) times daily.    [provider]  Docusate Calcium (STOOL SOFTENER PO) Take 1 capsule by mouth every evening.    [provider]  HYDROcodone-acetaminophen (NORCO/VICODIN) 5-325 MG tablet Take 1 tablet by mouth every 4 (four) hours as needed. Patient not taking: Reported on 01/24/2022 11/06/21   Jeannie Fend, PA-C  losartan-hydrochlorothiazide (HYZAAR) 50-12.5 MG tablet Take 1 tablet by mouth daily. 08/28/17   Porfirio Oar, PA  metoprolol succinate (TOPROL-XL) 25 MG 24 hr tablet Take 1 tablet (25 mg total) by mouth daily. with food 08/28/17   Porfirio Oar, PA  simvastatin (ZOCOR) 40 MG  tablet Take 40 mg by mouth at bedtime. 09/20/21   [provider]      Allergies    Patient has no known allergies.    Review of Systems   Review of Systems  Physical Exam Updated Vital Signs BP (!) 134/91   Pulse 95   Temp 98.3 F (36.8 C)   Resp 16   SpO2 100%  Physical Exam Vitals and nursing note reviewed.  HENT:     Head: Normocephalic.     Nose:     Comments: Patient's left septum with a small vessel present with a small ulcer which appears like it had recently been bleeding.  No epistaxis at this time Cardiovascular:     Rate and Rhythm: Normal rate.  Pulmonary:     Effort: Pulmonary effort is normal.  Neurological:     Mental Status: He is alert.  Psychiatric:        Mood and Affect: Mood normal.     ED Results / Procedures / Treatments   Labs (all labs ordered are listed, but only abnormal results are displayed) Labs Reviewed - No data to display  EKG None  Radiology No results found.  Procedures .Epistaxis Management  Date/Time: 11/28/2022 8:11 AM  Performed by: Gwyneth Sprout, MD Authorized by: Gwyneth Sprout, MD   Consent:    Consent obtained:  Verbal   Consent given  by:  Patient   Risks discussed:  Bleeding Universal protocol:    Patient identity confirmed:  Verbally with patient Anesthesia:    Anesthesia method:  None Procedure details:    Treatment site:  L septum   Treatment method:  Silver nitrate   Treatment complexity:  Limited   Treatment episode: initial   Post-procedure details:    Post-procedure assessment: no bleeding.   Procedure completion:  Tolerated     Medications Ordered in ED Medications - No data to display  ED Course/ Medical Decision Making/ A&P                             Medical Decision Making  Patient presenting today with intermittent nosebleeds.  He has no epistaxis at this time.  On exam patient does appear to have a superficial vessel that looks like it is most likely the cause of  his bleed.  No findings to suggest posterior bleed.  Area was cauterized without any difficulty.  Patient was discharged home in good condition.        Final Clinical Impression(s) / ED Diagnoses Final diagnoses:  Epistaxis    Rx / DC Orders ED Discharge Orders     None         Gwyneth Sprout, MD 11/28/22 [phone]

## 2022-11-28 NOTE — ED Triage Notes (Signed)
Pt c/o intermittent nosebleeds x 2 months out of L nare; no bleeding currently; pt on Eliquis, Hx PE

## 2022-11-28 NOTE — Discharge Instructions (Addendum)
Try to minimize the amount you are blowing your nose in the next week.  Take a Q-tip with some Vaseline and place it on the inside of your left nostril along the middle part of your nose to keep the area from getting too dry.

## 2022-12-12 ENCOUNTER — Other Ambulatory Visit: Payer: Self-pay

## 2022-12-12 ENCOUNTER — Encounter (HOSPITAL_COMMUNITY): Payer: Self-pay

## 2022-12-12 ENCOUNTER — Emergency Department (HOSPITAL_COMMUNITY)
Admission: EM | Admit: 2022-12-12 | Discharge: 2022-12-12 | Disposition: A | Discharge status: Home / Self Care | Payer: Medicare Other | Attending: Student | Admitting: Student

## 2022-12-12 DIAGNOSIS — I10 Essential (primary) hypertension: Secondary | ICD-10-CM | POA: Insufficient documentation

## 2022-12-12 DIAGNOSIS — Z7901 Long term (current) use of anticoagulants: Secondary | ICD-10-CM | POA: Diagnosis not present

## 2022-12-12 DIAGNOSIS — R040 Epistaxis: Secondary | ICD-10-CM | POA: Diagnosis present

## 2022-12-12 DIAGNOSIS — E119 Type 2 diabetes mellitus without complications: Secondary | ICD-10-CM | POA: Insufficient documentation

## 2022-12-12 DIAGNOSIS — Z79899 Other long term (current) drug therapy: Secondary | ICD-10-CM | POA: Diagnosis not present

## 2022-12-12 DIAGNOSIS — Z87891 Personal history of nicotine dependence: Secondary | ICD-10-CM | POA: Diagnosis not present

## 2022-12-12 MED ORDER — SALINE SPRAY 0.65 % NA SOLN: 1.0000 | Freq: Every day | NASAL | 0 refills | Status: DC

## 2022-12-12 NOTE — ED Triage Notes (Addendum)
Pt c/o int nose bleedx61mo. Pt states last time nose bled was this morning. Pt's nose is not currently bleeding. Pt denies dizziness or weakness. Pt states he takes eliquis

## 2022-12-12 NOTE — ED Provider Notes (Signed)
EMERGENCY DEPARTMENT AT [facility] Provider Note  CSN: [id number] Arrival date & time: 12/12/22 4010  Chief Complaint(s) Epistaxis  HPI Louis Bryan is a 87 y.o. male with PMH previous bilateral PEs on Eliquis who presents emergency room for evaluation of epistaxis.  Patient was seen on 04/04/2022 and 11/28/2022 for the same complaint.  He states that when he wakes up in the mornings, he will have a small trickle of blood coming from the left nare.  He will blow his nose leading to more nosebleeding but it ultimately stopped after compression.  Here in the emergency room, patient does not have any active epistaxis.  He denies any trauma to the nose, chest pain, shortness of breath, abdominal pain, nausea, vomiting or any other systemic symptoms.   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 Authorizing Provider  sodium chloride (OCEAN) 0.65 % SOLN nasal spray Place 1 spray into both nostrils at bedtime. 12/12/22 10/08/23 Yes Tamella Tuccillo, MD  acetaminophen (TYLENOL) 500 MG tablet Take 500 mg by mouth every 6 (six) hours as needed for moderate pain.    [provider]  allopurinol (ZYLOPRIM) 100 MG tablet Take 1 tablet (100 mg total) by mouth daily. 07/11/17   Porfirio Oar, PA  apixaban (ELIQUIS) 5 MG TABS tablet Take 5 mg by mouth 2 (two) times daily.    [provider]  Docusate Calcium (STOOL SOFTENER PO) Take 1 capsule by mouth every evening.    [provider]  HYDROcodone-acetaminophen (NORCO/VICODIN) 5-325 MG tablet Take 1 tablet by mouth every 4 (four) hours as needed. Patient not taking: Reported on 01/24/2022 11/06/21   Jeannie Fend, PA-C  losartan-hydrochlorothiazide (HYZAAR) 50-12.5 MG tablet Take 1 tablet by mouth daily. 08/28/17   Porfirio Oar, PA  metoprolol succinate (TOPROL-XL) 25 MG 24 hr tablet Take 1 tablet (25 mg total) by mouth daily. with food 08/28/17   Porfirio Oar, PA  simvastatin (ZOCOR) 40 MG tablet Take 40 mg by mouth at bedtime. 09/20/21   [provider]  Past Surgical History Past Surgical History:  Procedure Laterality Date   CATARACT EXTRACTION Left 07/2016   CHOLECYSTECTOMY N/A 10/27/2012   Procedure: LAPAROSCOPIC CHOLECYSTECTOMY WITH INTRAOPERATIVE CHOLANGIOGRAM;  Surgeon: Romie Levee, MD;  Location: [facility] ORS;  Service: General;  Laterality: N/A;   NM MYOVIEW LTD  12/29/2021   The study is normal. The study is low risk.   No ST deviation  was noted => rate related RBBB, peak HR 126 bpm-no chronotropic incompetence.    Left ventricular function is normal. Nuclear stress EF: 61 %. The left ventricular ejection fraction is normal (55-65%). End diastolic cavity size is normal.   Prior study available for comparison from 12/11/2003. No changes compared to prior study.   TRANSTHORACIC ECHOCARDIOGRAM  10/22/2021   Normal LV size and function.  EF 60 to 65%.  GR 1 DD.  Normal RV size and function.  Normal RSVP.  Normal RAP.  Normal MV with mild AOV sclerosis with no stenosis.   Family History Family History  Problem Relation Age of Onset   Diabetes Sister    Diabetes Sister     Social History Social History   Tobacco Use   Smoking status: Former    Current packs/day: 0.00    Types: Cigarettes    Quit date: 06/13/1992    Years since quitting: 30.5   Smokeless tobacco: Never  Vaping Use   Vaping status: Never Used  Substance Use Topics   Alcohol use: No   Drug use: No   Allergies Patient has no known allergies.  Review of Systems Review of Systems  HENT:  Positive for nosebleeds.     Physical Exam Vital Signs  I have reviewed the triage vital signs BP 128/82   Pulse 79   Temp 98.3 F (36.8 C) (Oral)   Resp 16   Ht 5\' 5"  (1.651 m)   Wt 77.1 kg   SpO2 100%   BMI 28.29 kg/m   Physical Exam Constitutional:      General: He is not in acute distress.    Appearance: Normal appearance.  HENT:     Head: Normocephalic and atraumatic.     Nose: No congestion or rhinorrhea.  Eyes:     General:        Right eye: No discharge.        Left eye: No discharge.     Extraocular Movements: Extraocular movements intact.     Pupils: Pupils are equal, round, and reactive to light.  Cardiovascular:     Rate and Rhythm: Normal rate and regular rhythm.     Heart sounds: No murmur heard. Pulmonary:     Effort: No respiratory distress.     Breath sounds: No wheezing or rales.  Abdominal:     General: There is no distension.      Tenderness: There is no abdominal tenderness.  Musculoskeletal:        General: Normal range of motion.     Cervical back: Normal range of motion.  Skin:    General: Skin is warm and dry.  Neurological:     General: No focal deficit present.     Mental Status: He is alert.     ED Results and Treatments Labs (all labs ordered are listed, but only abnormal results are displayed) Labs Reviewed - No data to display  Radiology No results found.  Pertinent labs & imaging results that were available during my care of the patient were reviewed by me and considered in my medical decision making (see MDM for details).  Medications Ordered in ED Medications - No data to display                                                                                                                                   Procedures Procedures  (including critical care time)  Medical Decision Making / ED Course   This patient presents to the ED for concern of epistaxis, this involves an extensive number of treatment options, and is a complaint that carries with it a high risk of complications and morbidity.  The differential diagnosis includes coagulopathy, Eliquis use, digital trauma, anterior nosebleed, posterior nosebleed  MDM: Patient seen emergency room for evaluation of epistaxis.  Physical exam is unremarkable with no evidence of active epistaxis or significant canal erythema.  We had a long discussion about his nosebleed symptoms, and I suspect that his nose is likely drying out overnight leading to increased friability and with Eliquis on board leads to a small nosebleed.  We will trial nightly Ocean Spray and he was given return precautions regarding his nosebleeds.  He and his daughter voiced understanding of these but at this time he does not meet inpatient criteria for  admission and he is safe for discharge with outpatient follow-up.  Outpatient ENT follow-up referral placed.   Additional history obtained: -Additional history obtained from daughter -External records from outside source obtained and reviewed including: Chart review including previous notes, labs, imaging, consultation notes   Medicines ordered and prescription drug management: Meds ordered this encounter  Medications   sodium chloride (OCEAN) 0.65 % SOLN nasal spray    Sig: Place 1 spray into both nostrils at bedtime.    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 Positive for acute pulmonary embolus with CT evidence of right   Shortness of breath    On exertion   Tobacco abuse       Dispostion: I considered admission for this patient, but at  this time he does not meet inpatient criteria for admission and he is safe for discharge with outpatient follow-up     Final Clinical Impression(s) / ED Diagnoses Final diagnoses:  Epistaxis     @PCDICTATION @    Glendora Score, MD 12/12/22 [phone]

## 2022-12-31 ENCOUNTER — Emergency Department (HOSPITAL_COMMUNITY)
Admission: EM | Admit: 2022-12-31 | Discharge: 2022-12-31 | Disposition: A | Discharge status: Home / Self Care | Payer: Medicare Other | Attending: Emergency Medicine | Admitting: Emergency Medicine

## 2022-12-31 ENCOUNTER — Encounter (HOSPITAL_COMMUNITY): Payer: Self-pay

## 2022-12-31 ENCOUNTER — Other Ambulatory Visit: Payer: Self-pay

## 2022-12-31 DIAGNOSIS — I10 Essential (primary) hypertension: Secondary | ICD-10-CM | POA: Diagnosis not present

## 2022-12-31 DIAGNOSIS — Z79899 Other long term (current) drug therapy: Secondary | ICD-10-CM | POA: Diagnosis not present

## 2022-12-31 DIAGNOSIS — Z7901 Long term (current) use of anticoagulants: Secondary | ICD-10-CM | POA: Insufficient documentation

## 2022-12-31 DIAGNOSIS — R040 Epistaxis: Secondary | ICD-10-CM

## 2022-12-31 NOTE — ED Notes (Addendum)
Pt verbalized understanding of discharge instructions. Opportunity for questions provided.  Pt aware of follow-up appointment.

## 2022-12-31 NOTE — ED Provider Notes (Signed)
[place] EMERGENCY DEPARTMENT AT [facility] Provider Note   CSN: [id number] Arrival date & time: 12/31/22  1191     History  Chief Complaint  Patient presents with   Epistaxis    Louis Bryan is a 87 y.o. male.  Patient is an 87 year old male with past medical history of hypertension and PE on Eliquis presenting to the emergency department with nosebleed.  The patient states that he was seen here a few weeks ago for his nosebleed and was started on a saline spray which she has been using every night.  He states that despite using this nearly every morning he has a nosebleed when he blows his nose.  He states that he has some blood in the tissue but never has any dripping blood and that the bleeding always stops on its own.  He denies any lightheadedness or dizziness, chest pain or shortness of breath.  He states that he has been taking Eliquis as prescribed.  The history is provided by the patient.  Epistaxis      Home Medications Prior to Admission medications   Medication Sig Start Date End Date Taking? Authorizing Provider  acetaminophen (TYLENOL) 500 MG tablet Take 500 mg by mouth every 6 (six) hours as needed for moderate pain.    [provider]  allopurinol (ZYLOPRIM) 100 MG tablet Take 1 tablet (100 mg total) by mouth daily. 07/11/17   Porfirio Oar, PA  apixaban (ELIQUIS) 5 MG TABS tablet Take 5 mg by mouth 2 (two) times daily.    [provider]  Docusate Calcium (STOOL SOFTENER PO) Take 1 capsule by mouth every evening.    [provider]  HYDROcodone-acetaminophen (NORCO/VICODIN) 5-325 MG tablet Take 1 tablet by mouth every 4 (four) hours as needed. Patient not taking: Reported on 01/24/2022 11/06/21   Jeannie Fend, PA-C  losartan-hydrochlorothiazide (HYZAAR) 50-12.5 MG tablet Take 1 tablet by mouth daily. 08/28/17   Porfirio Oar, PA  metoprolol succinate (TOPROL-XL) 25 MG 24 hr tablet Take 1 tablet (25 mg total) by mouth daily.  with food 08/28/17   Porfirio Oar, PA  simvastatin (ZOCOR) 40 MG tablet Take 40 mg by mouth at bedtime. 09/20/21   [provider]  sodium chloride (OCEAN) 0.65 % SOLN nasal spray Place 1 spray into both nostrils at bedtime. 12/12/22 10/08/23  Kommor, Wyn Forster, MD      Allergies    Patient has no known allergies.    Review of Systems   Review of Systems  HENT:  Positive for nosebleeds.     Physical Exam Updated Vital Signs BP 124/82   Pulse 70   Temp 98 F (36.7 C) (Oral)   Resp 18   Ht 5\' 5"  (1.651 m)   Wt 79.4 kg   SpO2 99%   BMI 29.12 kg/m  Physical Exam Vitals and nursing note reviewed.  Constitutional:      General: He is not in acute distress.    Appearance: Normal appearance.  HENT:     Head: Normocephalic and atraumatic.     Nose:     Comments: Small scab on lateral aspect of left nare, no active bleeding    Mouth/Throat:     Mouth: Mucous membranes are moist.     Pharynx: Oropharynx is clear.     Comments: No blood in the posterior oropharynx Eyes:     Extraocular Movements: Extraocular movements intact.     Conjunctiva/sclera: Conjunctivae normal.  Cardiovascular:     Rate and  Rhythm: Normal rate and regular rhythm.  Pulmonary:     Effort: Pulmonary effort is normal.  Abdominal:     General: Abdomen is flat.  Musculoskeletal:        General: Normal range of motion.     Cervical back: Normal range of motion.  Skin:    General: Skin is warm and dry.  Neurological:     General: No focal deficit present.     Mental Status: He is alert and oriented to person, place, and time.  Psychiatric:        Mood and Affect: Mood normal.        Behavior: Behavior normal.     ED Results / Procedures / Treatments   Labs (all labs ordered are listed, but only abnormal results are displayed) Labs Reviewed - No data to display  EKG None  Radiology No results found.  Procedures Procedures    Medications Ordered in ED Medications - No data to  display  ED Course/ Medical Decision Making/ A&P                                 Medical Decision Making This patient presents to the ED with chief complaint(s) of nosebleed with pertinent past medical history of hypertension, PE on Eliquis which further complicates the presenting complaint. The complaint involves an extensive differential diagnosis and also carries with it a high risk of complications and morbidity.    The differential diagnosis includes anterior nosebleed, no evidence of blood in the posterior oropharynx and no active bleeding making a posterior nosebleed unlikely, patient has no dizziness and no prolonged bleeding making anemia unlikely  Additional history obtained: Additional history obtained from N/A Records reviewed recent ED records  ED Course and Reassessment: On patient's arrival he is hemodynamically stable in no acute distress without active bleeding.  He has been seen twice in the last month for his nosebleeds and last 2 times has not required any intervention.  He is reporting very mild bleeding with blowing his nose in the mornings and he is recommended to continue his Eliquis and can increase his saline nasal spray to twice daily.  He does have ENT follow-up in a few weeks.  He was monitored for over an hour without any evidence of bleeding and is stable for discharge home.  The patient has no signs of anemia and does not require labs today and was given strict return precautions.  Independent labs interpretation:  N/A  Independent visualization of imaging: -N/A  Consultation: - Consulted or discussed management/test interpretation w/ external professional: N/A  Consideration for admission or further workup: Patient has no emergent conditions requiring admission or further work-up at this time and is stable for discharge home with ENT follow-up  Social Determinants of health: N/A            Final Clinical Impression(s) / ED Diagnoses Final  diagnoses:  Left-sided epistaxis    Rx / DC Orders ED Discharge Orders     None         Rexford Maus, DO 12/31/22 1610

## 2022-12-31 NOTE — Discharge Instructions (Signed)
You were seen in the emergency department for your nosebleed.  You had no evidence of bleeding while you are here and no signs of significant blood loss.  You should continue to take your Eliquis as prescribed and can use your saline nasal spray twice daily to help keep your nose moist to help prevent bleeding.  You should follow-up with ENT as planned.  If you have a nosebleed at home you should hold pressure along the soft part of your nose as hard as you can for at least 10 to 15 minutes.  If you have prolonged bleeding lasting more than 15 to 20 minutes you should return to the emergency department for reevaluation or if you have any other new or concerning symptoms.

## 2022-12-31 NOTE — ED Triage Notes (Signed)
Pt c.o nosebleed in left nostril around 6am this morning, pt seen here for the same on 8/5. Pt states he has follow up appointment with ENT on 9/5. No bleeding at this time, pt denies any headache or dizziness. Takes Eliquis twice a day

## 2022-12-31 NOTE — ED Notes (Addendum)
Bleeding is currently controlled.

## 2023-01-12 ENCOUNTER — Ambulatory Visit (INDEPENDENT_AMBULATORY_CARE_PROVIDER_SITE_OTHER): Payer: Medicare Other | Admitting: Otolaryngology

## 2023-01-12 ENCOUNTER — Encounter (INDEPENDENT_AMBULATORY_CARE_PROVIDER_SITE_OTHER): Payer: Self-pay | Admitting: Otolaryngology

## 2023-01-12 VITALS — BP 123/69 | HR 83 | Ht 66.0 in | Wt 180.9 lb

## 2023-01-12 DIAGNOSIS — R0981 Nasal congestion: Secondary | ICD-10-CM

## 2023-01-12 DIAGNOSIS — J343 Hypertrophy of nasal turbinates: Secondary | ICD-10-CM

## 2023-01-12 DIAGNOSIS — Z7901 Long term (current) use of anticoagulants: Secondary | ICD-10-CM | POA: Diagnosis not present

## 2023-01-12 DIAGNOSIS — R040 Epistaxis: Secondary | ICD-10-CM | POA: Diagnosis not present

## 2023-01-12 DIAGNOSIS — J342 Deviated nasal septum: Secondary | ICD-10-CM

## 2023-01-12 MED ORDER — SALINE SPRAY 0.65 % NA SOLN: 1.0000 | NASAL | 5 refills | Status: AC | PRN

## 2023-01-12 MED ORDER — OXYMETAZOLINE HCL 0.05 % NA SOLN: 1.0000 | Freq: Two times a day (BID) | NASAL | 0 refills | Status: AC

## 2023-01-12 NOTE — Progress Notes (Signed)
ENT CONSULT:  Reason for Consult: left sided epistaxis   HPI: Louis Bryan is an 87 y.o. male with hx PE, on Eliquis, former smoker, here for recurrent low-volume epistaxis left side of the nose, typically in am. He has been seen in ED since July a few times for the same issue, once had cautery done with Silver Nitrate on the left side, but continued to have epistaxis. He typically has a nose bleed while blowing his nose, does not do anything for it right now. Most of the time it self resolves. No hx of nasal packing. No nasal surgery. Last epistaxis was this am. Denies issues with BP control.   Records Reviewed:  12/31/22 ED visit  atient is an 87 year old male with past medical history of hypertension and PE on Eliquis presenting to the emergency department with nosebleed. The patient states that he was seen here a few weeks ago for his nosebleed and was started on a saline spray which she has been using every night. He states that despite using this nearly every morning he has a nosebleed when he blows his nose. He states that he has some blood in the tissue but never has any dripping blood and that the bleeding always stops on its own. He denies any lightheadedness or dizziness, chest pain or shortness of breath. He states that he has been taking Eliquis as prescribed.    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 Positive for acute pulmonary embolus with CT evidence of right   Shortness of breath    On exertion   Tobacco abuse     Past Surgical History:  Procedure Laterality Date   CATARACT  EXTRACTION Left 07/2016   CHOLECYSTECTOMY N/A 10/27/2012   Procedure: LAPAROSCOPIC CHOLECYSTECTOMY WITH INTRAOPERATIVE CHOLANGIOGRAM;  Surgeon: Romie Levee, MD;  Location: [facility]S;  Service: General;  Laterality: N/A;   NM MYOVIEW LTD  12/29/2021   The study is normal. The study is low risk.   No ST deviation was noted => rate related RBBB, peak HR 126 bpm-no chronotropic incompetence.    Left ventricular function is normal. Nuclear stress EF: 61 %. The left ventricular ejection fraction is normal (55-65%). End diastolic cavity size is normal.   Prior study available for comparison from 12/11/2003. No changes compared to prior study.   TRANSTHORACIC ECHOCARDIOGRAM  10/22/2021   Normal LV size and function.  EF 60 to 65%.  GR 1 DD.  Normal RV size and function.  Normal RSVP.  Normal RAP.  Normal MV with mild AOV sclerosis with no stenosis.    Family History  Problem Relation Age of Onset   Diabetes Sister    Diabetes Sister     Social History:  reports that he quit smoking about 30 years ago. His smoking use included cigarettes. He has never used smokeless tobacco. He reports that he does not drink alcohol and does not use drugs.  Allergies: No Known Allergies  Medications: I have reviewed the patient's current medications.  The PMH, PSH, Medications, Allergies, and SH were reviewed and updated.  ROS: Constitutional: Negative for fever, weight loss and weight  gain. Cardiovascular: Negative for chest pain and dyspnea on exertion. Respiratory: Is not experiencing shortness of breath at rest. Gastrointestinal: Negative for nausea and vomiting. Neurological: Negative for headaches. Psychiatric: The patient is not nervous/anxious  Blood pressure 123/69, pulse 83, height 5\' 6"  (1.676 m), weight 180 lb 14.4 oz (82.1 kg), SpO2 96%.  PHYSICAL EXAM:  Exam: General: Well-developed, well-nourished Communication and Voice: slightly raspy  Respiratory Respiratory effort: Equal inspiration and  expiration without stridor Cardiovascular Peripheral Vascular: Warm extremities with equal color/perfusion Eyes: No nystagmus with equal extraocular motion bilaterally Neuro/Psych/Balance: Patient oriented to person, place, and time; Ap propriate mood and affect; Gait is intact with no imbalance; Cranial nerves I-XII are intact Head and Face Inspection: Normocephalic and atraumatic without mass or lesion Palpation: Facial skeleton intact without bony stepoffs Salivary Glands: No mass or tenderness Facial Strength: Facial motility symmetric and full bilaterally ENT Pinna: External ear intact and fully developed External canal: Canal is patent with intact skin Tympanic Membrane: Clear and mobile External Nose: No scar or anatomic deformity Internal Nose: Septum is slightly deviated to the left with caudal septal spur.  Prominent blood vessels at the anterior septum bilaterally no excoriations or ulcerations of the nasal mucosa, no polyp, or purulence.  Area of dried blood along the inferior turbinate on the left, anterior aspect.  No active epistaxis mucosal edema and erythema present.  Bilateral inferior turbinate hypertrophy.  Lips, Teeth, and gums: Mucosa and teeth intact and viable TMJ: No pain to palpation with full mobility Oral cavity/oropharynx: No erythema or exudate, no lesions present Nasopharynx: No mass or lesion with intact mucosa Hypopharynx: Intact mucosa without pooling of secretions Larynx Glottic: Full true vocal cord mobility without lesion or mass Supraglottic: Normal appearing epiglottis and AE folds Interarytenoid Space: No or minimal pachydermia or edema Subglottic Space: Patent without lesion or edema Neck Neck and Trachea: Midline trachea without mass or lesion Thyroid: No mass or nodularity Lymphatics: No lymphadenopathy  Procedure:   PROCEDURE NOTE: nasal endoscopy  Preoperative diagnosis: recurrent epistaxis   Postoperative diagnosis:  same  Procedure: Diagnostic nasal endoscopy (10272)  Surgeon: Ashok Croon, M.D.  Anesthesia: Topical lidocaine and Afrin  H&P REVIEW: The patient's history and physical were reviewed today prior to procedure. All medications were reviewed and updated as well. Complications: None Condition is stable throughout exam Indications and consent: The patient presents with symptoms of chronic sinusitis not responding to previous therapies. All the risks, benefits, and potential complications were reviewed with the patient preoperatively and informed consent was obtained. The time out was completed with confirmation of the correct procedure.   Procedure: The patient was seated upright in the clinic. Topical lidocaine and Afrin were applied to the nasal cavity. After adequate anesthesia had occurred, the rigid nasal endoscope was passed into the nasal cavity. The nasal mucosa, turbinates, septum, and sinus drainage pathways were visualized bilaterally. This revealed no purulence or significant secretions that might be cultured. There were no polyps or sites of significant inflammation. The mucosa was intact and there was no crusting present. The scope was then slowly withdrawn and the patient tolerated the procedure well. There were no complications or blood loss.   Studies Reviewed: CT Angio PA 10/22/22 CLINICAL DATA:  Pulmonary embolism (PE) suspected, high prob   Left-sided chest pain.   EXAM: CT ANGIOGRAPHY CHEST WITH CONTRAST   TECHNIQUE: Multidetector CT imaging of the chest was performed using the standard protocol during bolus administration of intravenous contrast. Multiplanar CT image reconstructions and MIPs  were obtained to evaluate the vascular anatomy.   RADIATION DOSE REDUCTION: This exam was performed according to the departmental dose-optimization program which includes automated exposure control, adjustment of the mA and/or kV according to patient size and/or use of  iterative reconstruction technique.   CONTRAST:  70mL OMNIPAQUE IOHEXOL 350 MG/ML SOLN   COMPARISON:  Radiograph earlier today no prior chest CT.   FINDINGS: Cardiovascular: Examination is positive for bilateral pulmonary emboli. There are filling defects within the segmental branches to both upper lobes. There is lobar involvement of the right middle lobe. Probable subsegmental involvement in the right lower lobe, although obscured by motion on the current exam. There may be an element of chronic clot as some of the filling defects are a centric and peripheral. Overall clot burden is moderate. Positive for right heart strain with RV to LV ratio of 1.22. the heart is normal in size. Aortic tortuosity with minimal atherosclerosis. No pericardial effusion.   Mediastinum/Nodes: No mediastinal adenopathy. No enlarged hilar lymph nodes. Small hiatal hernia. No visualized thyroid nodule.   Lungs/Pleura: Moderate emphysema. Mild central bronchial thickening. Perifissural atelectasis in the left upper lobe. Subsegmental atelectasis in the right greater than left lower lobes. No confluent consolidation. No pleural effusion. No pulmonary mass. The trachea and central bronchi are patent.   Upper Abdomen: Simple cysts in both kidneys are partially included, needing no further follow-up. Cholecystectomy. Left colonic diverticulosis. No acute upper abdominal findings.   Musculoskeletal: Prominent Schmorl's node with slight loss of height involving T11 superior endplate, appearing chronic. There are no acute or suspicious osseous abnormalities.   Review of the MIP images confirms the above findings.   IMPRESSION: 1. Positive for acute pulmonary embolus with CT evidence of right heart strain (RV/LV Ratio = 1.22) consistent with at least submassive (intermediate risk) PE. The presence of right heart strain has been associated with an increased risk of morbidity and mortality. Please refer to  the "Code PE Focused" order set in EPIC. 2. Emphysema with mild bronchial thickening. Scattered atelectasis in the left upper and both lower lobes.   Aortic Atherosclerosis (ICD10-I70.0) and Emphysema (ICD10-J43.9).  Assessment/Plan: Encounter Diagnoses  Name Primary?   Epistaxis Yes   Deviated nasal septum    Anticoagulated    Nasal congestion    Hypertrophy of inferior nasal turbinate    87 year old male with history of PE currently on Eliquis, here for recurrent epistaxis, which appears to be low-volume and self resolving, anterior epistaxis from the left side, typically wakes up blows his nose and notices some blood on the tissue, but at times has actual dripping of blood at the tip of his nose on the left side.  He was seen in the ED few times for nosebleed, in July 2024 had cautery performed with silver nitrate, no history of nasal packing in the past.  On my exam today including bilateral nasal endoscopy there was evidence of prominent blood vessels along the anterior septum bilaterally and a small area of dried blood along the anterior portion of left inferior turbinate, but there was no blood clot or active epistaxis.  There was also evidence of nasal congestion.  I discussed risk factors for recurrent epistaxis.  I reassured the patient that there was no evidence of a mass or posterior sources of his bleeding.  Based on description epistaxis prevention will likely decrease frequency of his epistaxis episodes.  I counseled the patient on epistaxis prevention.  Due to the fact that there was no clear source  to cauterize today we will defer any cautery for any future episodes of epistaxis.  All questions have been answered.   Epistaxis prevention instructions given to the patient: - use nasal saline spray x6/day and Vaseline twice a day to prevent nose bleeds - for active nose bleeds use Afrin and nasal tip pressure x 10 min to stop them - if nose bleed does not stop with above measures,  please go to Emergency Room  - please see your primary care provider to check your blood pressure and make sure it is under control - return for recurrent nose bleeds and we will consider cautery of your nasal blood vessels  - Purchase BleedStop to have at home and help with epistaxis control   Thank you for allowing me to participate in the care of this patient. Please do not hesitate to contact me with any questions or concerns.   Ashok Croon, MD Otolaryngology [facility] Phone: [phone] Fax: [phone]    01/12/2023, 8:54 PM

## 2023-01-12 NOTE — Patient Instructions (Signed)
 Epistaxis prevention instructions given to the patient: - use nasal saline spray x6/day and Vaseline twice a day to prevent nose bleeds - for active nose bleeds use Afrin and nasal tip pressure x 10 min to stop them - if nose bleed does not stop with above measures, please go to Emergency Room  - please see your primary care provider to check your blood pressure and make sure it is under control - return for recurrent nose bleeds and we will consider cautery of your nasal blood vessels  - Purchase BleedStop to have at home and help with epistaxis control

## 2023-01-23 ENCOUNTER — Encounter (INDEPENDENT_AMBULATORY_CARE_PROVIDER_SITE_OTHER): Payer: Medicare Other | Admitting: Ophthalmology

## 2023-02-03 DIAGNOSIS — N1831 Chronic kidney disease, stage 3a: Secondary | ICD-10-CM | POA: Insufficient documentation

## 2023-02-05 ENCOUNTER — Other Ambulatory Visit: Payer: Self-pay

## 2023-02-05 ENCOUNTER — Emergency Department (HOSPITAL_BASED_OUTPATIENT_CLINIC_OR_DEPARTMENT_OTHER): Payer: Medicare Other

## 2023-02-05 ENCOUNTER — Emergency Department (HOSPITAL_COMMUNITY): Payer: Medicare Other

## 2023-02-05 ENCOUNTER — Telehealth: Payer: Self-pay

## 2023-02-05 ENCOUNTER — Emergency Department (HOSPITAL_COMMUNITY)
Admission: EM | Admit: 2023-02-05 | Discharge: 2023-02-05 | Disposition: A | Discharge status: Home / Self Care | Payer: Medicare Other | Attending: Emergency Medicine | Admitting: Emergency Medicine

## 2023-02-05 ENCOUNTER — Encounter (HOSPITAL_COMMUNITY): Payer: Self-pay | Admitting: Emergency Medicine

## 2023-02-05 DIAGNOSIS — M79661 Pain in right lower leg: Secondary | ICD-10-CM | POA: Diagnosis not present

## 2023-02-05 DIAGNOSIS — I82451 Acute embolism and thrombosis of right peroneal vein: Secondary | ICD-10-CM | POA: Diagnosis not present

## 2023-02-05 DIAGNOSIS — I700 Atherosclerosis of aorta: Secondary | ICD-10-CM | POA: Insufficient documentation

## 2023-02-05 DIAGNOSIS — I10 Essential (primary) hypertension: Secondary | ICD-10-CM | POA: Diagnosis not present

## 2023-02-05 DIAGNOSIS — M545 Low back pain, unspecified: Secondary | ICD-10-CM | POA: Diagnosis present

## 2023-02-05 DIAGNOSIS — K5730 Diverticulosis of large intestine without perforation or abscess without bleeding: Secondary | ICD-10-CM | POA: Insufficient documentation

## 2023-02-05 DIAGNOSIS — Z7901 Long term (current) use of anticoagulants: Secondary | ICD-10-CM | POA: Insufficient documentation

## 2023-02-05 DIAGNOSIS — E119 Type 2 diabetes mellitus without complications: Secondary | ICD-10-CM | POA: Diagnosis not present

## 2023-02-05 DIAGNOSIS — K4090 Unilateral inguinal hernia, without obstruction or gangrene, not specified as recurrent: Secondary | ICD-10-CM | POA: Insufficient documentation

## 2023-02-05 LAB — CBC WITH DIFFERENTIAL/PLATELET
Abs Immature Granulocytes: 0.02 10*3/uL (ref 0.00–0.07)
Basophils Absolute: 0 10*3/uL (ref 0.0–0.1)
Basophils Relative: 1 %
Eosinophils Absolute: 0.1 10*3/uL (ref 0.0–0.5)
Eosinophils Relative: 2 %
HCT: 43.2 % (ref 39.0–52.0)
Hemoglobin: 13.6 g/dL (ref 13.0–17.0)
Immature Granulocytes: 0 %
Lymphocytes Relative: 29 %
Lymphs Abs: 1.9 10*3/uL (ref 0.7–4.0)
MCH: 29.8 pg (ref 26.0–34.0)
MCHC: 31.5 g/dL (ref 30.0–36.0)
MCV: 94.7 fL (ref 80.0–100.0)
Monocytes Absolute: 0.7 10*3/uL (ref 0.1–1.0)
Monocytes Relative: 10 %
Neutro Abs: 3.7 10*3/uL (ref 1.7–7.7)
Neutrophils Relative %: 58 %
Platelets: 178 10*3/uL (ref 150–400)
RBC: 4.56 MIL/uL (ref 4.22–5.81)
RDW: 15 % (ref 11.5–15.5)
WBC: 6.5 10*3/uL (ref 4.0–10.5)
nRBC: 0 % (ref 0.0–0.2)

## 2023-02-05 LAB — URINALYSIS, ROUTINE W REFLEX MICROSCOPIC
Bilirubin Urine: NEGATIVE
Glucose, UA: NEGATIVE mg/dL
Hgb urine dipstick: NEGATIVE
Ketones, ur: NEGATIVE mg/dL
Leukocytes,Ua: NEGATIVE
Nitrite: NEGATIVE
Protein, ur: NEGATIVE mg/dL
Specific Gravity, Urine: >1.046 — ABNORMAL HIGH (ref 1.005–1.030)
pH: 5 (ref 5.0–8.0)

## 2023-02-05 LAB — COMPREHENSIVE METABOLIC PANEL
ALT: 11 U/L (ref 0–44)
AST: 15 U/L (ref 15–41)
Albumin: 3.3 g/dL — ABNORMAL LOW (ref 3.5–5.0)
Alkaline Phosphatase: 72 U/L (ref 38–126)
Anion gap: 12 (ref 5–15)
BUN: 15 mg/dL (ref 8–23)
CO2: 22 mmol/L (ref 22–32)
Calcium: 8.7 mg/dL — ABNORMAL LOW (ref 8.9–10.3)
Chloride: 106 mmol/L (ref 98–111)
Creatinine, Ser: 1.19 mg/dL (ref 0.61–1.24)
GFR, Estimated: 60 mL/min — ABNORMAL LOW (ref >60)
Glucose, Bld: 106 mg/dL — ABNORMAL HIGH (ref 70–99)
Potassium: 3.7 mmol/L (ref 3.5–5.1)
Sodium: 140 mmol/L (ref 135–145)
Total Bilirubin: 0.7 mg/dL (ref 0.3–1.2)
Total Protein: 6.4 g/dL — ABNORMAL LOW (ref 6.5–8.1)

## 2023-02-05 MED ORDER — ONDANSETRON HCL 4 MG/2ML IJ SOLN
4.0000 mg | Freq: Once | INTRAMUSCULAR | Status: AC
  Administered 2023-02-05: 4 mg via INTRAVENOUS
  Filled 2023-02-05: qty 2

## 2023-02-05 MED ORDER — HYDROCODONE-ACETAMINOPHEN 5-325 MG PO TABS
1.0000 | ORAL_TABLET | ORAL | 0 refills | Status: AC | PRN
Start: 2023-02-05 — End: ?

## 2023-02-05 MED ORDER — MORPHINE SULFATE (PF) 4 MG/ML IV SOLN
4.0000 mg | Freq: Once | INTRAVENOUS | Status: AC
  Administered 2023-02-05: 4 mg via INTRAVENOUS
  Filled 2023-02-05: qty 1

## 2023-02-05 MED ORDER — DEXAMETHASONE SODIUM PHOSPHATE 10 MG/ML IJ SOLN
10.0000 mg | Freq: Once | INTRAMUSCULAR | Status: AC
  Administered 2023-02-05: 10 mg via INTRAVENOUS
  Filled 2023-02-05: qty 1

## 2023-02-05 MED ORDER — RIVAROXABAN 15 MG PO TABS
15.0000 mg | ORAL_TABLET | Freq: Once | ORAL | Status: AC
  Administered 2023-02-05: 15 mg via ORAL
  Filled 2023-02-05: qty 1

## 2023-02-05 MED ORDER — IOHEXOL 350 MG/ML SOLN
75.0000 mL | Freq: Once | INTRAVENOUS | Status: AC | PRN
  Administered 2023-02-05: 75 mL via INTRAVENOUS

## 2023-02-05 MED ORDER — RIVAROXABAN (XARELTO) VTE STARTER PACK (15 & 20 MG): ORAL_TABLET | ORAL | 0 refills | Status: DC

## 2023-02-05 MED ORDER — RIVAROXABAN (XARELTO) EDUCATION KIT FOR DVT/PE PATIENTS
PACK | Freq: Once | Status: AC
  Filled 2023-02-05: qty 1

## 2023-02-05 NOTE — Telephone Encounter (Signed)
Patient called in to say that the pharmacy does not have xaralto , and they have called several places, they will have it Tuesday. Messaged with Dr Particia Nearing, he should continue taking eliquis until xaralto is in then take xaralto.  The patient expressed understanding.

## 2023-02-05 NOTE — Discharge Instructions (Addendum)
STOP Eliquis!  Information on my medicine - XARELTO (rivaroxaban)  WHY WAS XARELTO PRESCRIBED FOR YOU? Xarelto was prescribed to treat blood clots that may have been found in the veins of your legs (deep vein thrombosis) or in your lungs (pulmonary embolism) and to reduce the risk of them occurring again.  What do you need to know about Xarelto? The starting dose is one 15 mg tablet taken TWICE daily with food for the FIRST 21 DAYS then the dose is changed to one 20 mg tablet taken ONCE A DAY with your evening meal.  DO NOT stop taking Xarelto without talking to the health care provider who prescribed the medication.  Refill your prescription for 20 mg tablets before you run out.  After discharge, you should have regular check-up appointments with your healthcare provider that is prescribing your Xarelto.  In the future your dose may need to be changed if your kidney function changes by a significant amount.  What do you do if you miss a dose? If you are taking Xarelto TWICE DAILY and you miss a dose, take it as soon as you remember. You may take two 15 mg tablets (total 30 mg) at the same time then resume your regularly scheduled 15 mg twice daily the next day.  If you are taking Xarelto ONCE DAILY and you miss a dose, take it as soon as you remember on the same day then continue your regularly scheduled once daily regimen the next day. Do not take two doses of Xarelto at the same time.   Important Safety Information Xarelto is a blood thinner medicine that can cause bleeding. You should call your healthcare provider right away if you experience any of the following: Bleeding from an injury or your nose that does not stop. Unusual colored urine (red or dark brown) or unusual colored stools (red or black). Unusual bruising for unknown reasons. A serious fall or if you hit your head (even if there is no bleeding).  Some medicines may interact with Xarelto and might increase your  risk of bleeding while on Xarelto. To help avoid this, consult your healthcare provider or pharmacist prior to using any new prescription or non-prescription medications, including herbals, vitamins, non-steroidal anti-inflammatory drugs (NSAIDs) and supplements.  This website has more information on Xarelto: VisitDestination.com.br.

## 2023-02-05 NOTE — ED Triage Notes (Signed)
Patient arrives for evaluation of lower back pain x 2 days. Pain worse while seated and is relieved by standing. Denies injury. Taking Tylenol with temporary relief.

## 2023-02-05 NOTE — ED Provider Notes (Signed)
[place] EMERGENCY DEPARTMENT AT [facility] Provider Note   CSN: [id number] Arrival date & time: 02/05/23  4098     History  Chief Complaint  Patient presents with   Back Pain    Louis Bryan is a 87 y.o. male.  Pt is a 87 yo male with pmhx significant for htn, hld, hx PE (on Eliquis), and DM.  Pt said he's had low back pain for the past 2-3 days.  Sx have worsened.  Pain goes down his right leg.  No falls.  He is able to ambulate.  No bowel/bladder problems.       Home Medications Prior to Admission medications   Medication Sig Start Date End Date Taking? Authorizing Provider  HYDROcodone-acetaminophen (NORCO/VICODIN) 5-325 MG tablet Take 1 tablet by mouth every 4 (four) hours as needed. 02/05/23  Yes Jacalyn Lefevre, MD  RIVAROXABAN Carlena Hurl) VTE STARTER PACK (15 & 20 MG) Follow package directions: Take one 15mg  tablet by mouth twice a day. On day 22, switch to one 20mg  tablet once a day. Take with food. 02/05/23  Yes Jacalyn Lefevre, MD  acetaminophen (TYLENOL) 500 MG tablet Take 500 mg by mouth every 6 (six) hours as needed for moderate pain.    [provider]  allopurinol (ZYLOPRIM) 100 MG tablet Take 1 tablet (100 mg total) by mouth daily. 07/11/17   Porfirio Oar, PA  apixaban (ELIQUIS) 5 MG TABS tablet Take 5 mg by mouth 2 (two) times daily.    [provider]  Docusate Calcium (STOOL SOFTENER PO) Take 1 capsule by mouth every evening.    [provider]  HYDROcodone-acetaminophen (NORCO/VICODIN) 5-325 MG tablet Take 1 tablet by mouth every 4 (four) hours as needed. 11/06/21   Jeannie Fend, PA-C  losartan-hydrochlorothiazide (HYZAAR) 50-12.5 MG tablet Take 1 tablet by mouth daily. 08/28/17   Porfirio Oar, PA  metoprolol succinate (TOPROL-XL) 25 MG 24 hr tablet Take 1 tablet (25 mg total) by mouth daily. with food 08/28/17   Porfirio Oar, PA  oxymetazoline (AFRIN NASAL SPRAY) 0.05 % nasal spray Place 1 spray into both nostrils 2  (two) times daily. 01/12/23   Ashok Croon, MD  simvastatin (ZOCOR) 40 MG tablet Take 40 mg by mouth at bedtime. 09/20/21   [provider]  sodium chloride (OCEAN) 0.65 % SOLN nasal spray Place 1 spray into both nostrils as needed. 01/12/23   Ashok Croon, MD      Allergies    Patient has no known allergies.    Review of Systems   Review of Systems  Musculoskeletal:  Positive for back pain.  All other systems reviewed and are negative.   Physical Exam Updated Vital Signs BP 134/80 (BP Location: Left Arm)   Pulse 79   Temp 97.9 F (36.6 C) (Oral)   Resp 20   Ht 5\' 6"  (1.676 m)   Wt 81.6 kg   SpO2 95%   BMI 29.05 kg/m  Physical Exam Vitals and nursing note reviewed.  Constitutional:      Appearance: Normal appearance.  HENT:     Head: Normocephalic and atraumatic.     Right Ear: External ear normal.     Left Ear: External ear normal.     Nose: Nose normal.     Mouth/Throat:     Mouth: Mucous membranes are moist.     Pharynx: Oropharynx is clear.  Eyes:     Extraocular Movements: Extraocular movements intact.     Conjunctiva/sclera: Conjunctivae normal.  Pupils: Pupils are equal, round, and reactive to light.  Cardiovascular:     Rate and Rhythm: Normal rate and regular rhythm.     Pulses: Normal pulses.     Heart sounds: Normal heart sounds.  Pulmonary:     Effort: Pulmonary effort is normal.     Breath sounds: Normal breath sounds.  Abdominal:     General: Abdomen is flat. Bowel sounds are normal.     Palpations: Abdomen is soft.  Musculoskeletal:        General: Normal range of motion.     Cervical back: Normal range of motion and neck supple.  Skin:    General: Skin is warm.     Capillary Refill: Capillary refill takes less than 2 seconds.  Neurological:     General: No focal deficit present.     Mental Status: He is alert and oriented to person, place, and time.  Psychiatric:        Mood and Affect: Mood normal.        Behavior:  Behavior normal.     ED Results / Procedures / Treatments   Labs (all labs ordered are listed, but only abnormal results are displayed) Labs Reviewed  COMPREHENSIVE METABOLIC PANEL - Abnormal; Notable for the following components:      Result Value   Glucose, Bld 106 (*)    Calcium 8.7 (*)    Total Protein 6.4 (*)    Albumin 3.3 (*)    GFR, Estimated 60 (*)    All other components within normal limits  URINALYSIS, ROUTINE W REFLEX MICROSCOPIC - Abnormal; Notable for the following components:   Specific Gravity, Urine >1.046 (*)    All other components within normal limits  CBC WITH DIFFERENTIAL/PLATELET    EKG None  Radiology CT L-SPINE NO CHARGE  Result Date: 02/05/2023 CLINICAL DATA:  87 year old male with 2 days of low back pain, exacerbated by sitting. EXAM: CT LUMBAR SPINE WITH CONTRAST TECHNIQUE: Technique: Multiplanar CT images of the lumbar spine were reconstructed from contemporary CT of the Abdomen and Pelvis. RADIATION DOSE REDUCTION: This exam was performed according to the departmental dose-optimization program which includes automated exposure control, adjustment of the mA and/or kV according to patient size and/or use of iterative reconstruction technique. CONTRAST:  No additional COMPARISON:  CT Abdomen and Pelvis reported separately today. CT Abdomen and Pelvis 11/06/2021. FINDINGS: Segmentation: Normal. Alignment: Stable chronic straightening of lumbar lordosis. No significant scoliosis or spondylolisthesis. Vertebrae: Chronic osteopenia. Stable lumbar vertebral body height. Visible sacrum and SI joints appear intact. No acute osseous abnormality identified. Paraspinal and other soft tissues: Abdomen and pelvis detailed separately. Lumbar paraspinal soft tissues are within normal limits. Disc levels: Generally mild for age lumbar spine degeneration, stable from the CT Abdomen and Pelvis last year. Mild to moderate multifactorial lumbar spinal stenosis suspected at L4-L5,  chronic and stable. IMPRESSION: 1. Osteopenia. No acute osseous abnormality identified in the Lumbar Spine. 2. Generally mild for age lumbar spine degeneration, but up to moderate multifactorial spinal stenosis at L4-L5 appears chronic and stable. 3. See CT Abdomen and Pelvis reported separately. Electronically Signed   By: Odessa Fleming M.D.   On: 02/05/2023 11:42   CT ABDOMEN PELVIS W CONTRAST  Result Date: 02/05/2023 CLINICAL DATA:  87 year old male with 2 days of low back pain, exacerbated by sitting. EXAM: CT ABDOMEN AND PELVIS WITH CONTRAST TECHNIQUE: Multidetector CT imaging of the abdomen and pelvis was performed using the standard protocol following bolus administration of intravenous  contrast. RADIATION DOSE REDUCTION: This exam was performed according to the departmental dose-optimization program which includes automated exposure control, adjustment of the mA and/or kV according to patient size and/or use of iterative reconstruction technique. CONTRAST:  75mL OMNIPAQUE IOHEXOL 350 MG/ML SOLN COMPARISON:  Lumbar CT today reported separately. CT Abdomen and Pelvis 11/06/2021. FINDINGS: Lower chest: Stable mildly tortuous descending thoracic aorta. Normal cardiac size. No pericardial or pleural effusion. Small fat containing right Bochdalek's hernia, normal variant. Otherwise negative lung bases. Hepatobiliary: Chronic cholecystectomy, intra-and extrahepatic biliary ductal dilatation appears stable. Small benign a patent cyst adjacent to the patent IVC on series 3, image 13 is stable and appears simple (no follow-up imaging recommended). Pancreas: Negative. Spleen: Negative. Small left upper quadrant splenule, normal variant. Adrenals/Urinary Tract: Adrenal glands are stable, within normal limits. Chronic bilateral simple fluid density renal cysts (no follow-up imaging recommended). Symmetric renal enhancement. No delayed phase images. No hydronephrosis or hydroureter. Mildly distended but otherwise  unremarkable urinary bladder. Stomach/Bowel: Chronic severe diverticulosis throughout the large bowel, especially the descending and sigmoid segments. Herniated junction of that bowel is detailed below. No definite diverticular inflammation. Normal appendix series 3, image 44. No dilated small bowel. No herniated small bowel. Stomach and duodenum appear negative. No free air or free fluid. Vascular/Lymphatic: Aortoiliac calcified atherosclerosis. Normal caliber abdominal aorta. Major arterial structures remain patent. Portal venous system appears patent. No lymphadenopathy. Early contrast timing with respective venous structures but conspicuous heterogeneity of the right femoral vein which is intermittently opacified, more heterogeneous than the contralateral left femoral vein. See series 3, images 69, 81, 84. Reproductive: Chronic bowel containing left inguinal hernia. Herniated segment of colon with underlying severe diverticulosis. This is the junction of the descending and sigmoid colon segments, along with herniated mesentery. No upstream bowel obstruction. No convincing incarceration. Chronic thickening of the right inguinal ligament appears stable, significance doubtful. Chronic prostatomegaly. Other: No pelvis free fluid. Musculoskeletal: Osteopenia. Mild chronic T11 superior endplate compression is stable since last year. Lumbar spine detailed separately. No acute osseous abnormality identified. IMPRESSION: 1. Possible right femoral vein thrombosis, DVT. However, the appearance might be artifact due to venous mixing (but is substantially different than the Left side). Lower extremity Doppler Ultrasound would be confirmatory. 2. Lumbar spine CT reported separately today. 3. Chronic bowel containing left inguinal hernia. No bowel obstruction or convincing incarceration. Severe underlying large bowel diverticulosis. No active bowel inflammation identified. 4.  Aortic Atherosclerosis (ICD10-I70.0).  Electronically Signed   By: Odessa Fleming M.D.   On: 02/05/2023 11:39    Procedures Procedures    Medications Ordered in ED Medications  Rivaroxaban (XARELTO) tablet 15 mg (has no administration in time range)  rivaroxaban (XARELTO) Education Kit for DVT/PE patients (has no administration in time range)  dexamethasone (DECADRON) injection 10 mg (10 mg Intravenous Given 02/05/23 0840)  morphine (PF) 4 MG/ML injection 4 mg (4 mg Intravenous Given 02/05/23 1045)  ondansetron (ZOFRAN) injection 4 mg (4 mg Intravenous Given 02/05/23 1044)  iohexol (OMNIPAQUE) 350 MG/ML injection 75 mL (75 mLs Intravenous Contrast Given 02/05/23 1102)    ED Course/ Medical Decision Making/ A&P                                 Medical Decision Making Amount and/or Complexity of Data Reviewed Labs: ordered. Radiology: ordered.  Risk Prescription drug management.   This patient presents to the ED for concern of back pain, this involves an  extensive number of treatment options, and is a complaint that carries with it a high risk of complications and morbidity.  The differential diagnosis includes sciatica, msk, aaa, kidney stone, uti   Co morbidities that complicate the patient evaluation  htn, hld, hx PE (no longer on Eliquis), and DM   Additional history obtained:  Additional history obtained from epic chart review External records from outside source obtained and reviewed including family   Lab Tests:  I Ordered, and personally interpreted labs.  The pertinent results include:  cbc nl, cmp nl   Imaging Studies ordered:  I ordered imaging studies including ct abd/pelvis and ct lumbar  I independently visualized and interpreted imaging which showed  CT lumbar: Osteopenia. No acute osseous abnormality identified in the Lumbar  Spine.  2. Generally mild for age lumbar spine degeneration, but up to  moderate multifactorial spinal stenosis at L4-L5 appears chronic and  stable.  3. See CT Abdomen and  Pelvis reported separately.  CT abd/pelvis: Possible right femoral vein thrombosis, DVT. However, the  appearance might be artifact due to venous mixing (but is  substantially different than the Left side). Lower extremity Doppler  Ultrasound would be confirmatory.    2. Lumbar spine CT reported separately today.    3. Chronic bowel containing left inguinal hernia. No bowel  obstruction or convincing incarceration. Severe underlying large  bowel diverticulosis. No active bowel inflammation identified.    4.  Aortic Atherosclerosis (ICD10-I70.0).  [place] leg: acute DVT in the right peroneal and posterior tibial veins.  I agree with the radiologist interpretation   Cardiac Monitoring:  The patient was maintained on a cardiac monitor.  I personally viewed and interpreted the cardiac monitored which showed an underlying rhythm of: nsr   Medicines ordered and prescription drug management:  I ordered medication including decadron  for sx  Reevaluation of the patient after these medicines showed that the patient improved I have reviewed the patients home medicines and have made adjustments as needed   Test Considered:  Ct/us   Critical Interventions:  xarelto   Consultations Obtained:  I requested consultation with the vascular surgeon (Dr. Karin Lieu),  and discussed lab and imaging findings as well as pertinent plan -he recommends switching Eliquis to Xarelto   Problem List / ED Course:  Acute DVT on Eliquis:  pt said he's been compliant with Eliquis, so we will change him to Xarelto.  Pharmacy will talk to him prior to d/c.  He does not have any cp or sob, so if he has a PE, it is small and would not require admission.  He's already had contrast for ct abd/pelvis, so I don't think it's worth it to do a cta chest now.  Pt is told to f/u with the DVT clinic.  He is to return if worse.     Reevaluation:  After the interventions noted above, I reevaluated the patient and found  that they have :improved   Social Determinants of Health:  Lives at home   Dispostion:  After consideration of the diagnostic results and the patients response to treatment, I feel that the patent would benefit from discharge with outpatient f/u.          Final Clinical Impression(s) / ED Diagnoses Final diagnoses:  Acute deep vein thrombosis (DVT) of right peroneal vein (HCC)    Rx / DC Orders ED Discharge Orders          Ordered    AMB Referral to Deep Vein  Thrombosis Clinic       Comments: Please call [phone] with any questions.   02/05/23 1401    RIVAROXABAN (XARELTO) VTE STARTER PACK (15 & 20 MG)        02/05/23 1426    HYDROcodone-acetaminophen (NORCO/VICODIN) 5-325 MG tablet  Every 4 hours PRN        02/05/23 1426              Jacalyn Lefevre, MD 02/05/23 1432

## 2023-02-05 NOTE — Progress Notes (Signed)
VASCULAR LAB    Right lower extremity venous duplex has been performed.  See CV proc for preliminary results.  Messaged results to Dr. Trude Mcburney, [facility], RVT 02/05/2023, 1:03 PM

## 2023-02-05 NOTE — Telephone Encounter (Signed)
Please see duplicate for details

## 2023-02-10 ENCOUNTER — Other Ambulatory Visit (HOSPITAL_COMMUNITY): Payer: Self-pay

## 2023-02-13 ENCOUNTER — Ambulatory Visit (HOSPITAL_COMMUNITY)
Admission: RE | Admit: 2023-02-13 | Discharge: 2023-02-13 | Disposition: A | Discharge status: Home / Self Care | Payer: Medicare Other | Source: Ambulatory Visit | Attending: Vascular Surgery | Admitting: Vascular Surgery

## 2023-02-13 ENCOUNTER — Encounter (HOSPITAL_COMMUNITY): Payer: Self-pay

## 2023-02-13 VITALS — BP 90/58 | HR 101

## 2023-02-13 DIAGNOSIS — I82431 Acute embolism and thrombosis of right popliteal vein: Secondary | ICD-10-CM | POA: Insufficient documentation

## 2023-02-13 NOTE — Patient Instructions (Addendum)
-  Continue rivaroxaban (Xarelto) 15 mg twice daily with food for 21 days followed by 20 mg daily with food. -Call me when you get home to work on your Medicare Extra Help application: [phone] -It is important to take your medication around the same time every day.  -Avoid NSAIDs like ibuprofen (Advil, Motrin) and naproxen (Aleve) as well as aspirin doses over 100 mg daily. -Tylenol (acetaminophen) is the preferred over the counter pain medication to lower the risk of bleeding. -Be sure to alert all of your health care providers that you are taking an anticoagulant prior to starting a new medication or having a procedure. -Monitor for signs and symptoms of bleeding (abnormal bruising, prolonged bleeding, nose bleeds, bleeding from gums, discolored urine, black tarry stools). If you have fallen and hit your head OR if your bleeding is severe or not stopping, seek emergency care.  -Go to the emergency room if emergent signs and symptoms of new clot occur (new or worse swelling and pain in an arm or leg, shortness of breath, chest pain, fast or irregular heartbeats, lightheadedness, dizziness, fainting, coughing up blood) or if you experience a significant color change (pale or blue) in the extremity that has the DVT.  -We recommend you wear compression stockings (20-30 mmHg) as long as you are having swelling or pain. Be sure to purchase the correct size and take them off at night.   We will plan your follow up visit after you call me back this afternoon.  [facility] [address] [place], [place], [place] [address] Enter the hospital through Entrance [address] off [address] and pull up to the Heart & Vascular Center entrance to the free valet parking.  Check in for your appointment at the Heart & Vascular Center.   If you have any questions or need to reschedule an appointment, please call [phone] [facility].  If you are having an emergency, call 911 or present to the nearest  emergency room.   What is a DVT?  -Deep vein thrombosis (DVT) is a condition in which a blood clot forms in a vein of the deep venous system which can occur in the lower leg, thigh, pelvis, arm, or neck. This condition is serious and can be life-threatening if the clot travels to the arteries of the lungs and causing a blockage (pulmonary embolism, PE). A DVT can also damage veins in the leg, which can lead to long-term venous disease, leg pain, swelling, discoloration, and ulcers or sores (post-thrombotic syndrome).  -Treatment may include taking an anticoagulant medication to prevent more clots from forming and the current clot from growing, wearing compression stockings, and/or surgical procedures to remove or dissolve the clot.

## 2023-02-13 NOTE — Progress Notes (Addendum)
DVT Clinic Note  Name: Louis Bryan     MRN: [id number]     DOB: [birth year]     Sex: male  PCP: Porfirio Oar, PA  Today's Visit: Visit Information: Initial Visit  Referred to DVT Clinic by: Emergency Department - Dr. Particia Nearing Referred to CPP by: Dr. Karin Lieu Reason for referral:  Chief Complaint  Patient presents with   Med Management - DVT   HISTORY OF PRESENT ILLNESS: Louis Bryan is a 87 y.o. male who presents after diagnosis of DVT for medication management. He is walking independently and accompanied by his daughter [name]. He has a history of bilateral PE 10/2021 and has been on Eliquis since then. He presented to the ED 02/05/23 with right leg pain. Ultrasound showed acute DVT in the right popliteal vein. He had missed no doses of Eliquis. The ED provider consulted with Dr. Karin Lieu who recommended switching to Xarelto. He was referred to the DVT Clinic for outpatient follow up. He has had multiple ED visits for nosebleeds over the past year. He tells me he was seen by ENT and now uses vaseline and a nasal spray which has decreased his nosebleeds recently. He was unable to get the Xarelto starter pack from his pharmacy for a few days due to them having to order it. He is taking it as prescribed and with food. No abnormal bruising or bleeding. Denies missed doses thus far. He tells me his leg pain is much improved since he started taking it.   Positive Thrombotic Risk Factors: Previous VTE, Older Age Bleeding Risk Factors: Age >65 years, Anticoagulant therapy, Other (comment) (frequent nosebleeds)  Negative Thrombotic Risk Factors: Recent surgery (within 3 months), Recent trauma (within 3 months), Recent admission to hospital with acute illness (within 3 months), Central venous catheterization, Bed rest >72 hours within 3 months, Paralysis, paresis, or recent plaster cast immobilization of lower extremity, Sedentary journey lasting >8 hours within 4 weeks, Pregnancy, Within 6 weeks postpartum,  Recent cesarean section (within 3 months), Estrogen therapy, Testosterone therapy, Recent COVID diagnosis (within 3 months), Active cancer, Erythropoiesis-stimulating agent, Non-malignant, chronic inflammatory condition, Known thrombophilic condition, Smoking, Obesity  Rx Insurance Coverage: Medicare Rx Affordability: Eliquis was previously $47/month. Currently in the donut hole. Xarelto is $169/month which he cannot afford.  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 Positive for acute pulmonary embolus with CT evidence of right   Shortness of breath    On exertion   Tobacco abuse     Past Surgical History:  Procedure Laterality Date   CATARACT EXTRACTION Left 07/2016   CHOLECYSTECTOMY N/A 10/27/2012   Procedure: LAPAROSCOPIC CHOLECYSTECTOMY WITH INTRAOPERATIVE CHOLANGIOGRAM;  Surgeon: Romie Levee, MD;  Location: [facility];  Service: General;  Laterality: N/A;   NM MYOVIEW LTD  12/29/2021   The study is normal. The study is low risk.   No ST deviation was noted => rate related RBBB, peak HR 126 bpm-no chronotropic incompetence.    Left ventricular function is normal. Nuclear stress EF: 61 %. The left ventricular ejection fraction is normal (55-65%). End  diastolic cavity size is normal.   Prior study available for comparison from 12/11/2003. No changes compared to prior study.   TRANSTHORACIC ECHOCARDIOGRAM  10/22/2021   Normal LV size and function.  EF 60 to 65%.  GR 1 DD.  Normal RV size and function.  Normal RSVP.  Normal  RAP.  Normal MV with mild AOV sclerosis with no stenosis.    Social History   Socioeconomic History   Marital status: Single    Spouse name: divorced   Number of children: 1   Years of education: Not on file   Highest education level: Not on file  Occupational History   Occupation: Retired    Comment: Systems analyst  Tobacco Use   Smoking status: Former    Current packs/day: 0.00    Types: Cigarettes    Quit date: 06/13/1992    Years since quitting: 30.6   Smokeless tobacco: Never  Vaping Use   Vaping status: Never Used  Substance and Sexual Activity   Alcohol use: No   Drug use: No   Sexual activity: Yes  Other Topics Concern   Not on file  Social History Narrative   He cuts his grass with a push mover in the summer time for exercise. He wants to start back walking.    His daughter lives with him.   One sister lives locally.   Social Determinants of Health   Financial Resource Strain: Low Risk  (08/02/2022)   Received from [facility]   Overall Financial Resource Strain (CARDIA)    Difficulty of Paying Living Expenses: Not hard at all  Food Insecurity: No Food Insecurity (08/02/2022)   [facility] from [facility]   Hunger Vital Sign    Worried About Running Out of Food in the Last Year: Never true    Ran Out of Food in the Last Year: Never true  Transportation Needs: No Transportation Needs (08/02/2022)   Received [facility] - Transportation    Lack of Transportation (Medical): No    Lack of Transportation (Non-Medical): No  Physical Activity: Unknown (08/02/2022)   Received from [facility]   Exercise Vital Sign    Days of Exercise per Week: 0 days    Minutes of Exercise per Session: Not on file  Stress: No Stress Concern Present (08/02/2022)   Received from [facility] - Occupational Stress Questionnaire    Feeling of Stress : Not at all  Social Connections: Socially Integrated (08/02/2022)   Received  from [facility]   Social Network    How would you rate your social network (family, work, friends)?: Good participation with social networks  Intimate Partner Violence: Unknown (08/12/2021)   Received from [facility]   HITS    Physically Hurt: Not on file    Insult or Talk Down To: Not on file    Threaten Physical Harm: Not on file    Scream or Curse: Not on file    Family History  Problem Relation Age of Onset   Diabetes Sister    Diabetes Sister     Allergies as of 02/13/2023   (No Known Allergies)    Current Outpatient Medications on File Prior to Encounter  Medication Sig Dispense Refill   allopurinol (ZYLOPRIM) 100 MG tablet Take 1 tablet (100 mg total) by mouth daily. 90 tablet 3   Docusate Calcium (STOOL SOFTENER PO) Take 1 capsule by mouth every evening.  HYDROcodone-acetaminophen (NORCO/VICODIN) 5-325 MG tablet Take 1 tablet by mouth every 4 (four) hours as needed. 10 tablet 0   losartan-hydrochlorothiazide (HYZAAR) 50-12.5 MG tablet Take 1 tablet by mouth daily. 90 tablet 3   metoprolol succinate (TOPROL-XL) 25 MG 24 hr tablet Take 1 tablet (25 mg total) by mouth daily. with food 90 tablet 3   oxymetazoline (AFRIN NASAL SPRAY) 0.05 % nasal spray Place 1 spray into both nostrils 2 (two) times daily. 30 mL 0   RIVAROXABAN (XARELTO) VTE STARTER PACK (15 & 20 MG) Follow package directions: Take one 15mg  tablet by mouth twice a day. On day 22, switch to one 20mg  tablet once a day. Take with food. 51 each 0   simvastatin (ZOCOR) 40 MG tablet Take 40 mg by mouth at bedtime.     sodium chloride (OCEAN) 0.65 % SOLN nasal spray Place 1 spray into both nostrils as needed. 30 mL 5   No current facility-administered medications on file prior to encounter.   REVIEW OF SYSTEMS:  Review of Systems  Respiratory:  Negative for shortness of breath.   Cardiovascular:  Negative for chest pain, palpitations and leg swelling.  Musculoskeletal:  Negative for myalgias.   Neurological:  Negative for dizziness and tingling.   PHYSICAL EXAMINATION:  Vitals:   02/13/23 1010  BP: (!) 90/58  Pulse: (!) 101  SpO2: 97%   Physical Exam Vitals reviewed.  Pulmonary:     Effort: Pulmonary effort is normal.  Musculoskeletal:        General: No tenderness.     Right lower leg: No edema.     Left lower leg: No edema.  Skin:    Findings: No bruising or erythema.  Psychiatric:        Mood and Affect: Mood normal.        Behavior: Behavior normal.        Thought Content: Thought content normal.   Villalta Score for Post-Thrombotic Syndrome: Pain: Absent Cramps: Absent Heaviness: Absent Paresthesia: Absent Pruritus: Absent Pretibial Edema: Absent Skin Induration: Absent Hyperpigmentation: Absent Redness: Absent Venous Ectasia: Absent Pain on calf compression: Absent Villalta Preliminary Score: 0 Is venous ulcer present?: No If venous ulcer is present and score is <15, then 15 points total are assigned: Absent Villalta Total Score: 0  LABS:  CBC     Component Value Date/Time   WBC 6.5 02/05/2023 0834   RBC 4.56 02/05/2023 0834   HGB 13.6 02/05/2023 0834   HGB 15.0 07/11/2017 1045   HCT 43.2 02/05/2023 0834   HCT 45.6 07/11/2017 1045   PLT 178 02/05/2023 0834   PLT 210 07/11/2017 1045   MCV 94.7 02/05/2023 0834   MCV 93 07/11/2017 1045   MCH 29.8 02/05/2023 0834   MCHC 31.5 02/05/2023 0834   RDW 15.0 02/05/2023 0834   RDW 14.7 07/11/2017 1045   LYMPHSABS 1.9 02/05/2023 0834   LYMPHSABS 3.3 (H) 07/11/2017 1045   MONOABS 0.7 02/05/2023 0834   EOSABS 0.1 02/05/2023 0834   EOSABS 0.2 07/11/2017 1045   BASOSABS 0.0 02/05/2023 0834   BASOSABS 0.0 07/11/2017 1045    Hepatic Function      Component Value Date/Time   PROT 6.4 (L) 02/05/2023 0834   PROT 7.5 07/11/2017 1045   ALBUMIN 3.3 (L) 02/05/2023 0834   ALBUMIN 4.3 07/11/2017 1045   AST 15 02/05/2023 0834   ALT 11 02/05/2023 0834   ALKPHOS 72 02/05/2023 0834   BILITOT 0.7  02/05/2023 0834   BILITOT 0.7 07/11/2017 1045  Renal Function   Lab Results  Component Value Date   CREATININE 1.19 02/05/2023   CREATININE 1.34 (H) 04/04/2022   CREATININE 1.36 (H) 11/06/2021    Estimated Creatinine Clearance: 45.5 mL/min (by C-G formula based on SCr of 1.19 mg/dL).   [facility] Vascular Lab Studies:  02/05/23 VAS [place] LOWER EXTREMITY VENOUS (DVT) RIGHT Summary:  RIGHT:  - Findings consistent with acute deep vein thrombosis involving the right  popliteal vein, and TP Trunk.  - No cystic structure found in the popliteal fossa.    LEFT:  - No evidence of common femoral vein obstruction.   ASSESSMENT: Location of DVT: Right popliteal vein, Right distal vein Cause of DVT: unprovoked  Patient with history of bilateral PE 10/2021 treated with Eliquis. No clear provoking event was found at that time. He endorses compliance with taking Eliquis daily. His pharmacy dispense history confirms this. Despite daily adherence with Eliquis, he developed an acute DVT in his right popliteal vein and tibioperoneal trunk. This appears to be unprovoked. The ED consulted Dr. Karin Lieu who recommended switching to Xarelto. The patient does have a history of nosebleeds on anticoagulation but has not had any worsening since switching to Xarelto. He is taking it correctly and has missed no doses since switching. Since this is his second VTE event and DVT developed despite adherence to Eliquis, we recommend indefinite anticoagulation as long as benefits outweigh risks.   He is unfortunately now in the coverage gap (donut hole) for Medicare. He was able to get the Xarelto starter pack for $0 using the one time free card the ED gave him. He cannot use the $10 copay card since he has Medicare, and this works only for [employer]. After discussing his income, it appears he would not qualify for Medicaid but may qualify for Medicare Extra Help. If approved, this would reduce cost of Xarelto to $11.20 for  a 90 day supply but would also make all of his Medicare costs more affordable going forward. I helped the patient and his daughter submit his Medicare Extra Help application today. If he is denied, then we can try for the [employer] donut hole program for Xarelto, which only reduces the cost to $89/month but would be an improvement from $169/month. Will follow the status of his application to determine next steps. Either way, will ensure he does not run out of medication. Fortunately, the donut hole will be eliminated in 2025, so this is the only year he will be dealing with this significant increase in copay.   PLAN: -Continue rivaroxaban (Xarelto) 15 mg twice daily with food for 21 days followed by 20 mg daily with food. -Expected duration of therapy: Indefinite. Therapy started on 02/08/23. -Patient educated on purpose, proper use and potential adverse effects of rivaroxaban (Xarelto). -Discussed importance of taking medication around the same time every day. -Advised patient of medications to avoid (NSAIDs, aspirin doses >100 mg daily). -Educated that Tylenol (acetaminophen) is the preferred analgesic to lower the risk of bleeding. -Advised patient to alert all providers of anticoagulation therapy prior to starting a new medication or having a procedure. -Emphasized importance of monitoring for signs and symptoms of bleeding (abnormal bruising, prolonged bleeding, nose bleeds, bleeding from gums, discolored urine, black tarry stools). -Educated patient to present to the ED if emergent signs and symptoms of new thrombosis occur. -Counseled patient to wear compression stockings daily, removing at night.   Follow up: Will follow the status of his Medicare Extra Help application to  determine next steps for ensuring continued access to Xarelto. Based on this, will plan additional DVT Clinic follow up as needed.   Pervis Hocking, PharmD, [place], CPP Deep Vein Thrombosis Clinic Clinical Pharmacist  Practitioner Office: [phone]  I have evaluated the patient's chart/imaging and refer this patient to the Clinical Pharmacist Practitioner for medication management. I have reviewed the CPP's documentation and agree with her assessment and plan. I was immediately available during the visit for questions and collaboration.   Victorino Sparrow, MD

## 2023-02-27 ENCOUNTER — Other Ambulatory Visit (HOSPITAL_COMMUNITY): Payer: Self-pay

## 2023-03-04 ENCOUNTER — Emergency Department (HOSPITAL_COMMUNITY)
Admission: EM | Admit: 2023-03-04 | Discharge: 2023-03-04 | Disposition: A | Discharge status: Home / Self Care | Payer: Medicare Other | Attending: Emergency Medicine | Admitting: Emergency Medicine

## 2023-03-04 ENCOUNTER — Other Ambulatory Visit: Payer: Self-pay

## 2023-03-04 ENCOUNTER — Encounter (HOSPITAL_COMMUNITY): Payer: Self-pay

## 2023-03-04 DIAGNOSIS — Z7901 Long term (current) use of anticoagulants: Secondary | ICD-10-CM | POA: Insufficient documentation

## 2023-03-04 DIAGNOSIS — R040 Epistaxis: Secondary | ICD-10-CM | POA: Insufficient documentation

## 2023-03-04 LAB — I-STAT CHEM 8, ED
BUN: 18 mg/dL (ref 8–23)
Calcium, Ion: 1.16 mmol/L (ref 1.15–1.40)
Chloride: 105 mmol/L (ref 98–111)
Creatinine, Ser: 1.3 mg/dL — ABNORMAL HIGH (ref 0.61–1.24)
Glucose, Bld: 113 mg/dL — ABNORMAL HIGH (ref 70–99)
HCT: 42 % (ref 39.0–52.0)
Hemoglobin: 14.3 g/dL (ref 13.0–17.0)
Potassium: 3.8 mmol/L (ref 3.5–5.1)
Sodium: 143 mmol/L (ref 135–145)
TCO2: 24 mmol/L (ref 22–32)

## 2023-03-04 LAB — CBC
HCT: 42.8 % (ref 39.0–52.0)
Hemoglobin: 13.9 g/dL (ref 13.0–17.0)
MCH: 30.4 pg (ref 26.0–34.0)
MCHC: 32.5 g/dL (ref 30.0–36.0)
MCV: 93.7 fL (ref 80.0–100.0)
Platelets: 191 10*3/uL (ref 150–400)
RBC: 4.57 MIL/uL (ref 4.22–5.81)
RDW: 15.1 % (ref 11.5–15.5)
WBC: 8.2 10*3/uL (ref 4.0–10.5)
nRBC: 0 % (ref 0.0–0.2)

## 2023-03-04 NOTE — Discharge Instructions (Signed)
Continue using your home medications including Afrin, nasal saline, and apply a thin layer of Vaseline inside the nostrils up to several times daily anytime you feel like they are becoming dry.  I recommend following up with the ENT physician regarding your frequent nosebleeds to see if they would like to change her treatment regimen at all.  Please return to the emergency department if you have severe nosebleed that cannot be controlled.

## 2023-03-04 NOTE — ED Provider Notes (Signed)
[place] EMERGENCY DEPARTMENT AT [facility] Provider Note   CSN: [id number] Arrival date & time: 03/04/23  3244     History  Chief Complaint  Patient presents with   Epistaxis    Louis Bryan is a 87 y.o. male with past medical history significant for previous DVT, PE who presents with concern for nosebleed from left nostril this morning.  Patient reports that after nosebleed stopped he spit up a small amount of blood.  He has not spit up any blood since then.  History of frequent nosebleeds in the past and is following with ENT.  He uses an intranasal spray, Afrin twice daily, as well as saline spray, and he swabs the inside of his nostrils with Vaseline prior to sleep, but continues to have occasional bleeding.  Patient reports that he wakes up most mornings with some bleeding but it usually stops quickly.  He denies any chest pain, shortness of breath.  He has not missed any doses of his Xarelto.   Epistaxis      Home Medications Prior to Admission medications   Medication Sig Start Date End Date Taking? Authorizing Provider  allopurinol (ZYLOPRIM) 100 MG tablet Take 1 tablet (100 mg total) by mouth daily. 07/11/17   Porfirio Oar, PA  Docusate Calcium (STOOL SOFTENER PO) Take 1 capsule by mouth every evening.    [provider]  HYDROcodone-acetaminophen (NORCO/VICODIN) 5-325 MG tablet Take 1 tablet by mouth every 4 (four) hours as needed. 02/05/23   Jacalyn Lefevre, MD  losartan-hydrochlorothiazide (HYZAAR) 50-12.5 MG tablet Take 1 tablet by mouth daily. 08/28/17   Porfirio Oar, PA  metoprolol succinate (TOPROL-XL) 25 MG 24 hr tablet Take 1 tablet (25 mg total) by mouth daily. with food 08/28/17   Porfirio Oar, PA  oxymetazoline (AFRIN NASAL SPRAY) 0.05 % nasal spray Place 1 spray into both nostrils 2 (two) times daily. 01/12/23   Ashok Croon, MD  RIVAROXABAN Carlena Hurl) VTE STARTER PACK (15 & 20 MG) Follow package directions: Take one 15mg  tablet by  mouth twice a day. On day 22, switch to one 20mg  tablet once a day. Take with food. 02/05/23   Jacalyn Lefevre, MD  simvastatin (ZOCOR) 40 MG tablet Take 40 mg by mouth at bedtime. 09/20/21   [provider]  sodium chloride (OCEAN) 0.65 % SOLN nasal spray Place 1 spray into both nostrils as needed. 01/12/23   Ashok Croon, MD      Allergies    Patient has no known allergies.    Review of Systems   Review of Systems  HENT:  Positive for nosebleeds.   All other systems reviewed and are negative.   Physical Exam Updated Vital Signs BP 134/85   Pulse 74   Temp 98.1 F (36.7 C) (Oral)   Resp 16   SpO2 97%  Physical Exam Vitals and nursing note reviewed.  Constitutional:      General: He is not in acute distress.    Appearance: Normal appearance.  HENT:     Head: Normocephalic and atraumatic.     Nose:     Comments: There is a small crusted lesion on the superior mucosa in the left nare with no active bleeding at this time.  No bleeding noted in the contralateral nare.  No bleeding at back of throat. Eyes:     General:        Right eye: No discharge.        Left eye: No discharge.  Cardiovascular:  Rate and Rhythm: Normal rate and regular rhythm.  Pulmonary:     Effort: Pulmonary effort is normal. No respiratory distress.  Musculoskeletal:        General: No deformity.  Skin:    General: Skin is warm and dry.  Neurological:     Mental Status: He is alert and oriented to person, place, and time.  Psychiatric:        Mood and Affect: Mood normal.        Behavior: Behavior normal.     ED Results / Procedures / Treatments   Labs (all labs ordered are listed, but only abnormal results are displayed) Labs Reviewed  I-STAT CHEM 8, ED - Abnormal; Notable for the following components:      Result Value   Creatinine, Ser 1.30 (*)    Glucose, Bld 113 (*)    All other components within normal limits  CBC    EKG None  Radiology No results  found.  Procedures Procedures    Medications Ordered in ED Medications - No data to display  ED Course/ Medical Decision Making/ A&P Clinical Course as of 03/04/23 1024  Sat Mar 04, 2023  0852 Coughing up blood, has some pooling in back of nose every morning. No bleeding at this time.  [CP]    Clinical Course User Index [CP] Olene Floss, PA-C                                 Medical Decision Making Amount and/or Complexity of Data Reviewed Labs: ordered.   This patient is a 87 y.o. male who presents to the ED for concern of epistaxis, coughing up blood this morning x 1.   Differential diagnoses prior to evaluation: Bronchitis, PE, will be in the oropharynx secondary to no epistaxis, versus other  Past Medical History / Social History / Additional history: Chart reviewed. Pertinent results include: DVT, PE, currently anticoagulated on Xarelto, frequent epistaxis  Physical Exam: Physical exam performed. The pertinent findings include: There is a small crusted lesion on the superior mucosa in the left nare with no active bleeding at this time.  No bleeding noted in the contralateral nare.  No bleeding at back of throat.  Medications / Treatment: Plan for lab work including i-STAT Chem-8 which is overall unremarkable, CBC which shows no anemia, discussed I recommend ENT follow-up if nosebleeds continue, otherwise would continue Afrin, nasal saline, and intranasal Vaseline at this time   Disposition: After consideration of the diagnostic results and the patients response to treatment, I feel that patient is stable for discharge at this time, no ongoing nosebleed, vital signs stable, I can see an area of crusting in the left nare which is hemostatic at this time.  He should continue his home treatments for his frequent nosebleeds and follow-up with ENT.   emergency department workup does not suggest an emergent condition requiring admission or immediate intervention beyond  what has been performed at this time. The plan is: as above. The patient is safe for discharge and has been instructed to return immediately for worsening symptoms, change in symptoms or any other concerns.  Final Clinical Impression(s) / ED Diagnoses Final diagnoses:  Left-sided epistaxis    Rx / DC Orders ED Discharge Orders     None         [place] 03/04/23 1024    Jacalyn Lefevre, MD 03/04/23 1154

## 2023-03-04 NOTE — ED Triage Notes (Signed)
Pt c.o nosebleed from left nostril this morning and after that he spit up blood. Pt denies headache or dizziness. No bleeding at this time. Pt has hx of the same, takes xarelto

## 2023-03-06 ENCOUNTER — Other Ambulatory Visit (HOSPITAL_COMMUNITY): Payer: Self-pay

## 2023-03-08 ENCOUNTER — Ambulatory Visit (HOSPITAL_COMMUNITY)
Admission: RE | Admit: 2023-03-08 | Discharge: 2023-03-08 | Disposition: A | Discharge status: Home / Self Care | Payer: Medicare Other | Source: Ambulatory Visit | Attending: Family Medicine | Admitting: Family Medicine

## 2023-03-08 DIAGNOSIS — I82431 Acute embolism and thrombosis of right popliteal vein: Secondary | ICD-10-CM | POA: Diagnosis present

## 2023-03-08 NOTE — Patient Instructions (Signed)
-  Continue rivaroxaban (Xarelto) 20 mg daily with food. -Call me when you get anything in the mail from [employer] about Medicare Extra Help.  -It is important to take your medication around the same time every day.  -Avoid NSAIDs like ibuprofen (Advil, Motrin) and naproxen (Aleve) as well as aspirin doses over 100 mg daily. -Tylenol (acetaminophen) is the preferred over the counter pain medication to lower the risk of bleeding. -Be sure to alert all of your health care providers that you are taking an anticoagulant prior to starting a new medication or having a procedure. -Monitor for signs and symptoms of bleeding (abnormal bruising, prolonged bleeding, nose bleeds, bleeding from gums, discolored urine, black tarry stools). If you have fallen and hit your head OR if your bleeding is severe or not stopping, seek emergency care.  -Go to the emergency room if emergent signs and symptoms of new clot occur (new or worse swelling and pain in an arm or leg, shortness of breath, chest pain, fast or irregular heartbeats, lightheadedness, dizziness, fainting, coughing up blood) or if you experience a significant color change (pale or blue) in the extremity that has the DVT.  -We recommend you wear compression stockings (20-30 mmHg) as long as you are having swelling or pain. Be sure to purchase the correct size and take them off at night.   Your next visit is on Tuesday November 26th at 9:30am.  [facility] D[facility] [address] [place], [place], [place] [address] Enter the hospital through Entrance C off [facility] and pull up to the Heart & Vascular Center entrance to the free valet parking.  Check in for your appointment at the Heart & Vascular Center.   If you have any questions or need to reschedule an appointment, please call [phone] [facility].  If you are having an emergency, call 911 or present to the nearest emergency room.   What is a DVT?  -Deep vein thrombosis (DVT) is  a condition in which a blood clot forms in a vein of the deep venous system which can occur in the lower leg, thigh, pelvis, arm, or neck. This condition is serious and can be life-threatening if the clot travels to the arteries of the lungs and causing a blockage (pulmonary embolism, PE). A DVT can also damage veins in the leg, which can lead to long-term venous disease, leg pain, swelling, discoloration, and ulcers or sores (post-thrombotic syndrome).  -Treatment may include taking an anticoagulant medication to prevent more clots from forming and the current clot from growing, wearing compression stockings, and/or surgical procedures to remove or dissolve the clot.

## 2023-03-08 NOTE — Progress Notes (Signed)
DVT Clinic Note  Name: Jaquise Bryan     MRN: [id number]     DOB: 1936/05/06     Sex: male  PCP: Porfirio Oar, PA  Today's Visit: Visit Information: Follow Up Visit  Referred to DVT Clinic by: Emergency Department - Dr. Particia Nearing Referred to CPP by: Dr. Chestine Spore Reason for referral:  Chief Complaint  Patient presents with   Med Management - DVT   HISTORY OF PRESENT ILLNESS: Louis Bryan is a 87 y.o. male with PMH who presents for follow up medication management for DVT. He has a history of bilateral PE 10/2021 and has been on Eliquis since then. He presented to the ED 02/05/23 with right leg pain. Ultrasound showed acute DVT in the right popliteal vein. He had missed no doses of Eliquis. The ED provider consulted with Dr. Karin Lieu who recommended switching to Xarelto. He was referred to the DVT Clinic for outpatient follow up. He has had multiple ED visits for nosebleeds over the past year. He tells me he was seen by ENT and now uses vaseline and a nasal spray which has decreased his nosebleeds recently. Last visit in DVT Clinic on 02/13/23 at which time I found the patient was in the donut hole which will make his refills of Xarelto unaffordable until 2025. After discussion of assistance options, it seemed the patient would meet criteria for Medicare Extra Help and I assisted him with submitting the application at that time and we are awaiting the result. He was seen in the ED 03/04/23 for a nosebleed with some blood coughed up the night prior, both of which had resolved and Xarelto was continued.   Today, patient arrives walking independently and accompanied by his daughter [name]. His leg pain has continued to improve with Xarelto. With the exception of the nosebleed over the weekend, he has had no abnormal bleeding or bruising. He is taking it as prescribed and with food. No abnormal bruising or bleeding. Denies missed doses thus far. His last day of medication from the starter pack is on Friday. He has not  received any information on an approval from Medicare Extra Help yet.   Positive Thrombotic Risk Factors: Previous VTE, Older Age Bleeding Risk Factors: Age >65 years, Anticoagulant therapy, Other (comment) (history of frequent nosebleeds, followed by ENT)  Negative Thrombotic Risk Factors: Recent surgery (within 3 months), Recent trauma (within 3 months), Recent admission to hospital with acute illness (within 3 months), Paralysis, paresis, or recent plaster cast immobilization of lower extremity, Central venous catheterization, Bed rest >72 hours within 3 months, Sedentary journey lasting >8 hours within 4 weeks, Pregnancy, Within 6 weeks postpartum, Recent cesarean section (within 3 months), Estrogen therapy, Testosterone therapy, Erythropoiesis-stimulating agent, Recent COVID diagnosis (within 3 months), Active cancer, Obesity, Non-malignant, chronic inflammatory condition, Known thrombophilic condition, Smoking  Rx Insurance Coverage: Medicare Rx Affordability: Eliquis or Xarelto would normally be $47/month on his part D plan but he is currently in the donut hole for the rest of the year which makes Xarelto $169 and he cannot afford. I've assisted the patient with submitting application for Medicare Extra Help and we are awaiting a response.  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 Positive for acute pulmonary embolus with CT evidence of right   Shortness of breath    On exertion   Tobacco abuse     Past Surgical  History:  Procedure Laterality Date   CATARACT EXTRACTION Left 07/2016   CHOLECYSTECTOMY N/A 10/27/2012   Procedure: LAPAROSCOPIC CHOLECYSTECTOMY WITH INTRAOPERATIVE CHOLANGIOGRAM;  Surgeon: Romie Levee, MD;  Location: [facility];  Service: General;  Laterality: N/A;   NM MYOVIEW LTD  12/29/2021   The study is normal. The study is low risk.   No ST deviation was noted => rate related RBBB, peak HR 126 bpm-no chronotropic incompetence.    Left ventricular function is normal. Nuclear stress EF: 61 %. The left ventricular ejection fraction is normal (55-65%). End diastolic cavity size is normal.   Prior study available for comparison from 12/11/2003. No changes compared to prior study.   TRANSTHORACIC ECHOCARDIOGRAM  10/22/2021   Normal LV size and function.  EF 60 to 65%.  GR 1 DD.  Normal RV size and function.  Normal RSVP.  Normal RAP.  Normal MV with mild AOV sclerosis with no stenosis.    Social History   Socioeconomic History   Marital status: Single    Spouse name: divorced   Number of children: 1   Years of education: Not on file   Highest education level: Not on file  Occupational History   Occupation: Retired    Comment: Systems analyst  Tobacco Use   Smoking status: Former    Current packs/day: 0.00    Types: Cigarettes    Quit date: 06/13/1992    Years since quitting: 30.7   Smokeless tobacco: Never  Vaping Use   Vaping status: Never Used  Substance and Sexual Activity   Alcohol use: No   Drug use: No   Sexual activity: Yes  Other Topics Concern   Not on file  Social History Narrative   He cuts his grass with a push mover in the summer time for exercise. He wants to start back walking.    His daughter lives with him.   One sister lives locally.   Social Determinants of Health   Financial Resource Strain: Low Risk  (08/02/2022)   Received from [facility]   Overall Financial Resource Strain (CARDIA)    Difficulty of Paying Living Expenses: Not hard at all  Food  Insecurity: No Food Insecurity (08/02/2022)   Received from [facility]   Hunger Vital Sign    Worried About Running Out of Food in the Last Year: Never true    Ran Out of Food in the Last Year: Never true  Transportation Needs: No Transportation Needs (08/02/2022)   Received from [facility] - Transportation    Lack of Transportation (Medical): No    Lack of Transportation (Non-Medical): No  Physical Activity: Unknown (08/02/2022)   Received from [facility]   Exercise Vital Sign    Days of Exercise per Week: 0 days    Minutes of Exercise per Session: Not on file  Stress: No Stress Concern Present (08/02/2022)   Received from [facility] - Occupational Stress Questionnaire    Feeling of Stress : Not at all  Social Connections: Socially Integrated (08/02/2022)   Received from [facility]   Social Network    How would you rate  your social network (family, work, friends)?: Good participation with social networks  Intimate Partner Violence: Unknown (08/12/2021)   Received from [employer]   HITS    Physically Hurt: Not on file    Insult or Talk Down To: Not on file    Threaten Physical Harm: Not on file    Scream or Curse: Not on file    Family History  Problem Relation Age of Onset   Diabetes Sister    Diabetes Sister     Allergies as of 03/08/2023   (No Known Allergies)    Current Outpatient Medications on File Prior to Encounter  Medication Sig Dispense Refill   RIVAROXABAN (XARELTO) VTE STARTER PACK (15 & 20 MG) Follow package directions: Take one 15mg  tablet by mouth twice a day. On day 22, switch to one 20mg  tablet once a day. Take with food. 51 each 0   allopurinol (ZYLOPRIM) 100 MG tablet Take 1 tablet (100 mg total) by mouth daily. 90 tablet 3   Docusate Calcium (STOOL SOFTENER PO) Take 1 capsule by mouth every evening.     HYDROcodone-acetaminophen (NORCO/VICODIN) 5-325 MG tablet Take 1 tablet  by mouth every 4 (four) hours as needed. 10 tablet 0   losartan-hydrochlorothiazide (HYZAAR) 50-12.5 MG tablet Take 1 tablet by mouth daily. 90 tablet 3   metoprolol succinate (TOPROL-XL) 25 MG 24 hr tablet Take 1 tablet (25 mg total) by mouth daily. with food 90 tablet 3   oxymetazoline (AFRIN NASAL SPRAY) 0.05 % nasal spray Place 1 spray into both nostrils 2 (two) times daily. 30 mL 0   simvastatin (ZOCOR) 40 MG tablet Take 40 mg by mouth at bedtime.     sodium chloride (OCEAN) 0.65 % SOLN nasal spray Place 1 spray into both nostrils as needed. 30 mL 5   No current facility-administered medications on file prior to encounter.   REVIEW OF SYSTEMS:  Review of Systems  Respiratory:  Negative for shortness of breath.   Cardiovascular:  Negative for chest pain, palpitations and leg swelling.  Musculoskeletal:  Negative for myalgias.  Neurological:  Negative for dizziness and tingling.   PHYSICAL EXAMINATION:  Physical Exam Musculoskeletal:        General: No swelling or tenderness.  Skin:    Findings: No bruising or erythema.  Psychiatric:        Mood and Affect: Mood normal.        Behavior: Behavior normal.        Thought Content: Thought content normal.   Villalta Score for Post-Thrombotic Syndrome: Pain: Absent Cramps: Mild Heaviness: Absent Paresthesia: Absent Pruritus: Absent Pretibial Edema: Absent Skin Induration: Absent Hyperpigmentation: Absent Redness: Absent Venous Ectasia: Absent Pain on calf compression: Absent Villalta Preliminary Score: 1 Is venous ulcer present?: No If venous ulcer is present and score is <15, then 15 points total are assigned: Absent Villalta Total Score: 1  LABS:  CBC     Component Value Date/Time   WBC 8.2 03/04/2023 0935   RBC 4.57 03/04/2023 0935   HGB 14.3 03/04/2023 0939   HGB 15.0 07/11/2017 1045   HCT 42.0 03/04/2023 0939   HCT 45.6 07/11/2017 1045   PLT 191 03/04/2023 0935   PLT 210 07/11/2017 1045   MCV 93.7 03/04/2023  0935   MCV 93 07/11/2017 1045   MCH 30.4 03/04/2023 0935   MCHC 32.5 03/04/2023 0935   RDW 15.1 03/04/2023 0935   RDW 14.7 07/11/2017 1045   LYMPHSABS 1.9 02/05/2023 0834   LYMPHSABS  3.3 (H) 07/11/2017 1045   MONOABS 0.7 02/05/2023 0834   EOSABS 0.1 02/05/2023 0834   EOSABS 0.2 07/11/2017 1045   BASOSABS 0.0 02/05/2023 0834   BASOSABS 0.0 07/11/2017 1045    Hepatic Function      Component Value Date/Time   PROT 6.4 (L) 02/05/2023 0834   PROT 7.5 07/11/2017 1045   ALBUMIN 3.3 (L) 02/05/2023 0834   ALBUMIN 4.3 07/11/2017 1045   AST 15 02/05/2023 0834   ALT 11 02/05/2023 0834   ALKPHOS 72 02/05/2023 0834   BILITOT 0.7 02/05/2023 0834   BILITOT 0.7 07/11/2017 1045    Renal Function   Lab Results  Component Value Date   CREATININE 1.30 (H) 03/04/2023   CREATININE 1.19 02/05/2023   CREATININE 1.34 (H) 04/04/2022    CrCl cannot be calculated (Unknown ideal weight.).   [facility] Vascular Lab Studies:  02/05/23 VAS [place] LOWER EXTREMITY VENOUS (DVT) RIGHT Summary:  RIGHT:  - Findings consistent with acute deep vein thrombosis involving the right  popliteal vein, and TP Trunk.  - No cystic structure found in the popliteal fossa.    LEFT:  - No evidence of common femoral vein obstruction.   ASSESSMENT: Location of DVT: Right popliteal vein, Right distal vein Cause of DVT: unprovoked  Patient with history of bilateral PE 10/2021 treated with Eliquis. No clear provoking event was found at that time. Despite daily adherence with Eliquis confirmed by patient and pharmacy dispense history, he developed an acute DVT in his right popliteal vein and tibioperoneal trunk on 02/05/23 which also appears to be unprovoked. The ED consulted Dr. Karin Lieu at that time who recommended switching to Xarelto. The patient does have a history of nosebleeds on anticoagulation and has had one since switching to Xarelto. He is taking it correctly and has missed no doses since switching. His DVT symptoms have nearly  resolved. Since this is his second VTE event and DVT developed despite adherence to Eliquis, we recommend indefinite anticoagulation as long as benefits outweigh risks.    He is unfortunately in the coverage gap (donut hole) for Medicare. He was able to get the Xarelto starter pack for $0 using the one time free card the ED gave him. He cannot use the $10 copay card since he has Medicare, and this works only for [employer]. After discussing his income during his last visit, it appeared he would not qualify for Medicaid but may qualify for Medicare Extra Help. If approved, this would reduce cost of Xarelto to $11.20 for a 90 day supply but would also make all of his Medicare costs more affordable going forward. I helped the patient and his daughter submit his Medicare Extra Help application 02/13/23 and we are still awaiting the result. He will run out of Xarelto on Friday. Provided patient with samples of Xarelto 20 mg which will hopefully last until a determination is made on his Extra Help application. Will continue to follow the status of his application to determine next steps and ensure he does not run out of medication. Fortunately, the donut hole will be eliminated in 2025, so this is the only year he will be dealing with this significant increase in copay.   PLAN: -Continue rivaroxaban (Xarelto) 20 mg daily with food. -Expected duration of therapy: Indefinite. Therapy started on 02/08/23. -Patient educated on purpose, proper use and potential adverse effects of rivaroxaban (Xarelto). -Discussed importance of taking medication around the same time every day. -Advised patient of medications to avoid (NSAIDs, aspirin doses >  100 mg daily). -Educated that Tylenol (acetaminophen) is the preferred analgesic to lower the risk of bleeding. -Advised patient to alert all providers of anticoagulation therapy prior to starting a new medication or having a procedure. -Emphasized importance of  monitoring for signs and symptoms of bleeding (abnormal bruising, prolonged bleeding, nose bleeds, bleeding from gums, discolored urine, black tarry stools). -Educated patient to present to the ED if emergent signs and symptoms of new thrombosis occur. -Counseled patient to wear compression stockings daily, removing at night. Continue elevating leg as needed to help with swelling.   Follow up: 1 month in DVT Clinic to ensure continued medication access.   Pervis Hocking, PharmD, [place], CPP Deep [facility]in Thrombosis Clinic Clinical Pharmacist Practitioner Office: [phone]

## 2023-03-17 ENCOUNTER — Other Ambulatory Visit (HOSPITAL_COMMUNITY): Payer: Self-pay

## 2023-03-17 ENCOUNTER — Ambulatory Visit: Payer: Medicare Other | Attending: Nurse Practitioner | Admitting: Nurse Practitioner

## 2023-03-17 NOTE — Progress Notes (Deleted)
Office Visit    Patient Name: Truong Milham Date of Encounter: 03/17/2023  Primary Care Provider:  Porfirio Oar, PA Primary Cardiologist:  Bryan Lemma, MD  Chief Complaint    87 year old male with a history of PE, DVT, medication related chronotropic incompetence, hypertension, hyperlipidemia, COPD, and tobacco use who presents for follow-up related to hypertension.   Past Medical History    Past Medical History:  Diagnosis Date   Chronotropic incompetence - medication related 09/24/2012   History of exercise stress test [id number]   negative bruce protocol excercise stress test with scintigraphic evidence of diaphragmatic attenuation and mild apical thinning, dynamic gating was not performed secondary to frequent ectopy, low risk study   Hyperlipidemia    Hypertension    Pulmonary embolism, bilateral (HCC) 10/21/2021   CTA chest: 1. Positive for acute pulmonary embolus with CT evidence of right   Shortness of breath    On exertion   Tobacco abuse    Past Surgical History:  Procedure Laterality Date   CATARACT EXTRACTION Left 07/2016   CHOLECYSTECTOMY N/A 10/27/2012   Procedure: LAPAROSCOPIC CHOLECYSTECTOMY WITH INTRAOPERATIVE CHOLANGIOGRAM;  Surgeon: Romie Levee, MD;  Location: [facility];  Service: General;  Laterality: N/A;   NM MYOVIEW LTD  12/29/2021   The study is normal. The study is low risk.   No ST deviation was noted => rate related RBBB, peak HR 126 bpm-no chronotropic incompetence.    Left ventricular function is normal. Nuclear stress EF: 61 %. The left ventricular ejection fraction is normal (55-65%). End diastolic cavity size is normal.   Prior study available for comparison from 12/11/2003. No changes compared to prior study.   TRANSTHORACIC ECHOCARDIOGRAM  10/22/2021   Normal LV size and function.  EF 60 to 65%.  GR 1 DD.  Normal RV size and function.  Normal RSVP.  Normal RAP.  Normal MV with mild AOV sclerosis with no stenosis.    Allergies  No Known  Allergies   Labs/Other Studies Reviewed    The following studies were reviewed today:  Cardiac Studies & Procedures     STRESS TESTS  MYOCARDIAL PERFUSION IMAGING 12/29/2021  Narrative   The study is normal. The study is low risk.   No ST deviation was noted.   Left ventricular function is normal. Nuclear stress EF: 61 %. The left ventricular ejection fraction is normal (55-65%). End diastolic cavity size is normal.   Prior study available for comparison from 12/11/2003. No changes compared to prior study.  Low risk stress nuclear study with normal perfusion and normal left ventricular regional and global systolic function. Rate related RBBB is noted.   ECHOCARDIOGRAM  ECHOCARDIOGRAM COMPLETE 10/22/2021  Narrative ECHOCARDIOGRAM REPORT    Patient Name:   Agastya Charney Date of Exam: 10/22/2021 Medical Rec #:  [id number]  Height:       65.0 in Accession #:    [id number] Weight:       170.0 lb Date of Birth:  07-Feb-1936   BSA:          1.846 m Patient Age:    86 years   BP:           122/96 mmHg Patient Gender: M          HR:           96 bpm. Exam Location:  Inpatient  Procedure: 2D Echo, Color Doppler and Cardiac Doppler  Indications:    I26.02 Pulmonary embolus  History:  Patient has no prior history of Echocardiogram examinations. Risk Factors:Hypertension, Diabetes and Dyslipidemia.  Sonographer:    Irving Burton Senior RDCS Referring Phys: [id number] Cecille Po MELVIN   Sonographer Comments: Suboptimal parasternal window due to lung interference. IMPRESSIONS   1. Left ventricular ejection fraction, by estimation, is 60 to 65%. The left ventricle has normal function. The left ventricle has no regional wall motion abnormalities. Left ventricular diastolic parameters are consistent with Grade I diastolic dysfunction (impaired relaxation). 2. Right ventricular systolic function is normal. The right ventricular size is normal. There is normal pulmonary artery systolic  pressure. 3. The mitral valve is normal in structure. No evidence of mitral valve regurgitation. No evidence of mitral stenosis. 4. The aortic valve is tricuspid. There is mild calcification of the aortic valve. Aortic valve regurgitation is not visualized. Aortic valve sclerosis is present, with no evidence of aortic valve stenosis. 5. The inferior vena cava is normal in size with greater than 50% respiratory variability, suggesting right atrial pressure of 3 mmHg.  FINDINGS Left Ventricle: Left ventricular ejection fraction, by estimation, is 60 to 65%. The left ventricle has normal function. The left ventricle has no regional wall motion abnormalities. The left ventricular internal cavity size was normal in size. There is no left ventricular hypertrophy. Left ventricular diastolic parameters are consistent with Grade I diastolic dysfunction (impaired relaxation).  Right Ventricle: The right ventricular size is normal. No increase in right ventricular wall thickness. Right ventricular systolic function is normal. There is normal pulmonary artery systolic pressure. The tricuspid regurgitant velocity is 2.46 m/s, and with an assumed right atrial pressure of 3 mmHg, the estimated right ventricular systolic pressure is 27.2 mmHg.  Left Atrium: Left atrial size was normal in size.  Right Atrium: Right atrial size was normal in size.  Pericardium: There is no evidence of pericardial effusion.  Mitral Valve: The mitral valve is normal in structure. No evidence of mitral valve regurgitation. No evidence of mitral valve stenosis.  Tricuspid Valve: The tricuspid valve is normal in structure. Tricuspid valve regurgitation is not demonstrated. No evidence of tricuspid stenosis.  Aortic Valve: The aortic valve is tricuspid. There is mild calcification of the aortic valve. Aortic valve regurgitation is not visualized. Aortic valve sclerosis is present, with no evidence of aortic valve stenosis.  Pulmonic  Valve: The pulmonic valve was normal in structure. Pulmonic valve regurgitation is trivial. No evidence of pulmonic stenosis.  Aorta: The aortic root is normal in size and structure.  Venous: The inferior vena cava is normal in size with greater than 50% respiratory variability, suggesting right atrial pressure of 3 mmHg.  IAS/Shunts: No atrial level shunt detected by color flow Doppler.   LEFT VENTRICLE PLAX 2D LVIDd:         3.90 cm   Diastology LVIDs:         2.90 cm   LV e' medial:    5.66 cm/s LV PW:         0.90 cm   LV E/e' medial:  8.6 LV IVS:        0.80 cm   LV e' lateral:   7.29 cm/s LVOT diam:     2.20 cm   LV E/e' lateral: 6.7 LV SV:         90 LV SV Index:   49 LVOT Area:     3.80 cm   RIGHT VENTRICLE RV S prime:     12.60 cm/s TAPSE (M-mode): 2.1 cm  LEFT ATRIUM  Index        RIGHT ATRIUM           Index LA diam:        2.80 cm 1.52 cm/m   RA Area:     13.80 cm LA Vol (A2C):   50.0 ml 27.08 ml/m  RA Volume:   31.50 ml  17.06 ml/m LA Vol (A4C):   31.9 ml 17.28 ml/m LA Biplane Vol: 43.0 ml 23.29 ml/m AORTIC VALVE LVOT Vmax:   114.00 cm/s LVOT Vmean:  75.300 cm/s LVOT VTI:    0.238 m  AORTA Ao Root diam: 3.50 cm  MITRAL VALVE               TRICUSPID VALVE MV Area (PHT): 1.99 cm    TR Peak grad:   24.2 mmHg MV Decel Time: 381 msec    TR Vmax:        246.00 cm/s MV E velocity: 48.80 cm/s MV A velocity: 77.10 cm/s  SHUNTS MV E/A ratio:  0.63        Systemic VTI:  0.24 m Systemic Diam: 2.20 cm  Charlton Haws MD Electronically signed by Charlton Haws MD Signature Date/Time: 10/22/2021/11:33:34 AM    Final            Recent Labs: 02/05/2023: ALT 11 03/04/2023: BUN 18; Creatinine, Ser 1.30; Hemoglobin 14.3; Platelets 191; Potassium 3.8; Sodium 143  Recent Lipid Panel    Component Value Date/Time   CHOL 164 07/11/2017 1045   TRIG 123 07/11/2017 1045   HDL 53 07/11/2017 1045   CHOLHDL 3.1 07/11/2017 1045   CHOLHDL 3.0 07/23/2015 1029    VLDL 27 07/23/2015 1029   LDLCALC 86 07/11/2017 1045    History of Present Illness    87 year old male with the above past medical history including PE, DVT, medication related chronotropic incompetence, hypertension, hyperlipidemia, COPD, and tobacco use.  He was hospitalized in 10/2021 in the setting of submassive PE. Echocardiogram at the time showed EF 60 to 65%, normal LV function, no RWMA, G1 DD, normal RV, normal PASP, no significant valvular abnormalities.  Nuclear stress test in 12/2021 was low risk, normal perfusion, normal left ventricular regional and global systolic function, rate related RBBB.  He was last seen in the office on 01/24/2022 and was stable from a cardiac standpoint.  He was diagnosed with acute DVT in the right popliteal vein in 01/2023. He was transitioned from Eliquis to Xarelto.  He was evaluated in the ED on 03/04/2023 in the setting of epistaxis.  He was advised to follow-up with ENT as an outpatient.  He presents today for follow-up. Since his last visit he has  History of PE/DVT: Hypertension: Hyperlipidemia: History of chronotropic incompetence: COPD/tobacco use: Epistaxis: Disposition:  Home Medications    Current Outpatient Medications  Medication Sig Dispense Refill   allopurinol (ZYLOPRIM) 100 MG tablet Take 1 tablet (100 mg total) by mouth daily. 90 tablet 3   Docusate Calcium (STOOL SOFTENER PO) Take 1 capsule by mouth every evening.     HYDROcodone-acetaminophen (NORCO/VICODIN) 5-325 MG tablet Take 1 tablet by mouth every 4 (four) hours as needed. 10 tablet 0   losartan-hydrochlorothiazide (HYZAAR) 50-12.5 MG tablet Take 1 tablet by mouth daily. 90 tablet 3   metoprolol succinate (TOPROL-XL) 25 MG 24 hr tablet Take 1 tablet (25 mg total) by mouth daily. with food 90 tablet 3   oxymetazoline (AFRIN NASAL SPRAY) 0.05 % nasal spray Place 1 spray into both nostrils 2 (two) times  daily. 30 mL 0   RIVAROXABAN (XARELTO) VTE STARTER PACK (15 & 20 MG)  Follow package directions: Take one 15mg  tablet by mouth twice a day. On day 22, switch to one 20mg  tablet once a day. Take with food. 51 each 0   simvastatin (ZOCOR) 40 MG tablet Take 40 mg by mouth at bedtime.     sodium chloride (OCEAN) 0.65 % SOLN nasal spray Place 1 spray into both nostrils as needed. 30 mL 5   No current facility-administered medications for this visit.     Review of Systems    ***.  All other systems reviewed and are otherwise negative except as noted above.    Physical Exam    VS:  There were no vitals taken for this visit. , BMI There is no height or weight on file to calculate BMI.     GEN: Well nourished, well developed, in no acute distress. HEENT: normal. Neck: Supple, no JVD, carotid bruits, or masses. Cardiac: RRR, no murmurs, rubs, or gallops. No clubbing, cyanosis, edema.  Radials/DP/PT 2+ and equal bilaterally.  Respiratory:  Respirations regular and unlabored, clear to auscultation bilaterally. GI: Soft, nontender, nondistended, BS + x 4. MS: no deformity or atrophy. Skin: warm and dry, no rash. Neuro:  Strength and sensation are intact. Psych: Normal affect.  Accessory Clinical Findings    ECG personally reviewed by me today -    - no acute changes.   Lab Results  Component Value Date   WBC 8.2 03/04/2023   HGB 14.3 03/04/2023   HCT 42.0 03/04/2023   MCV 93.7 03/04/2023   PLT 191 03/04/2023   Lab Results  Component Value Date   CREATININE 1.30 (H) 03/04/2023   BUN 18 03/04/2023   NA 143 03/04/2023   K 3.8 03/04/2023   CL 105 03/04/2023   CO2 22 02/05/2023   Lab Results  Component Value Date   ALT 11 02/05/2023   AST 15 02/05/2023   ALKPHOS 72 02/05/2023   BILITOT 0.7 02/05/2023   Lab Results  Component Value Date   CHOL 164 07/11/2017   HDL 53 07/11/2017   LDLCALC 86 07/11/2017   TRIG 123 07/11/2017   CHOLHDL 3.1 07/11/2017    Lab Results  Component Value Date   HGBA1C 6.4 (H) 07/11/2017    Assessment & Plan     1.  ***  No BP recorded.  {Refresh Note OR Click here to enter BP  :1}***   Joylene Grapes, NP 03/17/2023, 6:21 AM

## 2023-03-27 ENCOUNTER — Other Ambulatory Visit (HOSPITAL_COMMUNITY): Payer: Self-pay

## 2023-04-03 ENCOUNTER — Other Ambulatory Visit (HOSPITAL_COMMUNITY): Payer: Self-pay

## 2023-04-03 NOTE — Progress Notes (Incomplete)
DVT Clinic Note  Name: Louis Bryan     MRN: [id number]     DOB: 25-Feb-1936     Sex: male  PCP: Porfirio Oar, PA  Today's Visit: Visit Information: Follow Up Visit  Referred to DVT Clinic by: Emergency Department - Dr. Particia Nearing Referred to CPP by: Dr. Randie Heinz Reason for referral:  Chief Complaint  Patient presents with   Med Management - DVT   HISTORY OF PRESENT ILLNESS: Louis Bryan is a 87 y.o. male who presents for follow up medication management after diagnosis of DVT. He has a history of bilateral PE 10/2021 and has been on Eliquis since then. He presented to the ED 02/05/23 with right leg pain. Ultrasound showed acute DVT in the right popliteal vein. He had missed no doses of Eliquis. The ED provider consulted with Dr. Karin Lieu of vascular surgery who recommended switching to Xarelto. He was referred to the DVT Clinic for outpatient follow up. He has had multiple ED visits for nosebleeds over the past year. He was seen by ENT and now uses vaseline and a nasal spray which has decreased his nosebleed frequency. Follow up visit in DVT Clinic was on 02/13/23 at which time I found the patient was in the donut hole which will make his refills of Xarelto unaffordable until 2025. After discussion of assistance options, it seemed the patient would meet criteria for Medicare Extra Help and I assisted him with submitting the application. He was seen in the ED 03/04/23 for a nosebleed with some blood coughed up the night prior, both of which had resolved and Xarelto was continued. Last seen in DVT Clinic on 03/08/23 at which time we were still awaiting the result of his Medicare Extra Help application. We provided him with medication samples to hopefully last until a determination was made.  Today patient reports he is doing well on Xarelto. Endorses frequent nosebleeds which is stable for him, but otherwise no abnormal bleeding or bruising. He uses saline nasal spray and vaseline in his nostrils daily which helps  with nosebleeds. Has not needed to use Afrin to stop a nosebleed yet. Denies missed doses of Xarelto and has 3 tablets remaining. He was denied from Medicare Extra Help because he was $26 over the annual income limit.  Positive Thrombotic Risk Factors: Previous VTE, Older Age Bleeding Risk Factors: Age >65 years, Anticoagulant therapy, Other (comment) ((history of frequent nosebleeds, followed by ENT))  Negative Thrombotic Risk Factors: Recent surgery (within 3 months), Recent trauma (within 3 months), Recent admission to hospital with acute illness (within 3 months), Paralysis, paresis, or recent plaster cast immobilization of lower extremity, Bed rest >72 hours within 3 months, Central venous catheterization, Pregnancy, Within 6 weeks postpartum, Testosterone therapy, Estrogen therapy, Sedentary journey lasting >8 hours within 4 weeks, Recent cesarean section (within 3 months), Erythropoiesis-stimulating agent, Recent COVID diagnosis (within 3 months), Active cancer, Known thrombophilic condition, Non-malignant, chronic inflammatory condition, Obesity, Smoking  Rx Insurance Coverage: Medicare Rx Affordability:  Eliquis or Xarelto would normally be $47/month on his part D plan but he is currently in the donut hole for the rest of the year which makes Xarelto $169 which he cannot afford. I've assisted the patient with submitting application for Medicare Extra Help, but he was denied as he was $26 over the income limit. We applied for the Xarelto coverage gap program to reduce the cost to $89 per 30 day supply.  Rx Assistance Provided:  Free 30-day trial card Medication samples Preferred Pharmacy: Rx sent  to [facility] to be put on file and then transferred to [facility] which is the process for Xarelto's coverage gap program  Past Medical History:  Diagnosis Date   Chronotropic incompetence - medication related 09/24/2012   History of exercise stress test [id number]   negative bruce protocol  excercise stress test with scintigraphic evidence of diaphragmatic attenuation and mild apical thinning, dynamic gating was not performed secondary to frequent ectopy, low risk study   Hyperlipidemia    Hypertension    Pulmonary embolism, bilateral (HCC) 10/21/2021   CTA chest: 1. Positive for acute pulmonary embolus with CT evidence of right   Shortness of breath    On exertion   Tobacco abuse     Past Surgical History:  Procedure Laterality Date   CATARACT EXTRACTION Left 07/2016   CHOLECYSTECTOMY N/A 10/27/2012   Procedure: LAPAROSCOPIC CHOLECYSTECTOMY WITH INTRAOPERATIVE CHOLANGIOGRAM;  Surgeon: Romie Levee, MD;  Location: [facility];  Service: General;  Laterality: N/A;   NM MYOVIEW LTD  12/29/2021   The study is normal. The study is low risk.   No ST deviation was noted => rate related RBBB, peak HR 126 bpm-no chronotropic incompetence.    Left ventricular function is normal. Nuclear stress EF: 61 %. The left ventricular ejection fraction is normal (55-65%). End diastolic cavity size is normal.   Prior study available for comparison from 12/11/2003. No changes compared to prior study.   TRANSTHORACIC ECHOCARDIOGRAM  10/22/2021   Normal LV size and function.  EF 60 to 65%.  GR 1 DD.  Normal RV size and function.  Normal RSVP.  Normal RAP.  Normal MV with mild AOV sclerosis with no stenosis.    Social History   Socioeconomic History   Marital status: Single    Spouse name: divorced   Number of children: 1   Years of education: Not on file   Highest education level: Not on file  Occupational History   Occupation: Retired    Comment: Systems analyst  Tobacco Use   Smoking status: Former    Current packs/day: 0.00    Types: Cigarettes    Quit date: 06/13/1992    Years since quitting: 30.8   Smokeless tobacco: Never  Vaping Use   Vaping status: Never Used  Substance and Sexual Activity   Alcohol use: No   Drug use: No   Sexual activity: Yes  Other Topics Concern   Not on file   Social History Narrative   He cuts his grass with a push mover in the summer time for exercise. He wants to start back walking.    His daughter lives with him.   One sister lives locally.   Social Determinants of Health   Financial Resource Strain: Low Risk  (08/02/2022)   Received [facility]   Overall Financial Resource Strain (CARDIA)    Difficulty of Paying Living Expenses: Not hard at all  Food Insecurity: No Food Insecurity (08/02/2022)   Received from [facility]   Hunger Vital Sign    Worried About Running Out of Food in the Last Year: Never true    Ran Out of Food in the Last Year: Never true  Transportation Needs: No Transportation Needs (08/02/2022)   Received from [facility] - Transportation    Lack of Transportation (Medical): No    Lack of Transportation (Non-Medical): No  Physical Activity: Unknown (08/02/2022)   [facility] from [facility]   Exercise Vital Sign    Days of Exercise  per Week: 0 days    Minutes of Exercise per Session: Not on file  Stress: No Stress Concern Present (08/02/2022)   Received from [facility] Occupational [facility] - Occupational Stress Questionnaire    Feeling of Stress : Not at all  Social Connections: Socially Integrated (08/02/2022)   Received from [facility]   Social Network    How would you rate your social network (family, work, friends)?: Good participation with social networks  Intimate Partner Violence: Unknown (08/12/2021)   Received from [facility]   HITS    Physically Hurt: Not on file    Insult or Talk Down To: Not on file    Threaten Physical Harm: Not on file    Scream or Curse: Not on file    Family History  Problem Relation Age of Onset   Diabetes Sister    Diabetes Sister     Allergies as of 04/04/2023   (No Known Allergies)    Current Outpatient Medications on File Prior to Encounter  Medication Sig Dispense Refill   allopurinol (ZYLOPRIM) 100  MG tablet Take 1 tablet (100 mg total) by mouth daily. 90 tablet 3   Docusate Calcium (STOOL SOFTENER PO) Take 1 capsule by mouth every evening.     HYDROcodone-acetaminophen (NORCO/VICODIN) 5-325 MG tablet Take 1 tablet by mouth every 4 (four) hours as needed. 10 tablet 0   losartan-hydrochlorothiazide (HYZAAR) 50-12.5 MG tablet Take 1 tablet by mouth daily. 90 tablet 3   metoprolol succinate (TOPROL-XL) 25 MG 24 hr tablet Take 1 tablet (25 mg total) by mouth daily. with food 90 tablet 3   oxymetazoline (AFRIN NASAL SPRAY) 0.05 % nasal spray Place 1 spray into both nostrils 2 (two) times daily. 30 mL 0   simvastatin (ZOCOR) 40 MG tablet Take 40 mg by mouth at bedtime.     sodium chloride (OCEAN) 0.65 % SOLN nasal spray Place 1 spray into both nostrils as needed. 30 mL 5   No current facility-administered medications on file prior to encounter.   REVIEW OF SYSTEMS:  Review of Systems  Respiratory:  Negative for shortness of breath.   Cardiovascular:  Negative for chest pain and palpitations.  Musculoskeletal:  Negative for myalgias.  Neurological:  Negative for dizziness and tingling.   PHYSICAL EXAMINATION:  Vitals:   04/04/23 0935  BP: 123/81  Pulse: 96  SpO2: 98%    Physical Exam Cardiovascular:     Rate and Rhythm: Normal rate.  Pulmonary:     Effort: Pulmonary effort is normal.  Musculoskeletal:        General: No swelling or tenderness.  Psychiatric:        Mood and Affect: Mood normal.   Villalta Score for Post-Thrombotic Syndrome: Pain: Absent Cramps: Absent Heaviness: Absent Paresthesia: Absent Pruritus: Absent Pretibial Edema: Absent Skin Induration: Absent Hyperpigmentation: Absent Redness: Absent Venous Ectasia: Absent Pain on calf compression: Absent Villalta Preliminary Score: 0 Is venous ulcer present?: No If venous ulcer is present and score is <15, then 15 points total are assigned: Absent Villalta Total Score: 0  LABS:  CBC     Component Value  Date/Time   WBC 8.2 03/04/2023 0935   RBC 4.57 03/04/2023 0935   HGB 14.3 03/04/2023 0939   HGB 15.0 07/11/2017 1045   HCT 42.0 03/04/2023 0939   HCT 45.6 07/11/2017 1045   PLT 191 03/04/2023 0935   PLT 210 07/11/2017 1045   MCV 93.7 03/04/2023 0935  MCV 93 07/11/2017 1045   MCH 30.4 03/04/2023 0935   MCHC 32.5 03/04/2023 0935   RDW 15.1 03/04/2023 0935   RDW 14.7 07/11/2017 1045   LYMPHSABS 1.9 02/05/2023 0834   LYMPHSABS 3.3 (H) 07/11/2017 1045   MONOABS 0.7 02/05/2023 0834   EOSABS 0.1 02/05/2023 0834   EOSABS 0.2 07/11/2017 1045   BASOSABS 0.0 02/05/2023 0834   BASOSABS 0.0 07/11/2017 1045    Hepatic Function      Component Value Date/Time   PROT 6.4 (L) 02/05/2023 0834   PROT 7.5 07/11/2017 1045   ALBUMIN 3.3 (L) 02/05/2023 0834   ALBUMIN 4.3 07/11/2017 1045   AST 15 02/05/2023 0834   ALT 11 02/05/2023 0834   ALKPHOS 72 02/05/2023 0834   BILITOT 0.7 02/05/2023 0834   BILITOT 0.7 07/11/2017 1045    Renal Function   Lab Results  Component Value Date   CREATININE 1.30 (H) 03/04/2023   CREATININE 1.19 02/05/2023   CREATININE 1.34 (H) 04/04/2022    CrCl cannot be calculated (Patient's most recent lab result is older than the maximum 21 days allowed.).   VVS Vascular Lab Studies:  02/05/23 VAS [place] LOWER EXTREMITY VENOUS (DVT) RIGHT Summary:  RIGHT:  - Findings consistent with acute deep vein thrombosis involving the right  popliteal vein, and TP Trunk.  - No cystic structure found in the popliteal fossa.    LEFT:  - No evidence of common femoral vein obstruction  ASSESSMENT: Location of DVT: Right popliteal vein, Right distal vein Cause of DVT: unprovoked  Patient with history of bilateral PE 10/2021 treated with Eliquis. No clear provoking event was found at that time. Despite daily adherence with Eliquis confirmed by patient and pharmacy dispense history, he developed an acute DVT in his right popliteal vein and tibioperoneal trunk on 02/05/23 which also  appears to be unprovoked. The ED consulted Dr. Karin Lieu at that time who recommended switching to Xarelto. The patient does have a history of nosebleeds on anticoagulation and has had some since switching to Xarelto. He is taking it correctly and has missed no doses since switching. His DVT symptoms have nearly resolved. Since this is his second VTE event and DVT developed despite adherence to Eliquis, we recommend indefinite anticoagulation as long as benefits outweigh risks.    He is unfortunately in the coverage gap (donut hole) for Medicare. He was able to get the Xarelto starter pack for $0 using the one time free card the ED gave him. We helped him apply for Medicare Extra Help, but he was denied as he was $26 over the income limit. Since he was denied our only option was to apply for the Xarelto coverage gap program which reduces the cost to $89 per 1 month supply of medication. We helped him apply during the visit and provided him with 2 weeks of medication samples to last until he is able to get medication shipped to him through the Xarelto coverage gap program. This was the last of our Xarelto samples so we do not have any more free medication to give him after this. Medication through the manufacturer's program will be delivered to his home from [facility]. We provided him with their phone number and instructed him to call if he does not hear from them or receive the shipment notification in the next week. Encouraged him to reach out to [place] as well if he has any issues getting access to medication. Fortunately, the donut hole will be eliminated in 2025,  so this should not be an issue next year. He will only have to pay the $89 this one time and then copay should go back to $47/month after the new year which he is able to afford.   PLAN: -Continue rivaroxaban (Xarelto) 20 mg daily with food. -Expected duration of therapy: Indefinite. Therapy started on 02/08/23. -Patient educated on  purpose, proper use and potential adverse effects of rivaroxaban (Xarelto). -Discussed importance of taking medication around the same time every day. -Advised patient of medications to avoid (NSAIDs, aspirin doses >100 mg daily). -Educated that Tylenol (acetaminophen) is the preferred analgesic to lower the risk of bleeding. -Advised patient to alert all providers of anticoagulation therapy prior to starting a new medication or having a procedure. -Emphasized importance of monitoring for signs and symptoms of bleeding (abnormal bruising, prolonged bleeding, nose bleeds, bleeding from gums, discolored urine, black tarry stools). -Educated patient to present to the ED if emergent signs and symptoms of new thrombosis occur.  Follow up: DVT Clinic in January to ensure medication access. Will arrange for refills at that time.   Jarrett Ables, PharmD PGY-1 Pharmacy Resident  Pervis Hocking, PharmD, BCACP, CPP Deep Vein Thrombosis Clinic Clinical Pharmacist Practitioner Office: [phone]

## 2023-04-04 ENCOUNTER — Ambulatory Visit (HOSPITAL_COMMUNITY)
Admission: RE | Admit: 2023-04-04 | Discharge: 2023-04-04 | Disposition: A | Discharge status: Home / Self Care | Payer: Medicare Other | Source: Ambulatory Visit | Attending: Vascular Surgery | Admitting: Vascular Surgery

## 2023-04-04 ENCOUNTER — Other Ambulatory Visit (HOSPITAL_COMMUNITY): Payer: Self-pay

## 2023-04-04 VITALS — BP 123/81 | HR 96

## 2023-04-04 DIAGNOSIS — I82431 Acute embolism and thrombosis of right popliteal vein: Secondary | ICD-10-CM | POA: Diagnosis present

## 2023-04-04 MED ORDER — RIVAROXABAN 20 MG PO TABS
20.0000 mg | ORAL_TABLET | Freq: Every day | ORAL | 0 refills | Status: DC
  Filled 2023-04-04: qty 30, 30d supply, fill #0

## 2023-04-04 NOTE — Patient Instructions (Signed)
-  Continue taking Xarelto 20 mg daily with food -We signed you up for the Xarelto manufacturer donut hold program. With this program the medication will be $89 for a 1 month supply. The medication will be delivered to your home from [facility]. Please be on the look out for phone calls and reach out to them if you do not hear from them in the next week. Their phone number is [phone]. -It is important to take your medication around the same time every day.  -Avoid NSAIDs like ibuprofen (Advil, Motrin) and naproxen (Aleve) as well as aspirin doses over 100 mg daily. -Tylenol (acetaminophen) is the preferred over the counter pain medication to lower the risk of bleeding. -Be sure to alert all of your health care providers that you are taking an anticoagulant prior to starting a new medication or having a procedure. -Monitor for signs and symptoms of bleeding (abnormal bruising, prolonged bleeding, nose bleeds, bleeding from gums, discolored urine, black tarry stools). If you have fallen and hit your head OR if your bleeding is severe or not stopping, seek emergency care.  -Go to the emergency room if emergent signs and symptoms of new clot occur (new or worse swelling and pain in an arm or leg, shortness of breath, chest pain, fast or irregular heartbeats, lightheadedness, dizziness, fainting, coughing up blood) or if you experience a significant color change (pale or blue) in the extremity that has the DVT.  -We recommend you wear compression stockings (20-30 mmHg) as long as you are having swelling or pain. Be sure to purchase the correct size and take them off at night.   Your next visit is on January 8th at 9:00 AM.  [facility] [address] [place], [place], [place] [address] Enter the hospital through Entrance C off [facility] and pull up to the Heart & Vascular Center entrance to the free valet parking.  Check in for your appointment at the Heart &  Vascular Center.   If you have any questions or need to reschedule an appointment, please call [phone] [facility].  If you are having an emergency, call 911 or present to the nearest emergency room.   What is a DVT?  -Deep vein thrombosis (DVT) is a condition in which a blood clot forms in a vein of the deep venous system which can occur in the lower leg, thigh, pelvis, arm, or neck. This condition is serious and can be life-threatening if the clot travels to the arteries of the lungs and causing a blockage (pulmonary embolism, PE). A DVT can also damage veins in the leg, which can lead to long-term venous disease, leg pain, swelling, discoloration, and ulcers or sores (post-thrombotic syndrome).  -Treatment may include taking an anticoagulant medication to prevent more clots from forming and the current clot from growing, wearing compression stockings, and/or surgical procedures to remove or dissolve the clot.

## 2023-04-13 ENCOUNTER — Telehealth: Payer: Self-pay

## 2023-04-13 ENCOUNTER — Other Ambulatory Visit (HOSPITAL_COMMUNITY): Payer: Self-pay

## 2023-04-13 NOTE — Telephone Encounter (Signed)
Returned a call to [facility] regarding pt's Xarelto rx. I spoke with someone there who is requesting a fax of the rx. They state they are unable to call the [facility] pharmacy where the rx was initially sent and that they can only reach out to the pt. I then called [facility] pharmacy and spoke to them. Rx has not been filled. They are unsure of why [facility] is wanting this faxed to them and they reached out to Pervis Hocking, [place] D. I gave [facility] pharmacy the [facility] number and they are also going to call [facility].

## 2023-05-02 ENCOUNTER — Other Ambulatory Visit: Payer: Self-pay | Admitting: Surgery

## 2023-05-08 LAB — LAB REPORT - SCANNED
A1c: 5.9
EGFR: 59

## 2023-05-16 ENCOUNTER — Other Ambulatory Visit (HOSPITAL_COMMUNITY): Payer: Self-pay

## 2023-05-16 NOTE — Progress Notes (Addendum)
 DVT Clinic Note  Name: Louis Bryan     MRN: [id number]     DOB: 10-14-1935     Sex: male  PCP: Juliane Che, PA  Today's Visit: Visit Information: Follow Up Visit  Referred to DVT Clinic by: Emergency Department - Dr. Dean Referred to CPP by: Dr. Lanis Reason for referral:  Chief Complaint  Patient presents with   Med Management - DVT   HISTORY OF PRESENT ILLNESS: Louis Bryan is a 88 y.o. male with PMH HTN, bilateral PE (2023), T2DM, HLD who presents for follow up medication management after diagnosis of R popliteal DVT on 02/05/23 while adherent to Eliquis . Vascular surgery, Dr. Lanis, recommended switching to Xarelto  and patient was referred to DVT clinic for outpatient follow-up. Last seen in DVT Clinic 04/04/23 at which time patient was doing well on Xarelto . He did report frequent nosebleeds, which were stable for him (uses saline nasal spray and vaseline in nostrils daily). He was seen in the ED on 03/04/23 for nosebleed with some blood coughed up. He was denied from Medicare Extra Help because he was $26 over the annual income limit. He was provided with Xarelto  samples and enrolled in the Xarelto  coverage gap program which reduces cost to $89/mo. Follow-up was scheduled to facilitate continued medication access. His daughter Karna lives with him and helps with his medications.   Today patient reports that he feels well. Patient saw PCP on 05/08/23 without any medication changes. Denies abnormal bleeding or bruising, except he does continue to have almost daily small nosebleeds which he states occur after he blows his nose and quickly resolve. He continues to use Vaseline and saline spray in his nostrils daily. He has not needed to use Afrin to stop a nosebleed. Denies missed doses of Xarelto  - he is taking it with breakfast. He denies swelling, he is not wearing compression stockings. He did receive Xarelto  in the mail ~04/17/23 via Xarelto  coverage gap program - reports that he has 4  pills left.   Positive Thrombotic Risk Factors: Previous VTE, Older age Bleeding Risk Factors: Age >65 years, Anticoagulant therapy  Negative Thrombotic Risk Factors: Recent surgery (within 3 months), Recent trauma (within 3 months), Recent admission to hospital with acute illness (within 3 months), Paralysis, paresis, or recent plaster cast immobilization of lower extremity, Central venous catheterization, Bed rest >72 hours within 3 months, Sedentary journey lasting >8 hours within 4 weeks, Pregnancy, Within 6 weeks postpartum, Recent cesarean section (within 3 months), Estrogen therapy, Testosterone therapy, Erythropoiesis-stimulating agent, Recent COVID diagnosis (within 3 months), Active cancer, Non-malignant, chronic inflammatory condition, Known thrombophilic condition, Smoking, Obesity  Rx Insurance Coverage: Medicare Rx Affordability: Entered the donut hole in 2024, which increased Xarelto  from $47/mo to $169/mo. He was subsequently provided with samples and enrolled in the Xarelto  coverage gap program which reduced the cost to $89/mo. For 2025, expect initial payment of $302 for 76mo supply ($255 deductible + $47 copay), then $47 monthly copay for the remainder of the year.  Preferred Pharmacy: [facility] on [address]   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 Positive for acute pulmonary embolus  with CT evidence of right   Shortness of breath    On exertion   Tobacco abuse     Past Surgical History:  Procedure Laterality Date   CATARACT EXTRACTION Left 07/2016   CHOLECYSTECTOMY  N/A 10/27/2012   Procedure: LAPAROSCOPIC CHOLECYSTECTOMY WITH INTRAOPERATIVE CHOLANGIOGRAM;  Surgeon: Bernarda Ned, MD;  Location: [facility];  Service: General;  Laterality: N/A;   NM MYOVIEW  LTD  12/29/2021   The study is normal. The study is low risk.   No ST deviation was noted => rate related RBBB, peak HR 126 bpm-no chronotropic incompetence.    Left ventricular function is normal. Nuclear stress EF: 61 %. The left ventricular ejection fraction is normal (55-65%). End diastolic cavity size is normal.   Prior study available for comparison from 12/11/2003. No changes compared to prior study.   TRANSTHORACIC ECHOCARDIOGRAM  10/22/2021   Normal LV size and function.  EF 60 to 65%.  GR 1 DD.  Normal RV size and function.  Normal RSVP.  Normal RAP.  Normal MV with mild AOV sclerosis with no stenosis.    Social History   Socioeconomic History   Marital status: Single    Spouse name: divorced   Number of children: 1   Years of education: Not on file   Highest education level: Not on file  Occupational History   Occupation: Retired    Comment: Systems Analyst  Tobacco Use   Smoking status: Former    Current packs/day: 0.00    Types: Cigarettes    Quit date: 06/13/1992    Years since quitting: 30.9   Smokeless tobacco: Never  Vaping Use   Vaping status: Never Used  Substance and Sexual Activity   Alcohol use: No   Drug use: No   Sexual activity: Yes  Other Topics Concern   Not on file  Social History Narrative   He cuts his grass with a push mover in the summer time for exercise. He wants to start back walking.    His daughter lives with him.   One sister lives locally.   Social Drivers of Corporate Investment Banker Strain: Low Risk  (08/02/2022)   Received from [facility]   Overall Financial Resource Strain (CARDIA)    Difficulty of Paying Living Expenses: Not hard at all  Food Insecurity: No Food Insecurity (08/02/2022)   Received [facility]   Hunger Vital Sign    Worried  About Running Out of Food in the Last Year: Never true    Ran Out of Food in the Last Year: Never true  Transportation Needs: No Transportation Needs (08/02/2022)   Received from [facility] - Transportation    Lack of Transportation (Medical): No    Lack of Transportation (Non-Medical): No  Physical Activity: Unknown (08/02/2022)   Received from [facility]   Exercise Vital Sign    Days of Exercise per Week: 0 days    Minutes of Exercise per Session: Not on file  Stress: No Stress Concern Present (08/02/2022)   Received from [facility] - Occupational Stress Questionnaire    Feeling of Stress : Not at all  Social Connections: Socially Integrated (08/02/2022)   [facility]   Social Network    How would you rate your social network (family, work, friends)?: Good participation with social networks  Intimate Partner Violence: Unknown (08/12/2021)   Received from [facility]   HITS  Physically Hurt: Not on file    Insult or Talk Down To: Not on file    Threaten Physical Harm: Not on file    Scream or Curse: Not on file    Family History  Problem Relation Age of Onset   Diabetes Sister    Diabetes Sister     Allergies as of 05/17/2023   (No Known Allergies)    Current Outpatient Medications on File Prior to Encounter  Medication Sig Dispense Refill   allopurinol  (ZYLOPRIM ) 100 MG tablet Take 1 tablet (100 mg total) by mouth daily. 90 tablet 3   losartan -hydrochlorothiazide  (HYZAAR) 50-12.5 MG tablet Take 1 tablet by mouth daily. 90 tablet 3   metoprolol  succinate (TOPROL -XL) 25 MG 24 hr tablet Take 1 tablet (25 mg total) by mouth daily. with food 90 tablet 3   simvastatin  (ZOCOR ) 40 MG tablet Take 40 mg by mouth at bedtime.     sodium chloride  (OCEAN) 0.65 % SOLN nasal spray Place 1 spray into both nostrils as needed. 30 mL 5   Docusate Calcium (STOOL SOFTENER PO) Take 1 capsule by mouth  every evening.     HYDROcodone -acetaminophen  (NORCO/VICODIN) 5-325 MG tablet Take 1 tablet by mouth every 4 (four) hours as needed. 10 tablet 0   oxymetazoline  (AFRIN NASAL SPRAY) 0.05 % nasal spray Place 1 spray into both nostrils 2 (two) times daily. (Patient not taking: Reported on 05/17/2023) 30 mL 0   No current facility-administered medications on file prior to encounter.   REVIEW OF SYSTEMS:  Review of Systems  Respiratory:  Negative for shortness of breath.   Gastrointestinal:  Negative for blood in stool.   Physical Exam Constitutional:      Appearance: Normal appearance. He is normal weight.  Pulmonary:     Effort: Pulmonary effort is normal.  Neurological:     Mental Status: He is alert.  Psychiatric:        Mood and Affect: Mood normal.        Behavior: Behavior normal.        Thought Content: Thought content normal.   Villalta Score for Post-Thrombotic Syndrome: Pain: Absent Cramps: Absent Heaviness: Absent Paresthesia: Absent Pruritus: Absent Pretibial Edema: Absent Skin Induration: Absent Hyperpigmentation: Absent Redness: Absent Venous Ectasia: Absent Pain on calf compression: Absent Villalta Preliminary Score: 0 Is venous ulcer present?: No If venous ulcer is present and score is <15, then 15 points total are assigned: Absent Villalta Total Score: 0  LABS:  CBC     Component Value Date/Time   WBC 8.2 03/04/2023 0935   RBC 4.57 03/04/2023 0935   HGB 14.3 03/04/2023 0939   HGB 15.0 07/11/2017 1045   HCT 42.0 03/04/2023 0939   HCT 45.6 07/11/2017 1045   PLT 191 03/04/2023 0935   PLT 210 07/11/2017 1045   MCV 93.7 03/04/2023 0935   MCV 93 07/11/2017 1045   MCH 30.4 03/04/2023 0935   MCHC 32.5 03/04/2023 0935   RDW 15.1 03/04/2023 0935   RDW 14.7 07/11/2017 1045   LYMPHSABS 1.9 02/05/2023 0834   LYMPHSABS 3.3 (H) 07/11/2017 1045   MONOABS 0.7 02/05/2023 0834   EOSABS 0.1 02/05/2023 0834   EOSABS 0.2 07/11/2017 1045   BASOSABS 0.0 02/05/2023  0834   BASOSABS 0.0 07/11/2017 1045    Hepatic Function      Component Value Date/Time   PROT 6.4 (L) 02/05/2023 0834   PROT 7.5 07/11/2017 1045   ALBUMIN 3.3 (L) 02/05/2023 0834   ALBUMIN 4.3 07/11/2017 1045  AST 15 02/05/2023 0834   ALT 11 02/05/2023 0834   ALKPHOS 72 02/05/2023 0834   BILITOT 0.7 02/05/2023 0834   BILITOT 0.7 07/11/2017 1045    Renal Function   Lab Results  Component Value Date   CREATININE 1.30 (H) 03/04/2023   CREATININE 1.19 02/05/2023   CREATININE 1.34 (H) 04/04/2022    CrCl cannot be calculated (Patient's most recent lab result is older than the maximum 21 days allowed.).   VVS Vascular Lab Studies:  02/05/23 VAS US  LOWER EXTREMITY VENOUS (DVT) RIGHT Summary:   RIGHT:  - Findings consistent with acute deep vein thrombosis involving the right  popliteal vein, and TP Trunk.  - No cystic structure found in the popliteal fossa.    LEFT:  - No evidence of common femoral vein obstruction  ASSESSMENT: Location of DVT: Right popliteal vein Cause of DVT: unprovoked  Patient with history of bilateral PE 10/2021 treated with Eliquis . No clear provoking event at that time. Despite daily adherence to Eliquis , pt developed acute DVT in R popliteral vein and tibioperoneal trunk on 02/05/23 which also appeared to be unprovoked. Patient was switched from Eliquis  to Xarelto , which he is overall tolerating well with the exception of occasional nosebleeds which continue to be stable. Patient is not experiencing any symptoms suggestive of recurrent VTE. Adherence is adequate per patient report and fill history. Indefinite anticoagulation is indicated as long as benefits outweigh risks.   Discussed expected copay of $302/mo for first fill given deductible of $255+ $47 monthly copay, then subsequent copay of $47/mo for the year. Informed patient of alternative payment plan offered by Medicare this year, if he is unable to pay the $302 upfront, where his cost at the pharmacy  would be $0 and he would receive a monthly bill for his medications with the cost distributed over the year. Provided instructions for calling the customer service phone number to enroll in the Medicare payment plan if he decides this will be the best method for him to pay. Unfortunately the patient does not qualify for Medicare Extra Help or Xarelto  patient assistance program, and the Xarelto  coverage gap program was terminated as of 05/09/2023. Patient and daughter express understanding and plan to pay the amount at the pharmacy. No concerns for non-adherence at this time.   PLAN: -Continue rivaroxaban  (Xarelto ) 20 mg daily with food. -Expected duration of therapy: Indefinite. Therapy started on 02/05/23. -Patient educated on purpose, proper use and potential adverse effects of rivaroxaban  (Xarelto ). -Discussed importance of taking medication around the same time every day. -Advised patient of medications to avoid (NSAIDs, aspirin  doses >100 mg daily). -Educated that Tylenol  (acetaminophen ) is the preferred analgesic to lower the risk of bleeding. -Advised patient to alert all providers of anticoagulation therapy prior to starting a new medication or having a procedure. -Emphasized importance of monitoring for signs and symptoms of bleeding (abnormal bruising, prolonged bleeding, nose bleeds, bleeding from gums, discolored urine, black tarry stools). -Educated patient to present to the ED if emergent signs and symptoms of new thrombosis occur. -Patient is discharged from the DVT Clinic. -Patient has been given refills to last 12 months.  -Future refills, prior authorizations, and applications for patient assistance programs will be the responsibility of the patient's primary care provider, who has been notified of this.  -Patient encouraged to call DVT clinic with any questions or concerns  Lorain Baseman, PharmD PGY1 Pharmacy Resident  Lum Herald, PharmD, BCACP, CPP Deep Vein Thrombosis  Clinic Clinical Pharmacist Practitioner Office: [phone]  I have evaluated the patient's chart/imaging and refer this patient to the Clinical Pharmacist Practitioner for medication management. I have reviewed the CPP's documentation and agree with her assessment and plan. I was immediately available during the visit for questions and collaboration.   Fonda FORBES Rim, MD

## 2023-05-17 ENCOUNTER — Ambulatory Visit (HOSPITAL_COMMUNITY)
Admission: RE | Admit: 2023-05-17 | Discharge: 2023-05-17 | Disposition: A | Discharge status: Home / Self Care | Payer: Medicare Other | Source: Ambulatory Visit | Attending: Vascular Surgery | Admitting: Vascular Surgery

## 2023-05-17 DIAGNOSIS — I82431 Acute embolism and thrombosis of right popliteal vein: Secondary | ICD-10-CM | POA: Diagnosis present

## 2023-05-17 MED ORDER — RIVAROXABAN 20 MG PO TABS: 20.0000 mg | ORAL_TABLET | Freq: Every day | ORAL | 11 refills | Status: DC

## 2023-05-17 NOTE — Patient Instructions (Signed)
-   Continue rivaroxaban  (Xarelto ) 20 mg by mouth daily - We sent in one year of refills today. Your primary care provider can provide additional refills after this. Please reach out if you have any questions or concerns, or need assistance obtaining your medication in the future.  -Your refills have been sent to [facility] on [address]. You may need to call the pharmacy to ask them to fill this when you start to run low on your current supply.  -It is important to take your medication around the same time every day.  -Avoid NSAIDs like ibuprofen (Advil, Motrin) and naproxen (Aleve) as well as aspirin  doses over 100 mg daily. -Tylenol  (acetaminophen ) is the preferred over the counter pain medication to lower the risk of bleeding. -Be sure to alert all of your health care providers that you are taking an anticoagulant prior to starting a new medication or having a procedure. -Monitor for signs and symptoms of bleeding (abnormal bruising, prolonged bleeding, nose bleeds, bleeding from gums, discolored urine, black tarry stools). If you have fallen and hit your head OR if your bleeding is severe or not stopping, seek emergency care.  -Go to the emergency room if emergent signs and symptoms of new clot occur (new or worse swelling and pain in an arm or leg, shortness of breath, chest pain, fast or irregular heartbeats, lightheadedness, dizziness, fainting, coughing up blood) or if you experience a significant color change (pale or blue) in the extremity that has the DVT.   - We expect your initial cost to be higher due to the $255 deductible plus the $47 monthly copay, after this, expect to may $47 per month for Xarelto . If you are interested in enrolling in the medicare payment plan, call the Customer Service phone number on your insurance card.    If you have any questions or need to reschedule an appointment, please call [phone] [facility]. We are moving locations in April 2025 - after this date  please find our updated phone number on [email or web address] If you are having an emergency, call 911 or present to the nearest emergency room.   What is a DVT?  -Deep vein thrombosis (DVT) is a condition in which a blood clot forms in a vein of the deep venous system which can occur in the lower leg, thigh, pelvis, arm, or neck. This condition is serious and can be life-threatening if the clot travels to the arteries of the lungs and causing a blockage (pulmonary embolism, PE). A DVT can also damage veins in the leg, which can lead to long-term venous disease, leg pain, swelling, discoloration, and ulcers or sores (post-thrombotic syndrome).  -Treatment may include taking an anticoagulant medication to prevent more clots from forming and the current clot from growing, wearing compression stockings, and/or surgical procedures to remove or dissolve the clot.

## 2023-06-09 ENCOUNTER — Ambulatory Visit: Payer: Medicare Other | Attending: Cardiology | Admitting: Cardiology

## 2023-06-09 VITALS — BP 106/64 | HR 93 | Ht 66.0 in | Wt 179.0 lb

## 2023-06-09 DIAGNOSIS — I10 Essential (primary) hypertension: Secondary | ICD-10-CM

## 2023-06-09 DIAGNOSIS — E782 Mixed hyperlipidemia: Secondary | ICD-10-CM

## 2023-06-09 DIAGNOSIS — I4589 Other specified conduction disorders: Secondary | ICD-10-CM

## 2023-06-09 DIAGNOSIS — I8290 Acute embolism and thrombosis of unspecified vein: Secondary | ICD-10-CM | POA: Diagnosis not present

## 2023-06-09 DIAGNOSIS — Z7901 Long term (current) use of anticoagulants: Secondary | ICD-10-CM

## 2023-06-09 DIAGNOSIS — I2609 Other pulmonary embolism with acute cor pulmonale: Secondary | ICD-10-CM | POA: Diagnosis not present

## 2023-06-09 DIAGNOSIS — Z0000 Encounter for general adult medical examination without abnormal findings: Secondary | ICD-10-CM

## 2023-06-09 NOTE — Progress Notes (Unsigned)
Cardiology Office Note:  .   Date:  06/12/2023  ID:  Louis Bryan, DOB Oct 18, 1935, MRN [id number] PCP: Porfirio Oar, PA  [facility] Providers Cardiologist:  Bryan Lemma, MD 2    Chief Complaint  Patient presents with   Follow-up    History of DVT had PE now with recurrent DVT on Eliquis.  Converted to Xarelto.    Patient Profile: .     Louis Bryan is a  88 y.o. male with a PMH notable for HTN, HLD, OSA on CPAP, emphysema and DVT-PE who presents here for 25-month follow-up at the request of Porfirio Oar, [place].  Recent DVT - had been followed @ [facility] - [facility] - Pharmacist   Louis Bryan was last seen 14 January 2022 to discuss results of his Myoview stress test and echocardiogram ordered for evaluation of exertional dyspnea and chest tightness shortly after submassive PE.  He was doing well.  Maybe noting getting a little more tired than usual and short of breath to be exerting himself, but not routine activity.  No heart failure symptoms and no arrhythmias.  Was not complaining of symptoms concerning for chronotropic competence after we had reduced his beta-blocker dose in 2017 to 25 mg Toprol.  He was able to get his heart rate up to 126 bpm with treadmill for stress test.  Precordial pain was likely felt to be related to his relatively recent PE and then was also noticing some more symptoms consistent with muscle fasciculations.  Suggestion was for at least a year management with DOAC.  ER visits April 04, 2022 as well as July 22, August 5 and 24, 2024 for epistaxis => recommended nasal sprays.  On 1 occasion had silver nitrate cautery done on the left side.  Referred to ENT Seen by ENT on 01/12/2023 -> recommended: nasal saline spray 6 times a day and Vaseline twice a day.   For breakthrough active bleeds use Afrin and nasal tip pressure If this does not stop bleeding, go to ER. Monitor BP levels. If recurrent bleeding occurs, can consider cauterization. ER visit 02/05/2023:  Came in with low back pain radiating to the left right leg.--Acute DVT of the right peroneal and posterior tibial veins. ??  DVT while on Eliquis-converted to Xarelto based on vascular surgical recommendations. => Initiation was delayed because of difficulty getting starter pack => 50 mg twice daily for 21 days followed by 20 mg daily.  Recommended holding NSAIDs. Seen for several follow-up visits (most recently on January 8) in the [facility] by the Clinical Pharmacist team. [facility] on 03/04/2023 for epistaxis.  Subjective  Discussed the use of AI scribe software for clinical note transcription with the patient, who gave verbal consent to proceed.  History of Present Illness   The patient, with a history of pulmonary embolism and deep vein thrombosis managed with DOAC, complicated by epistaxis.   He is accompanied by a caregiver.  The patient has a history of blood clots, including a pulmonary embolism, and has been on anticoagulation therapy since September 2023. Initially prescribed Eliquis, he was switched to Xarelto due to cost issues. He finds Xarelto expensive, costing approximately $140, and has been receiving samples from a pharmacist to manage the expense. He has been on blood thinners continuously since the discovery of a clot in his leg, which subsequently traveled to his lung.  He experiences recurrent epistaxis, primarily when blowing his nose or bending over. He has been using Afrin spray and  saline spray to manage the symptoms, along with applying Vaseline at night. Despite these measures, the epistaxis persists intermittently, with periods of no bleeding followed by recurrence.  No symptoms of shortness of breath, chest pain, heart palpitations, or dizziness. He has not experienced any swelling in his legs, except when crossing them for extended periods. He is able to walk without difficulty and has been more active in the past few days.  He is on medication for  cholesterol, blood pressure, and gout. He is no longer on medication for diabetes, as his blood sugar levels are controlled. He has not experienced any stroke-like symptoms, such as weakness, slurred speech, or drooling. He also denies any blood in his stools or urine, although he occasionally coughs up a small amount of blood, likely from his nose.     Cardiovascular ROS: positive for - stable exertional dyspnea, and minimal l leg swelling, made worse by crossing his legs extended periods of time.  Most notably is intermittent epistaxis as reviewed above. negative for - chest pain, irregular heartbeat, orthopnea, palpitations, paroxysmal nocturnal dyspnea, rapid heart rate, shortness of breath, or syncope or near syncope, TIA or RCVS, claudication   Objective    Studies Reviewed: Marland Kitchen   EKG Interpretation Date/Time:  Friday June 09 2023 10:02:01 EST Ventricular Rate:  97 PR Interval:  160 QRS Duration:  86 QT Interval:  364 QTC Calculation: 462 R Axis:   -19  Text Interpretation: Sinus rhythm with occasional Premature ventricular complexes When compared with ECG of 21-Oct-2021 07:14, Premature ventricular complexes are now Present Otherwise no significant change Confirmed by Bryan Lemma ([id number]) on 06/09/2023 10:35:37 AM     New Study Lower extremity Dopplers 02/06/2023: Right popliteal vein and TP trunk DVT  Previous Studies  L EV Dopplers 10/22/2021: Bilateral LE Dopplers: No DVT PE protocol CTA 10/21/2021: Positive for acute PE with evidence of right heart strain C/W intermediate risk submassive PE.  Also noted aortic atherosclerosis and emphysema. Echocardiogram 10/22/2021: Normal LV size and function.  EF 60 to 65%.  GR 1 DD.  Normal RV size and function.  Normal RSVP.  Normal RAP.  Normal MV with mild AOV sclerosis with no stenosis. MYOVIEW 12/29/21: The study is normal. The study is low risk.   No ST deviation was noted.   Left ventricular function is normal. Nuclear stress EF: 61 %.  The left ventricular ejection fraction is normal (55-65%). End diastolic cavity size is normal.   Prior study available for comparison from 12/11/2003. No changes compared to prior study.   [facility] Related to Lipid Panel With LDL/HDL Ratio Component 05/08/23 02/03/23 11/02/22 08/02/22  Cholesterol, Total 133 138 149 134  Triglycerides 106 108 92 122  HDL 54 53 62 53  VLDL Cholesterol Cal 19 20 17 22   LDL 60 65 70 59  LDL/HDL Ratio 1.1  1.2  1.1  1.1    Component 05/08/23 02/03/23 11/02/22  Hemoglobin A1c 5.9 Abnormal  6.0 Abnormal  6.0 Abnormal    Component 05/08/23 02/03/23 11/02/22  Glucose 104 High  86 106 High   BUN 16 15 24   Creatinine 1.21 1.07 1.32 High   eGFR 59 Low  68 53 Low   BUN/Creatinine Ratio 13 14 18   Sodium 142 142 147 High   Potassium 4.0 3.9 4.1  Chloride 106 105 107 High   CO2 23 22 23   CALCIUM 9.5 8.9 9.4  Total Protein 6.8 6.9 7.1  Albumin, Serum 4.2 4.0 4.3  Globulin, Total  2.6 2.9 2.8  Total Bilirubin 0.5 0.8 0.7  Alkaline Phosphatase 104 95 102  AST 10 12 14   ALT (SGPT) 8 8 9     Risk Assessment/Calculations:         Physical Exam:   VS:  BP 106/64 (BP Location: Right Arm, Patient Position: Sitting, Cuff Size: Normal)   Pulse 93   Ht 5\' 6"  (1.676 m)   Wt 179 lb (81.2 kg)   SpO2 94%   BMI 28.89 kg/m    Wt Readings from Last 3 Encounters:  06/09/23 179 lb (81.2 kg)  02/05/23 180 lb (81.6 kg)  01/12/23 180 lb 14.4 oz (82.1 kg)    GEN: Well nourished, well groomed in no acute distress; mild memory loss but otherwise healthy-appearing.  Looks younger than stated age. NECK: No JVD; No carotid bruits CARDIAC: Normal S1, S2; RRR, no murmurs, rubs, gallops RESPIRATORY:  Clear to auscultation without rales, wheezing or rhonchi ; nonlabored, good air movement. ABDOMEN: Soft, non-tender, non-distended EXTREMITIES:  No edema; No deformity; mildly diminished pedal pulses and somewhat unsteady gait.     ASSESSMENT AND PLAN: .    Problem  List Items Addressed This Visit       Cardiology Problems   Essential hypertension - Primary (Chronic)   Well-controlled blood pressure today,-Eniola below on standing dose of Toprol 25 mg daily and losartan/HCTZ 50-12.5 mg daily. -Continue current meds      Relevant Orders   EKG 12-Lead (Completed)   Hyperlipidemia (Chronic)   Remains on 40 mg simvastatin with excellent lipid control reviewed above. -Continue simvastatin at current dose.      Pulmonary embolism (HCC) (Chronic)   Over a year out from his PE and summer 2023 when he then developed a DVT ostensibly while on Eliquis. Thankfully, this DVT did not have recurrent PE.  Lifelong DOAC but monitoring and treating nosebleeds.      Recurrent DVT-PE (Chronic)   History of PE back in 2023 treated initially with Eliquis, complicated by epistaxis. By report, despite continuing Eliquis, he had a left lower extremity DVT in September 2024, converted to Xarelto. I suspect that there is some underlying hypercoagulable state, made worse by the fact that he likes to cross his legs.  Recurrent Venous Thromboembolism On Xarelto for recurrent DVT/PE. No new symptoms of DVT or PE. No significant bleeding complications, except for nosebleeds likely secondary to nasal dryness. -Continue Xarelto as prescribed by primary care physician - will defer management to PCP as this does not require a Cardiologist.  -Use saline nasal saline, PRN Afrin spray and Vaseline to manage nosebleeds.        Other   Cardiovascular health maintenance   No symptoms of chest pain, shortness of breath, palpitations, or syncope.  Blood pressure, cholesterol, and blood sugars are well controlled.  No current need for cardiology follow-up. -Graduate from cardiology follow-up. Primary care physician to manage cardiovascular health and anticoagulation. -Continue current medications as prescribed by primary care physician. -Encouraged to maintain physical  activity. -Return to cardiology if new symptoms develop.       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      Current use of long term anticoagulation (Chronic)   Submassive PE in 2023 now with recurrent DVT supposedly on Eliquis.  Subsequently converted to Xarelto including loading dosing.  This is accompanied by frequent epistaxis => recommendations for management provided by ENT.  For urgent procedures or surgeries, would  probably be okay to hold DOAC, will defer to PCP going forward.        Follow-Up: Return if symptoms worsen or fail to improve.      Signed, [name], MD, MS Bryan Lemma, M.D., M.S. Interventional Cardiologist  [facility]  Pager # [phone] Phone # [phone] [address] [place], [place] [address]

## 2023-06-09 NOTE — Patient Instructions (Signed)
 Medication Instructions:  No changes   *If you need a refill on your cardiac medications before your next appointment, please call your pharmacy*   Lab Work: Not needed   Testing/Procedures:  Not needed  Follow-Up: At [facility], you and your health needs are our priority.  As part of our continuing mission to provide you with exceptional heart care, we have created designated Provider Care Teams.  These Care Teams include your primary Cardiologist (physician) and Advanced Practice Providers (APPs -  Physician Assistants and Nurse Practitioners) who all work together to provide you with the care you need, when you need it.     Your next appointment:   As needed   The format for your next appointment:   In Person  Provider:   Bryan Lemma, MD

## 2023-06-12 ENCOUNTER — Encounter: Payer: Self-pay | Admitting: Cardiology

## 2023-06-12 DIAGNOSIS — Z7901 Long term (current) use of anticoagulants: Secondary | ICD-10-CM | POA: Insufficient documentation

## 2023-06-12 DIAGNOSIS — Z0000 Encounter for general adult medical examination without abnormal findings: Secondary | ICD-10-CM | POA: Insufficient documentation

## 2023-06-12 NOTE — Assessment & Plan Note (Signed)
No symptoms of chest pain, shortness of breath, palpitations, or syncope.  Blood pressure, cholesterol, and blood sugars are well controlled.  No current need for cardiology follow-up. -Graduate from cardiology follow-up. Primary care physician to manage cardiovascular health and anticoagulation. -Continue current medications as prescribed by primary care physician. -Encouraged to maintain physical activity. -Return to cardiology if new symptoms develop.

## 2023-06-12 NOTE — Assessment & Plan Note (Signed)
Well-controlled blood pressure today,-Eniola below on standing dose of Toprol 25 mg daily and losartan/HCTZ 50-12.5 mg daily. -Continue current meds

## 2023-06-12 NOTE — Assessment & Plan Note (Addendum)
History of PE back in 2023 treated initially with Eliquis, complicated by epistaxis. By report, despite continuing Eliquis, he had a left lower extremity DVT in September 2024, converted to Xarelto. I suspect that there is some underlying hypercoagulable state, made worse by the fact that he likes to cross his legs.  Recurrent Venous Thromboembolism On Xarelto for recurrent DVT/PE. No new symptoms of DVT or PE. No significant bleeding complications, except for nosebleeds likely secondary to nasal dryness. -Continue Xarelto as prescribed by primary care physician - will defer management to PCP as this does not require a Cardiologist.  -Use saline nasal saline, PRN Afrin spray and Vaseline to manage nosebleeds.

## 2023-06-12 NOTE — Assessment & Plan Note (Signed)
Submassive PE in 2023 now with recurrent DVT supposedly on Eliquis.  Subsequently converted to Xarelto including loading dosing.  This is accompanied by frequent epistaxis => recommendations for management provided by ENT.  For urgent procedures or surgeries, would probably be okay to hold DOAC, will defer to PCP going forward.

## 2023-06-12 NOTE — Assessment & Plan Note (Signed)
Remains on 40 mg simvastatin with excellent lipid control reviewed above. -Continue simvastatin at current dose.

## 2023-06-12 NOTE — Assessment & Plan Note (Signed)
Notable improvement with reduction of his Toprol dose.

## 2023-06-12 NOTE — Assessment & Plan Note (Signed)
Over a year out from his PE and summer 2023 when he then developed a DVT ostensibly while on Eliquis. Thankfully, this DVT did not have recurrent PE.  Lifelong DOAC but monitoring and treating nosebleeds.

## 2023-07-22 ENCOUNTER — Encounter (HOSPITAL_COMMUNITY): Payer: Self-pay | Admitting: *Deleted

## 2023-07-22 ENCOUNTER — Emergency Department (HOSPITAL_COMMUNITY)
Admission: EM | Admit: 2023-07-22 | Discharge: 2023-07-22 | Disposition: A | Discharge status: Home / Self Care | Attending: Emergency Medicine | Admitting: Emergency Medicine

## 2023-07-22 ENCOUNTER — Emergency Department (HOSPITAL_COMMUNITY)

## 2023-07-22 ENCOUNTER — Other Ambulatory Visit: Payer: Self-pay

## 2023-07-22 DIAGNOSIS — R519 Headache, unspecified: Secondary | ICD-10-CM | POA: Diagnosis present

## 2023-07-22 DIAGNOSIS — Z7901 Long term (current) use of anticoagulants: Secondary | ICD-10-CM | POA: Insufficient documentation

## 2023-07-22 NOTE — ED Triage Notes (Signed)
 C/o headache onset yest c/o nosebleed several days c/I heaviness in his head , denies blurred or double vision denies n/v denies injury

## 2023-07-22 NOTE — ED Provider Notes (Signed)
 [place] EMERGENCY DEPARTMENT AT [facility] Provider Note   CSN: [id number] Arrival date & time: 07/22/23  [id number]     History  Chief Complaint  Patient presents with   Headache    Louis Bryan is a 88 y.o. male.  88 year old male with prior medical history as detailed below presents for evaluation.  Patient complains of headache.  He complains of pain to the "top of my head".  This has been persistent since yesterday.  He took some Tylenol with significant improvement in his symptoms.  He is concerned, he has a history of being on anticoagulation, and he is requesting a head CT.  He is ambulatory without difficulty.  He denies visual change, focal weakness, unsteady gait, nausea, vomiting, fever, neck pain, chest pain, shortness of breath, other complaint.  His last dose of Tylenol was around 6 AM and he reports improvement in his symptoms since.  The history is provided by the patient and medical records.       Home Medications Prior to Admission medications   Medication Sig Start Date End Date Taking? Authorizing Provider  allopurinol (ZYLOPRIM) 100 MG tablet Take 1 tablet (100 mg total) by mouth daily. 07/11/17   Porfirio Oar, PA  Docusate Calcium (STOOL SOFTENER PO) Take 1 capsule by mouth every evening.    [provider]  HYDROcodone-acetaminophen (NORCO/VICODIN) 5-325 MG tablet Take 1 tablet by mouth every 4 (four) hours as needed. 02/05/23   Jacalyn Lefevre, MD  losartan-hydrochlorothiazide (HYZAAR) 50-12.5 MG tablet Take 1 tablet by mouth daily. 08/28/17   Porfirio Oar, PA  metoprolol succinate (TOPROL-XL) 25 MG 24 hr tablet Take 1 tablet (25 mg total) by mouth daily. with food 08/28/17   Porfirio Oar, PA  oxymetazoline (AFRIN NASAL SPRAY) 0.05 % nasal spray Place 1 spray into both nostrils 2 (two) times daily. 01/12/23   Ashok Croon, MD  rivaroxaban (XARELTO) 20 MG TABS tablet Take 1 tablet (20 mg total) by mouth daily with supper. Take with food.  Future refills to come from PCP. 05/17/23   Pervis Hocking B, RPH-CPP  simvastatin (ZOCOR) 40 MG tablet Take 40 mg by mouth at bedtime. 09/20/21   [provider]  sodium chloride (OCEAN) 0.65 % SOLN nasal spray Place 1 spray into both nostrils as needed. 01/12/23   Ashok Croon, MD      Allergies    Patient has no known allergies.    Review of Systems   Review of Systems  All other systems reviewed and are negative.   Physical Exam Updated Vital Signs BP 133/84 (BP Location: Right Arm)   Pulse 73   Temp 98 F (36.7 C) (Oral)   Resp 18   Ht 5\' 5"  (1.651 m)   Wt 79.4 kg   SpO2 98%   BMI 29.12 kg/m  Physical Exam Vitals and nursing note reviewed.  Constitutional:      General: He is not in acute distress.    Appearance: Normal appearance. He is well-developed.  HENT:     Head: Normocephalic and atraumatic.  Eyes:     General: No visual field deficit.    Conjunctiva/sclera: Conjunctivae normal.     Pupils: Pupils are equal, round, and reactive to light.  Cardiovascular:     Rate and Rhythm: Normal rate and regular rhythm.     Heart sounds: Normal heart sounds.  Pulmonary:     Effort: Pulmonary effort is normal. No respiratory distress.     Breath sounds: Normal breath sounds.  Abdominal:     General: There is no distension.     Palpations: Abdomen is soft.     Tenderness: There is no abdominal tenderness.  Musculoskeletal:        General: No deformity. Normal range of motion.     Cervical back: Normal range of motion and neck supple.  Skin:    General: Skin is warm and dry.  Neurological:     General: No focal deficit present.     Mental Status: He is alert and oriented to person, place, and time.     GCS: GCS eye subscore is 4. GCS verbal subscore is 5. GCS motor subscore is 6.     Cranial Nerves: No cranial nerve deficit, dysarthria or facial asymmetry.     Sensory: No sensory deficit.     Motor: No weakness.     Coordination: Romberg sign negative.      ED Results / Procedures / Treatments   Labs (all labs ordered are listed, but only abnormal results are displayed) Labs Reviewed - No data to display  EKG None  Radiology No results found.  Procedures Procedures    Medications Ordered in ED Medications - No data to display  ED Course/ Medical Decision Making/ A&P                                 Medical Decision Making Amount and/or Complexity of Data Reviewed Radiology: ordered.    Medical Screen Complete  This patient presented to the ED with complaint of headache, resolved.  This complaint involves an extensive number of treatment options. The initial differential diagnosis includes, but is not limited to, intracranial pathology such as hemorrhage, mass, tension headache, etc.  This presentation is: Acute, Self-Limited, Previously Undiagnosed, and Uncertain Prognosis  Patient presents with complaint of headache.  Patient reports that he took Tylenol with improvement in symptoms.  However, patient is on anticoagulation and he is concerned that his headache could signify something more significant than just a simple tension headache.  At the time of my evaluation he feels much improved.  He denies any significant headache now.  He is neurologically intact.  CT imaging obtained is without acute abnormality.  Patient appears reassured by evaluation and imaging result.  He is appropriate for discharge.  Importance close follow-up is stressed.  Strict return precautions given and understood.  Co morbidities that complicated the patient's evaluation  See HPI   Additional history obtained: External records from outside sources obtained and reviewed including prior ED visits and prior Inpatient records.   Imaging Studies ordered:  I ordered imaging studies including CT head I independently visualized and interpreted obtained imaging which showed NAD I agree with the radiologist interpretation.   Problem  List / ED Course:  Headache, resolved   Reevaluation:  After the interventions noted above, I reevaluated the patient and found that they have: resolved    Disposition:  After consideration of the diagnostic results and the patients response to treatment, I feel that the patent would benefit from close outpatient follow-up.           Final Clinical Impression(s) / ED Diagnoses Final diagnoses:  Acute nonintractable headache, unspecified headache type    Rx / DC Orders ED Discharge Orders     None         Wynetta Fines, MD 07/22/23 1141

## 2023-07-22 NOTE — Discharge Instructions (Signed)
 Return for any problem.  ?

## 2023-12-11 ENCOUNTER — Emergency Department (HOSPITAL_COMMUNITY)
Admission: EM | Admit: 2023-12-11 | Discharge: 2023-12-11 | Disposition: A | Discharge status: Home / Self Care | Attending: Emergency Medicine | Admitting: Emergency Medicine

## 2023-12-11 ENCOUNTER — Other Ambulatory Visit: Payer: Self-pay

## 2023-12-11 ENCOUNTER — Encounter (HOSPITAL_COMMUNITY): Payer: Self-pay

## 2023-12-11 DIAGNOSIS — Z7901 Long term (current) use of anticoagulants: Secondary | ICD-10-CM | POA: Insufficient documentation

## 2023-12-11 DIAGNOSIS — I10 Essential (primary) hypertension: Secondary | ICD-10-CM | POA: Diagnosis not present

## 2023-12-11 DIAGNOSIS — R519 Headache, unspecified: Secondary | ICD-10-CM | POA: Diagnosis present

## 2023-12-11 LAB — CBC
HCT: 42.8 % (ref 39.0–52.0)
Hemoglobin: 13.9 g/dL (ref 13.0–17.0)
MCH: 31.1 pg (ref 26.0–34.0)
MCHC: 32.5 g/dL (ref 30.0–36.0)
MCV: 95.7 fL (ref 80.0–100.0)
Platelets: 189 K/uL (ref 150–400)
RBC: 4.47 MIL/uL (ref 4.22–5.81)
RDW: 14.4 % (ref 11.5–15.5)
WBC: 6.2 K/uL (ref 4.0–10.5)
nRBC: 0 % (ref 0.0–0.2)

## 2023-12-11 LAB — BASIC METABOLIC PANEL WITH GFR
Anion gap: 8 (ref 5–15)
BUN: 16 mg/dL (ref 8–23)
CO2: 22 mmol/L (ref 22–32)
Calcium: 8.9 mg/dL (ref 8.9–10.3)
Chloride: 107 mmol/L (ref 98–111)
Creatinine, Ser: 1.25 mg/dL — ABNORMAL HIGH (ref 0.61–1.24)
GFR, Estimated: 56 mL/min — ABNORMAL LOW (ref >60)
Glucose, Bld: 106 mg/dL — ABNORMAL HIGH (ref 70–99)
Potassium: 3.9 mmol/L (ref 3.5–5.1)
Sodium: 137 mmol/L (ref 135–145)

## 2023-12-11 LAB — SEDIMENTATION RATE: Sed Rate: 15 mm/h (ref 0–16)

## 2023-12-11 MED ORDER — METHOCARBAMOL 500 MG PO TABS: 250.0000 mg | ORAL_TABLET | Freq: Three times a day (TID) | ORAL | 0 refills | Status: AC | PRN

## 2023-12-11 NOTE — ED Triage Notes (Signed)
 It c/o intermittent HA, worsening last night; taking tylenol  at home with some relief; denies neuro deficits; ambulatory with steady gait

## 2023-12-11 NOTE — ED Provider Notes (Signed)
 [place] EMERGENCY DEPARTMENT AT [facility] Provider Note   CSN: [id number] Arrival date & time: 12/11/23  9268     Patient presents with: Headache   Louis Bryan is a 88 y.o. male.    Headache Patient with headaches.  It has been intermittent.  Comes and goes.  Worse last night.  Right side of the head.  Feels it comes from the back of the head up to the temporal area.  No vision changes.  No numbness or weakness.  States however now the headache has gone away.  He was a little more worried because he is on anticoagulation.  Did have similar visit to the ER around 5 months ago with negative CT scan at that time.  No trauma.    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 Positive for acute pulmonary embolus with CT evidence of right   Shortness of breath    On exertion   Tobacco abuse     Prior to Admission medications   Medication Sig Start Date End Date Taking? Authorizing Provider  methocarbamol  (ROBAXIN ) 500 MG tablet Take 0.5 tablets (250 mg total) by mouth every 8 (eight) hours as needed. 12/11/23  Yes Patsey Lot, MD  allopurinol  (ZYLOPRIM ) 100 MG tablet Take 1 tablet (100 mg total) by mouth daily. 07/11/17   Juliane Che, PA  Docusate Calcium (STOOL SOFTENER PO) Take 1 capsule by mouth every evening.    [provider]  HYDROcodone -acetaminophen  (NORCO/VICODIN) 5-325 MG tablet Take 1 tablet by mouth every 4 (four) hours as needed. 02/05/23   Dean Clarity, MD  losartan -hydrochlorothiazide  (HYZAAR) 50-12.5 MG tablet Take 1 tablet by mouth daily.  08/28/17   Juliane Che, PA  metoprolol  succinate (TOPROL -XL) 25 MG 24 hr tablet Take 1 tablet (25 mg total) by mouth daily. with food 08/28/17   Juliane Che, PA  oxymetazoline  (AFRIN NASAL SPRAY) 0.05 % nasal spray Place 1 spray into both nostrils 2 (two) times daily. 01/12/23   Soldatova, Liuba, MD  rivaroxaban  (XARELTO ) 20 MG TABS tablet Take 1 tablet (20 mg total) by mouth daily with supper. Take with food. Future refills to come from PCP. 05/17/23   Barbarann Dixon B, RPH-CPP  simvastatin  (ZOCOR ) 40 MG tablet Take 40 mg by mouth at bedtime. 09/20/21   [provider]  sodium chloride  (OCEAN) 0.65 % SOLN nasal spray Place 1 spray into both nostrils as needed. 01/12/23   Soldatova, Liuba, MD    Allergies: Patient has no known allergies.    Review of Systems  Neurological:  Positive for headaches.    Updated Vital Signs BP 119/88   Pulse 71   Temp 98 F (36.7 C) (Oral)   Resp 16   Ht 5' 5 (1.651 m)   Wt 78.9 kg   SpO2 100%   BMI 28.96 kg/m   Physical Exam Vitals and nursing note reviewed.  HENT:     Head: Atraumatic.     Comments: No tenderness to right temple. Neck:     Comments: No midline tenderness. Cardiovascular:     Rate and Rhythm: Normal rate.  Musculoskeletal:  Cervical back: Normal range of motion and neck supple.  Neurological:     Mental Status: He is alert.     (all labs ordered are listed, but only abnormal results are displayed) Labs Reviewed  BASIC METABOLIC PANEL WITH GFR - Abnormal; Notable for the following components:      Result Value   Glucose, Bld 106 (*)    Creatinine, Ser 1.25 (*)    GFR, Estimated 56 (*)    All other components within normal limits  CBC  SEDIMENTATION RATE    EKG: EKG Interpretation Date/Time:  Monday December 11 2023 07:40:11 EDT Ventricular Rate:  78 PR Interval:  178 QRS Duration:  144 QT Interval:  440 QTC Calculation: 482 R Axis:   21  Text Interpretation: Sinus rhythm Ventricular premature  complex Right bundle branch block rbbb is new Confirmed by Patsey Lot [phone]) on 12/11/2023 9:04:32 AM  Radiology: No results found.   Procedures   Medications Ordered in the ED - No data to display                                  Medical Decision Making Amount and/or Complexity of Data Reviewed Labs: ordered.  Risk Prescription drug management.   Patient with right-sided headache.  Resolved now.  Has taken Tylenol  at home which is improved.  However has been more frequent.  Reviewed CT scan from a few months ago and reassuring.  However with temporal headache in an elderly person will get a sed rate.  Differential diagnose includes causes such as nonspecific headache, intracranial hemorrhage, giant cell arteritis  Blood work reassuring.  Normal sed rate.  With tenderness to occipital area worse with movement may be a component of muscle irritation.  Will have small dose of muscle relaxer and follow-up with PCP.  Appears stable for discharge.     Final diagnoses:  Bad headache    ED Discharge Orders          Ordered    methocarbamol  (ROBAXIN ) 500 MG tablet  Every 8 hours PRN        12/11/23 0946               Patsey Lot, MD 12/11/23 1536

## 2023-12-11 NOTE — Discharge Instructions (Addendum)
 Your workup was assuring today.  Follow-up with your doctor.  We can try a very small dose of a muscle relaxer to see if that will help since it seems to come from your neck.

## 2024-01-13 ENCOUNTER — Other Ambulatory Visit: Payer: Self-pay

## 2024-01-13 ENCOUNTER — Encounter (HOSPITAL_COMMUNITY): Payer: Self-pay

## 2024-01-13 ENCOUNTER — Emergency Department (HOSPITAL_COMMUNITY)
Admission: EM | Admit: 2024-01-13 | Discharge: 2024-01-13 | Disposition: A | Discharge status: Home / Self Care | Attending: Emergency Medicine | Admitting: Emergency Medicine

## 2024-01-13 DIAGNOSIS — R102 Pelvic and perineal pain: Secondary | ICD-10-CM | POA: Diagnosis present

## 2024-01-13 DIAGNOSIS — K4090 Unilateral inguinal hernia, without obstruction or gangrene, not specified as recurrent: Secondary | ICD-10-CM | POA: Insufficient documentation

## 2024-01-13 DIAGNOSIS — Z720 Tobacco use: Secondary | ICD-10-CM | POA: Diagnosis not present

## 2024-01-13 DIAGNOSIS — Z7901 Long term (current) use of anticoagulants: Secondary | ICD-10-CM | POA: Insufficient documentation

## 2024-01-13 DIAGNOSIS — I10 Essential (primary) hypertension: Secondary | ICD-10-CM | POA: Diagnosis not present

## 2024-01-13 LAB — CBC
HCT: 44.7 % (ref 39.0–52.0)
Hemoglobin: 14.8 g/dL (ref 13.0–17.0)
MCH: 31 pg (ref 26.0–34.0)
MCHC: 33.1 g/dL (ref 30.0–36.0)
MCV: 93.5 fL (ref 80.0–100.0)
Platelets: 210 K/uL (ref 150–400)
RBC: 4.78 MIL/uL (ref 4.22–5.81)
RDW: 14.6 % (ref 11.5–15.5)
WBC: 10.9 K/uL — ABNORMAL HIGH (ref 4.0–10.5)
nRBC: 0 % (ref 0.0–0.2)

## 2024-01-13 LAB — COMPREHENSIVE METABOLIC PANEL WITH GFR
ALT: 11 U/L (ref 0–44)
AST: 17 U/L (ref 15–41)
Albumin: 3.5 g/dL (ref 3.5–5.0)
Alkaline Phosphatase: 88 U/L (ref 38–126)
Anion gap: 17 — ABNORMAL HIGH (ref 5–15)
BUN: 13 mg/dL (ref 8–23)
CO2: 19 mmol/L — ABNORMAL LOW (ref 22–32)
Calcium: 9 mg/dL (ref 8.9–10.3)
Chloride: 104 mmol/L (ref 98–111)
Creatinine, Ser: 1.17 mg/dL (ref 0.61–1.24)
GFR, Estimated: >60 mL/min (ref >60)
Glucose, Bld: 119 mg/dL — ABNORMAL HIGH (ref 70–99)
Potassium: 4.1 mmol/L (ref 3.5–5.1)
Sodium: 140 mmol/L (ref 135–145)
Total Bilirubin: 1 mg/dL (ref 0.0–1.2)
Total Protein: 7 g/dL (ref 6.5–8.1)

## 2024-01-13 LAB — I-STAT CHEM 8, ED
BUN: 15 mg/dL (ref 8–23)
Calcium, Ion: 1.15 mmol/L (ref 1.15–1.40)
Chloride: 105 mmol/L (ref 98–111)
Creatinine, Ser: 1.2 mg/dL (ref 0.61–1.24)
Glucose, Bld: 118 mg/dL — ABNORMAL HIGH (ref 70–99)
HCT: 45 % (ref 39.0–52.0)
Hemoglobin: 15.3 g/dL (ref 13.0–17.0)
Potassium: 3.8 mmol/L (ref 3.5–5.1)
Sodium: 140 mmol/L (ref 135–145)
TCO2: 22 mmol/L (ref 22–32)

## 2024-01-13 LAB — LIPASE, BLOOD: Lipase: 33 U/L (ref 11–51)

## 2024-01-13 MED ORDER — FENTANYL CITRATE PF 50 MCG/ML IJ SOSY
50.0000 ug | PREFILLED_SYRINGE | Freq: Once | INTRAMUSCULAR | Status: AC
  Administered 2024-01-13: 50 ug via INTRAMUSCULAR
  Filled 2024-01-13: qty 1

## 2024-01-13 NOTE — ED Triage Notes (Signed)
 Pt in with pain to LLQ, onset 2 days ago but pain sharply worsened through the night. Pt denies any n/v/d or urinary problems

## 2024-01-13 NOTE — ED Notes (Signed)
 Pt provided urinal for collection. Pt unable to void at this time.

## 2024-01-13 NOTE — ED Notes (Signed)
 Pt provided d/c paperwork and expressed understanding. Pt adv. His daughter is picking him up and instructed not to drive after narcotic administration.

## 2024-01-13 NOTE — Discharge Instructions (Addendum)
 Today you presented to the emergency department with pain in your left lower pelvis.  You were diagnosed with a left-sided inguinal hernia.  As long as this hernia remains soft and without skin discoloration you are safe to follow-up with a general surgeon or your primary care physician's office for referral to a general surgeon for an elective repair.  If it anytime the area becomes severely painful, hard, or develops skin color changes please return to emergency department promptly.  All complication of a hernia is when it gets stuck and loses blood supply.  The symptoms of severe pain, nausea or vomiting, tissue feeling hard, or changes in the skin color around the hernia are all signs of this.  This would warrant a more emergent surgical repair which is why we recommend prompt return to the emergency department.

## 2024-01-13 NOTE — ED Notes (Signed)
 Patient went to bathroom and tried to get urine spec. Unable to at this time. Will continue to try.

## 2024-01-13 NOTE — ED Provider Notes (Addendum)
 [place] EMERGENCY DEPARTMENT AT [facility] Provider Note   CSN: [id number] Arrival date & time: 01/13/24  9360     Patient presents with: Abdominal Pain   Louis Bryan is a 88 y.o. male.   Patient is an 88 year old male with past medical history of hypertension, hyperlipidemia, tobacco use, and pulmonary embolism presenting for complaints pelvic pain.  Patient admits to pain in the left lower pelvic region just before his scrotum and testes.  He denies any rashes.  He denies any skin lesions or skin color changes.  Pain is worse with coughing or draining to have a bowel movement.  He denies any nausea, vomiting, diarrhea, fevers, chills.  He denies black or bloody stools.    The history is provided by the patient. No language interpreter was used.  Abdominal Pain Associated symptoms: no chest pain, no chills, no cough, no dysuria, no fever, no hematuria, no shortness of breath, no sore throat and no vomiting        Prior to Admission medications   Medication Sig Start Date End Date Taking? Authorizing Provider  allopurinol  (ZYLOPRIM ) 100 MG tablet Take 1 tablet (100 mg total) by mouth daily. 07/11/17   Juliane Che, PA  Docusate Calcium (STOOL SOFTENER PO) Take 1 capsule by mouth every evening.    [provider]  HYDROcodone -acetaminophen  (NORCO/VICODIN) 5-325 MG tablet Take 1 tablet by mouth every 4 (four) hours as needed. 02/05/23   Dean Clarity, MD  losartan -hydrochlorothiazide  (HYZAAR) 50-12.5 MG tablet Take 1 tablet by mouth daily. 08/28/17   Juliane Che, PA  methocarbamol  (ROBAXIN ) 500 MG tablet Take 0.5 tablets (250 mg total) by mouth every 8 (eight) hours as needed. 12/11/23   Patsey Lot, MD  metoprolol  succinate (TOPROL -XL) 25 MG 24 hr tablet Take 1 tablet (25 mg total) by mouth daily. with food 08/28/17   Juliane Che, PA  oxymetazoline  (AFRIN NASAL SPRAY) 0.05 % nasal spray Place 1 spray into both nostrils 2 (two) times daily. 01/12/23    Soldatova, Liuba, MD  rivaroxaban  (XARELTO ) 20 MG TABS tablet Take 1 tablet (20 mg total) by mouth daily with supper. Take with food. Future refills to come from PCP. 05/17/23   Barbarann Dixon B, RPH-CPP  simvastatin  (ZOCOR ) 40 MG tablet Take 40 mg by mouth at bedtime. 09/20/21   [provider]  sodium chloride  (OCEAN) 0.65 % SOLN nasal spray Place 1 spray into both nostrils as needed. 01/12/23   Soldatova, Liuba, MD    Allergies: Patient has no known allergies.    Review of Systems  Constitutional:  Negative for chills and fever.  HENT:  Negative for ear pain and sore throat.   Eyes:  Negative for pain and visual disturbance.  Respiratory:  Negative for cough and shortness of breath.   Cardiovascular:  Negative for chest pain and palpitations.  Gastrointestinal:  Positive for abdominal pain (low pelvis). Negative for vomiting.  Genitourinary:  Negative for dysuria and hematuria.  Musculoskeletal:  Negative for arthralgias and back pain.  Skin:  Negative for color change and rash.  Neurological:  Negative for seizures and syncope.  All other systems reviewed and are negative.   Updated Vital Signs BP (!) 128/100 (BP Location: Right Arm)   Pulse 91   Temp 98.5 F (36.9 C)   Resp (!) 24   Ht 5' 3 (1.6 m)   Wt 78.9 kg   SpO2 96%   BMI 30.82 kg/m   Physical Exam Vitals and nursing note reviewed. Exam  conducted with a chaperone present.  Constitutional:      General: He is not in acute distress.    Appearance: He is well-developed.  HENT:     Head: Normocephalic and atraumatic.  Eyes:     Conjunctiva/sclera: Conjunctivae normal.  Cardiovascular:     Rate and Rhythm: Normal rate and regular rhythm.     Heart sounds: No murmur heard. Pulmonary:     Effort: Pulmonary effort is normal. No respiratory distress.     Breath sounds: Normal breath sounds.  Abdominal:     Palpations: Abdomen is soft.     Tenderness: There is no abdominal tenderness.   Genitourinary:   Musculoskeletal:        General: No swelling.     Cervical back: Neck supple.  Skin:    General: Skin is warm and dry.     Capillary Refill: Capillary refill takes less than 2 seconds.  Neurological:     Mental Status: He is alert.  Psychiatric:        Mood and Affect: Mood normal.     (all labs ordered are listed, but only abnormal results are displayed) Labs Reviewed  COMPREHENSIVE METABOLIC PANEL WITH GFR - Abnormal; Notable for the following components:      Result Value   CO2 19 (*)    Glucose, Bld 119 (*)    Anion gap 17 (*)    All other components within normal limits  CBC - Abnormal; Notable for the following components:   WBC 10.9 (*)    All other components within normal limits  I-STAT CHEM 8, ED - Abnormal; Notable for the following components:   Glucose, Bld 118 (*)    All other components within normal limits  LIPASE, BLOOD    EKG: None  Radiology: No results found.   Hernia reduction  Date/Time: 01/13/2024 8:40 AM  Performed by: Elnor Bernarda SQUIBB, DO Authorized by: Elnor Bernarda SQUIBB, DO  Consent: Verbal consent obtained Consent given by: patient Patient identity confirmed: verbally with patient, hospital-assigned identification number and anonymous protocol, patient vented/unresponsive Time out: Immediately prior to procedure a time out was called to verify the correct patient, procedure, equipment, support staff and site/side marked as required.  Sedation: Patient sedated: Analgesia with fentanyl .  Patient tolerance: patient tolerated the procedure well with no immediate complications      Medications Ordered in the ED  fentaNYL  (SUBLIMAZE ) injection 50 mcg (50 mcg Intramuscular Given 01/13/24 0756)                                    Medical Decision Making Amount and/or Complexity of Data Reviewed Labs: ordered. Radiology: ordered.  Risk Prescription drug management.   88 year old male with past medical history of  hypertension, hyperlipidemia, tobacco use, and pulmonary embolism presenting for complaints pelvic pain.  Patient is alert and active entry, no acute distress, afebrile, stable vital signs.  Abdominal exam demonstrates no discomfort.  Pelvic exam demonstrates a left-sided inguinal hernia with tenderness to palpation.  Hernia soft and without discoloration.  No scrotal involvement or penile involvement.  Labs are stable without signs or symptoms of sepsis.  Stable liver profile, lipase, and renal function.  Given fentanyl  and left-sided inguinal hernia reduced.  No incarceration or strangulation at this time.  This time I do not recommend any CT imaging as the hernia is soft and easy to reduce.  Recommend close follow-up with  general surgery, her PCP for for referral to general surgeon, for evaluation for repair.  Patient in no distress and overall condition improved here in the ED. Detailed discussions were had with the patient regarding current findings, and need for close f/u with PCP or on call doctor. The patient has been instructed to return immediately if the symptoms worsen in any way for re-evaluation. Patient verbalized understanding and is in agreement with current care plan. All questions answered prior to discharge.      Final diagnoses:  Inguinal hernia, left    ED Discharge Orders     None          Elnor Bernarda SQUIBB, DO 01/13/24 0840    Elnor Bernarda SQUIBB, DO 01/13/24 [phone]

## 2024-01-13 NOTE — ED Triage Notes (Signed)
 Pt bib POV c/o LLQ pain that has been going on for two days. Pt states it hurts worse when he cough. Pt says he didn't injure himself, food doesn't affect it, and no dark stools or blood.   Pt denies GI hx  Pt last BM yesterday.

## 2024-01-28 ENCOUNTER — Other Ambulatory Visit: Payer: Self-pay

## 2024-01-28 ENCOUNTER — Encounter (HOSPITAL_COMMUNITY): Payer: Self-pay

## 2024-01-28 ENCOUNTER — Emergency Department (HOSPITAL_COMMUNITY)

## 2024-01-28 ENCOUNTER — Emergency Department (HOSPITAL_COMMUNITY)
Admission: EM | Admit: 2024-01-28 | Discharge: 2024-01-28 | Disposition: A | Discharge status: Home / Self Care | Attending: Emergency Medicine | Admitting: Emergency Medicine

## 2024-01-28 DIAGNOSIS — Z7901 Long term (current) use of anticoagulants: Secondary | ICD-10-CM | POA: Insufficient documentation

## 2024-01-28 DIAGNOSIS — J209 Acute bronchitis, unspecified: Secondary | ICD-10-CM | POA: Insufficient documentation

## 2024-01-28 DIAGNOSIS — R059 Cough, unspecified: Secondary | ICD-10-CM | POA: Diagnosis present

## 2024-01-28 LAB — TROPONIN I (HIGH SENSITIVITY): Troponin I (High Sensitivity): 4 ng/L (ref <18)

## 2024-01-28 LAB — BASIC METABOLIC PANEL WITH GFR
Anion gap: 9 (ref 5–15)
BUN: 15 mg/dL (ref 8–23)
CO2: 25 mmol/L (ref 22–32)
Calcium: 8.7 mg/dL — ABNORMAL LOW (ref 8.9–10.3)
Chloride: 107 mmol/L (ref 98–111)
Creatinine, Ser: 1.25 mg/dL — ABNORMAL HIGH (ref 0.61–1.24)
GFR, Estimated: 56 mL/min — ABNORMAL LOW (ref >60)
Glucose, Bld: 104 mg/dL — ABNORMAL HIGH (ref 70–99)
Potassium: 3.9 mmol/L (ref 3.5–5.1)
Sodium: 141 mmol/L (ref 135–145)

## 2024-01-28 LAB — CBC
HCT: 43.2 % (ref 39.0–52.0)
Hemoglobin: 14 g/dL (ref 13.0–17.0)
MCH: 30.7 pg (ref 26.0–34.0)
MCHC: 32.4 g/dL (ref 30.0–36.0)
MCV: 94.7 fL (ref 80.0–100.0)
Platelets: 205 K/uL (ref 150–400)
RBC: 4.56 MIL/uL (ref 4.22–5.81)
RDW: 15 % (ref 11.5–15.5)
WBC: 6.8 K/uL (ref 4.0–10.5)
nRBC: 0 % (ref 0.0–0.2)

## 2024-01-28 MED ORDER — AZITHROMYCIN 250 MG PO TABS
500.0000 mg | ORAL_TABLET | Freq: Once | ORAL | Status: AC
  Administered 2024-01-28: 500 mg via ORAL
  Filled 2024-01-28: qty 2

## 2024-01-28 MED ORDER — PREDNISONE 20 MG PO TABS
60.0000 mg | ORAL_TABLET | ORAL | Status: AC
  Administered 2024-01-28: 60 mg via ORAL
  Filled 2024-01-28: qty 3

## 2024-01-28 MED ORDER — AZITHROMYCIN 250 MG PO TABS: 250.0000 mg | ORAL_TABLET | Freq: Every day | ORAL | 0 refills | Status: AC

## 2024-01-28 MED ORDER — PREDNISONE 20 MG PO TABS: 40.0000 mg | ORAL_TABLET | Freq: Every day | ORAL | 0 refills | Status: AC

## 2024-01-28 NOTE — ED Triage Notes (Addendum)
 PT arrives via POV. PT reports chest pain,  slight sob, and headache since yesterday. States he has been having coughing spells that start the symptoms. States he did spit up some blood this morning, which was after having a nosebleed yesterday. PT is AxOx4. PT reports he's on xalrelto. NAD. States his cough is productive.

## 2024-01-28 NOTE — ED Provider Notes (Signed)
 [place] EMERGENCY DEPARTMENT AT [facility] Provider Note   CSN: [id number] Arrival date & time: 01/28/24  9173     Patient presents with: Chest Pain, Headache, and Cough   Louis Bryan is a 88 y.o. male.   HPI Patient presents with concern of ongoing cough, chest pain, mild dyspnea.  He does describe hemoptysis, though after coughing episodes.  No syncope, no abdominal pain, no vomiting.  He is on Xarelto , has been compliant with his medication.  Onset was yesterday.    Prior to Admission medications   Medication Sig Start Date End Date Taking? Authorizing Provider  azithromycin  (ZITHROMAX ) 250 MG tablet Take 1 tablet (250 mg total) by mouth daily for 4 days. Take 1 every day until finished. 01/28/24 02/01/24 Yes Garrick Charleston, MD  predniSONE  (DELTASONE ) 20 MG tablet Take 2 tablets (40 mg total) by mouth daily with breakfast. For the next four days 01/28/24  Yes Garrick Charleston, MD  allopurinol  (ZYLOPRIM ) 100 MG tablet Take 1 tablet (100 mg total) by mouth daily. 07/11/17   Juliane Che, PA  Docusate Calcium (STOOL SOFTENER PO) Take 1 capsule by mouth every evening.    [provider]  HYDROcodone -acetaminophen  (NORCO/VICODIN) 5-325 MG tablet Take 1 tablet by mouth every 4 (four) hours as needed. 02/05/23   Dean Clarity, MD  losartan -hydrochlorothiazide  (HYZAAR) 50-12.5 MG tablet Take 1 tablet by mouth daily. 08/28/17   Juliane Che, PA  methocarbamol  (ROBAXIN ) 500 MG tablet Take 0.5 tablets (250 mg total) by mouth every 8 (eight) hours as needed. 12/11/23   Patsey Lot, MD  metoprolol  succinate (TOPROL -XL) 25 MG 24 hr tablet Take 1 tablet (25 mg total) by mouth daily. with food 08/28/17   Juliane Che, PA  oxymetazoline  (AFRIN NASAL SPRAY) 0.05 % nasal spray Place 1 spray into both nostrils 2 (two) times daily. 01/12/23   Soldatova, Liuba, MD  rivaroxaban  (XARELTO ) 20 MG TABS tablet Take 1 tablet (20 mg total) by mouth daily with supper. Take with food.  Future refills to come from PCP. 05/17/23   Barbarann Dixon B, RPH-CPP  simvastatin  (ZOCOR ) 40 MG tablet Take 40 mg by mouth at bedtime. 09/20/21   [provider]  sodium chloride  (OCEAN) 0.65 % SOLN nasal spray Place 1 spray into both nostrils as needed. 01/12/23   Soldatova, Liuba, MD    Allergies: Patient has no known allergies.    Review of Systems  Updated Vital Signs BP 136/82   Pulse 63   Temp 99.1 F (37.3 C)   Resp (!) 26   SpO2 98%   Physical Exam Vitals and nursing note reviewed.  Constitutional:      General: He is not in acute distress.    Appearance: He is well-developed.  HENT:     Head: Normocephalic and atraumatic.  Eyes:     Conjunctiva/sclera: Conjunctivae normal.  Cardiovascular:     Rate and Rhythm: Normal rate and regular rhythm.  Pulmonary:     Effort: Pulmonary effort is normal. No respiratory distress.     Breath sounds: No stridor.  Abdominal:     General: There is no distension.  Skin:    General: Skin is warm and dry.  Neurological:     Mental Status: He is alert and oriented to person, place, and time.     (all labs ordered are listed, but only abnormal results are displayed) Labs Reviewed  BASIC METABOLIC PANEL WITH GFR - Abnormal; Notable for the following components:  Result Value   Glucose, Bld 104 (*)    Creatinine, Ser 1.25 (*)    Calcium 8.7 (*)    GFR, Estimated 56 (*)    All other components within normal limits  CBC  TROPONIN I (HIGH SENSITIVITY)  TROPONIN I (HIGH SENSITIVITY)    EKG: EKG Interpretation Date/Time:  Sunday January 28 2024 09:04:24 EDT Ventricular Rate:  76 PR Interval:  162 QRS Duration:  97 QT Interval:  402 QTC Calculation: 452 R Axis:   258  Text Interpretation: Sinus rhythm S1,S2,S3 pattern Confirmed by Quinlyn Tep ([id number]) on 01/28/2024 9:12:19 AM  Radiology: DG Chest 2 View Result Date: 01/28/2024 CLINICAL DATA:  Chest pain, shortness of breath. EXAM: CHEST - 2 VIEW COMPARISON:   October 21, 2021. FINDINGS: The heart size and mediastinal contours are within normal limits. Both lungs are clear. The visualized skeletal structures are unremarkable. IMPRESSION: No active cardiopulmonary disease. Electronically Signed   By: James  Green Jr M.D.   On: 01/28/2024 09:03     Procedures   Medications Ordered in the ED  azithromycin (ZITHROMAX) tablet 500 mg (has no administration in time range)  predniSONE (DELTASONE) tablet 60 mg (has no administration in time range)                                    Medical Decision Making Well-appearing elderly male anticoagulated due to history of pulmonary embolism presents with cough, congestion, posttussive hemoptysis.  Patient is awake, alert, in no distress, hemodynamically unremarkable.  He does have mild tachypnea suggesting respiratory illness.  Low suspicion for PE, as the patient is anticoagulated, seemingly compliant with his medication.  Consideration of heart failure as well, though this is less likely given the absence of edema, increased work of breathing, abnormal breath sounds.   Pulse ox 100% room air normal  cardiac 70 sinus normal  Amount and/or Complexity of Data Reviewed External Data Reviewed: notes. Labs: ordered. Decision-making details documented in ED Course. Radiology: ordered and independent interpretation performed. Decision-making details documented in ED Course. ECG/medicine tests: ordered and independent interpretation performed. Decision-making details documented in ED Course.  Risk Prescription drug management. Decision regarding hospitalization. Diagnosis or treatment significantly limited by social determinants of health.  Initial troponin reassuring 10:53 AM Patient in no distress, smiling.  Patient without tachypnea at rest.  No hypoxia, no increased work of breathing, labs reassuring, to normal troponin, nonischemic EKG, and no chest pain all reassuring for low suspicion of ACS.  Without  hypoxia, increased work of breathing, and with complaints of anticoagulation, less likely PE, no evidence for pneumonia.  With history of smoking, suspicion for COPD exacerbation/bronchitis. At bedside, with his daughter on speaker phone I discussed all findings, plan for medications, follow-up, and reiterated the patient home care, rest, follow-up as well. He reiterates he has no abdominal pain, no current chest pain, and with reassuring vitals and is discharged in stable condition.     Final diagnoses:  Acute bronchitis, unspecified organism    ED Discharge Orders          Ordered    azithromycin (ZITHROMAX) 250 MG tablet  Daily        01/28/24 1053    predniSONE (DELTASONE) 20 MG tablet  Daily with breakfast        09 /21/25 1053               Garrick Charleston, MD 01/28/24 1053

## 2024-01-28 NOTE — ED Notes (Signed)
 PLEASE UPDATE FAMILY [phone]

## 2024-01-28 NOTE — Discharge Instructions (Signed)
 With a history of smoking in the distant past, and your medical problems including pulmonary embolism and is very important that you speak with your primary care physician tomorrow to ensure appropriate ongoing outpatient management of your cough and shortness of breath.  Symptoms are likely due to bronchitis, which is inflammation of the lungs, possibly due to a virus.  Your symptoms should improve with your prescribed medications.  If they do not, return here or follow-up with your physician.

## 2024-02-14 LAB — LAB REPORT - SCANNED
A1c: 6.1
EGFR: 56

## 2024-02-21 ENCOUNTER — Telehealth (HOSPITAL_BASED_OUTPATIENT_CLINIC_OR_DEPARTMENT_OTHER): Payer: Self-pay

## 2024-02-21 NOTE — Telephone Encounter (Signed)
 1st attempt to reach pt regarding surgical clearance and the need for an TELE appointment. Unable to leave voicemail as option not available

## 2024-02-21 NOTE — Telephone Encounter (Signed)
 Patient with diagnosis of DVT/PE on Xarelto  for anticoagulation.    Procedure: inguinal hernia Date of procedure: TBD   CrCl 47 ml/min Platelet count 228K   Per office protocol, patient can hold Xarelto  for 2 days prior to procedure.      **This guidance is not considered finalized until pre-operative APP has relayed final recommendations.**

## 2024-02-21 NOTE — Telephone Encounter (Signed)
   Name: Louis Bryan  DOB: 1935-11-13  MRN: [id number]  Primary Cardiologist: Alm Clay, MD   Preoperative team, please contact this patient and set up a phone call appointment for further preoperative risk assessment. Please obtain consent and complete medication review. Thank you for your help.  I confirm that guidance regarding antiplatelet and oral anticoagulation therapy has been completed and, if necessary, noted below.  Per office protocol, patient can hold Xarelto  for 2 days prior to procedure.    I also confirmed the patient resides in the state of [place] . As per [facility] Medical Board telemedicine laws, the patient must reside in the state in which the provider is licensed.   Louis Satterfield, NP 02/21/2024, 4:38 PM [facility]

## 2024-02-21 NOTE — Telephone Encounter (Signed)
 Pharmacy please advise on holding Xarelto  prior to Inguinal Hernia  scheduled for TBD. Last labs (CBC)02/14/2024; (BMET)01/28/2024. Thank you.

## 2024-02-21 NOTE — Telephone Encounter (Signed)
   Pre-operative Risk Assessment    Patient Name: Louis Bryan  DOB: 05/15/1935 MRN: [id number]   Date of last office visit: 06/09/23 with Dr. Anner Date of next office visit: N/A    Request for Surgical Clearance    Procedure:  Inguinal Hernia  Date of Surgery:  Clearance TBD                                Surgeon:  Herlene Bureau, MD Surgeon's Group or Practice Name:  [facility]  Phone number:  [phone] Fax number:  [phone]   Type of Clearance Requested:   - Medical  - Pharmacy:  Hold Rivaroxaban  (Xarelto )     Type of Anesthesia:  General    Additional requests/questions:    [name]   02/21/2024, 3:39 PM

## 2024-02-22 ENCOUNTER — Telehealth (HOSPITAL_BASED_OUTPATIENT_CLINIC_OR_DEPARTMENT_OTHER): Payer: Self-pay | Admitting: *Deleted

## 2024-02-22 NOTE — Telephone Encounter (Signed)
 Pt has been scheduled tele preop appt 02/27/24. Med rec and consent are done.     Patient Consent for Virtual Visit        Louis Bryan has provided verbal consent on 02/22/2024 for a virtual visit (video or telephone).   CONSENT FOR VIRTUAL VISIT FOR:  Louis Bryan  By participating in this virtual visit I agree to the following:  I hereby voluntarily request, consent and authorize [facility] and its employed or contracted physicians, physician assistants, nurse practitioners or other licensed health care professionals (the Practitioner), to provide me with telemedicine health care services (the "Services) as deemed necessary by the treating Practitioner. I acknowledge and consent to receive the Services by the Practitioner via telemedicine. I understand that the telemedicine visit will involve communicating with the Practitioner through live audiovisual communication technology and the disclosure of certain medical information by electronic transmission. I acknowledge that I have been given the opportunity to request an in-person assessment or other available alternative prior to the telemedicine visit and am voluntarily participating in the telemedicine visit.  I understand that I have the right to withhold or withdraw my consent to the use of telemedicine in the course of my care at any time, without affecting my right to future care or treatment, and that the Practitioner or I may terminate the telemedicine visit at any time. I understand that I have the right to inspect all information obtained and/or recorded in the course of the telemedicine visit and may receive copies of available information for a reasonable fee.  I understand that some of the potential risks of receiving the Services via telemedicine include:  Delay or interruption in medical evaluation due to technological equipment failure or disruption; Information transmitted may not be sufficient (e.g. poor resolution of  images) to allow for appropriate medical decision making by the Practitioner; and/or  In rare instances, security protocols could fail, causing a breach of personal health information.  Furthermore, I acknowledge that it is my responsibility to provide information about my medical history, conditions and care that is complete and accurate to the best of my ability. I acknowledge that Practitioner's advice, recommendations, and/or decision may be based on factors not within their control, such as incomplete or inaccurate data provided by me or distortions of diagnostic images or specimens that may result from electronic transmissions. I understand that the practice of medicine is not an exact science and that Practitioner makes no warranties or guarantees regarding treatment outcomes. I acknowledge that a copy of this consent can be made available to me via my patient portal [facility] MyChart), or I can request a printed copy by calling the office of [facility].    I understand that my insurance will be billed for this visit.   I have read or had this consent read to me. I understand the contents of this consent, which adequately explains the benefits and risks of the Services being provided via telemedicine.  I have been provided ample opportunity to ask questions regarding this consent and the Services and have had my questions answered to my satisfaction. I give my informed consent for the services to be provided through the use of telemedicine in my medical care

## 2024-02-22 NOTE — Telephone Encounter (Signed)
 Pt has been scheduled tele preop appt 02/27/24. Med rec and consent are done.

## 2024-02-27 ENCOUNTER — Ambulatory Visit: Attending: Internal Medicine

## 2024-02-27 DIAGNOSIS — Z01810 Encounter for preprocedural cardiovascular examination: Secondary | ICD-10-CM

## 2024-02-27 NOTE — Progress Notes (Signed)
   Name:  Louis Bryan  DOB:  [birth year]  MRN:  [id number]   Primary Cardiologist: Alm Clay, MD  Chart reviewed as part of pre-operative protocol coverage. Patient was contacted 02/27/2024 in reference to pre-operative risk assessment for pending surgery as outlined below.  Proctor Carriker was last seen on 06/09/23 by Dr. Clay.  Since that day, Ladarius Seubert has done well, patient denies chest pain or new symptoms however notes concerns as his PCP reported that he had EKG changes at his office visit last week and recommended following up with Dr. Clay in person.  As patient prefers an in office appointment for review will no charge visit today and have preop team reach out for an office appointment.  Pre-op covering staff: - Please schedule appointment and call patient to inform them. If patient already had an upcoming appointment within acceptable timeframe, please add pre-op clearance to the appointment notes so provider is aware. - Please contact requesting surgeon's office via preferred method (i.e, phone, fax) to inform them of need for appointment prior to surgery.  Per PCPs notes patient has been directed to hold Xarelto  for 2 days prior to procedure.  Jaxson Anglin D Estephan Gallardo, NP 02/27/2024, 9:30 AM

## 2024-02-27 NOTE — Telephone Encounter (Addendum)
 Per preop APP Rosabel ORN. NP I called the pt and scheduled in office appt. Pt states he had an EKG done last week with PCP and was told it was abnormal.   Pt has been scheduled to see Lum Louis, NP 02/29/24 @ 10:30. Pt has been given [address] address with verbal read back.    I will see if our chart prep Team may be able to get the EKG from PCP for appt 02/29/24.   I will update all parties involved.

## 2024-02-29 ENCOUNTER — Ambulatory Visit: Attending: Emergency Medicine | Admitting: Emergency Medicine

## 2024-02-29 ENCOUNTER — Encounter: Payer: Self-pay | Admitting: Emergency Medicine

## 2024-02-29 VITALS — BP 116/66 | HR 91 | Ht 67.0 in | Wt 173.2 lb

## 2024-02-29 DIAGNOSIS — E119 Type 2 diabetes mellitus without complications: Secondary | ICD-10-CM

## 2024-02-29 DIAGNOSIS — Z01810 Encounter for preprocedural cardiovascular examination: Secondary | ICD-10-CM | POA: Diagnosis not present

## 2024-02-29 DIAGNOSIS — I10 Essential (primary) hypertension: Secondary | ICD-10-CM | POA: Diagnosis not present

## 2024-02-29 DIAGNOSIS — E782 Mixed hyperlipidemia: Secondary | ICD-10-CM | POA: Diagnosis not present

## 2024-02-29 DIAGNOSIS — I2609 Other pulmonary embolism with acute cor pulmonale: Secondary | ICD-10-CM

## 2024-02-29 DIAGNOSIS — I4589 Other specified conduction disorders: Secondary | ICD-10-CM

## 2024-02-29 DIAGNOSIS — I8290 Acute embolism and thrombosis of unspecified vein: Secondary | ICD-10-CM

## 2024-02-29 NOTE — Patient Instructions (Signed)
 Medication Instructions:  NO CHANGES  Lab Work: NONE TO BE DONE TODAY.  Testing/Procedures: NONE  Follow-Up: At [facility], you and your health needs are our priority.  As part of our continuing mission to provide you with exceptional heart care, our providers are all part of one team.  This team includes your primary Cardiologist (physician) and Advanced Practice Providers or APPs (Physician Assistants and Nurse Practitioners) who all work together to provide you with the care you need, when you need it.  Your next appointment:   1 YEAR  Provider:   Alm Clay, MD OR Lum Louis, NP  We recommend signing up for the patient portal called MyChart.  Sign up information is provided on this After Visit Summary.  MyChart is used to connect with patients for Virtual Visits (Telemedicine).  Patients are able to view lab/test results, encounter notes, upcoming appointments, etc.  Non-urgent messages can be sent to your provider as well.   To learn more about what you can do with MyChart, go to [email or web address].

## 2024-02-29 NOTE — Progress Notes (Signed)
 Cardiology Office Note:    Date:  02/29/2024  ID:  Louis Bryan, DOB 1935-12-10, MRN [id number] PCP: Juliane Che, PA  [facility] Providers Cardiologist:  Alm Clay, MD       Patient Profile:       Chief Complaint: Preoperative clearance History of Present Illness:  Louis Bryan is a 88 y.o. male with visit-pertinent history of pulmonary embolism, DVT, hypertension, hyperlipidemia, chronotropic incompetence (medication related)  History of PE back in 2023 treated initially with Eliquis  complicated by epistaxis.  He was seen September 2023 discussed results of Myoview  stress test echocardiogram ordered for evaluation of exertional dyspnea and chest tightness shortly after submassive PE.  He was doing well at that time.  Echocardiogram 10/2021 with LVEF 60 to 65%, no RWMA, grade 1 DD, RV function size normal, normal PSAP, no significant valvular abnormalities.  Lexiscan Myoview  12/2021 was normal low risk study with normal perfusion and left ventricular regional global systolic function.  Rate related RBBB is noted.  Was seen in the ER 02/05/2023 as he came in with low back pain radiating to his right leg found to have acute DVT of right peroneal posterior tibial veins.  He was last seen in clinic on 06/09/2023 by Dr. Clay.  He was doing well without symptoms of chest pain shortness of breath palpitations or syncope.  His blood pressure cholesterol and blood sugars were well-controlled.  He was to follow-up with cardiology only as needed.  He is now pending inguinal hernia surgery on date TBD with [facility] and needs cardiac clearance.   Discussed the use of AI scribe software for clinical note transcription with the patient, who gave verbal consent to proceed.  History of Present Illness Louis Bryan is an 88 year old male who presents today for preoperative clearance for inguinal hernia surgery.  Today he is doing well without cardiovascular concerns.  He remains  active, engaging in daily activities such as walking, cutting grass, and preparing meals.  He denies any chest pains or exertional symptoms.  Has chronic shortness of breath that is unchanged.  No orthopnea, PND, LEE, palpitations  He remains adherent to his Xarelto  without missing any doses.  He walks 15-20 minutes daily when weather permits and engages in other household activities.   No syncope, presyncope, lightheadedness, dizziness, melena, manage  Review of systems:  Please see the history of present illness. All other systems are reviewed and otherwise negative.      Studies Reviewed:    EKG Interpretation Date/Time:  Thursday February 29 2024 10:34:31 EDT Ventricular Rate:  91 PR Interval:  156 QRS Duration:  126 QT Interval:  390 QTC Calculation: 479 R Axis:   -44  Text Interpretation: Normal sinus rhythm Left axis deviation Right bundle branch block When compared with ECG of 28-Jan-2024 09:04, PREVIOUS ECG IS PRESENT Confirmed by Rana Dixon [phone]) on 02/29/2024 12:47:47 PM    Echocardiogram 10/22/2021  1. Left ventricular ejection fraction, by estimation, is 60 to 65%. The  left ventricle has normal function. The left ventricle has no regional  wall motion abnormalities. Left ventricular diastolic parameters are  consistent with Grade I diastolic  dysfunction (impaired relaxation).   2. Right ventricular systolic function is normal. The right ventricular  size is normal. There is normal pulmonary artery systolic pressure.   3. The mitral valve is normal in structure. No evidence of mitral valve  regurgitation. No evidence of mitral stenosis.   4. The aortic valve is tricuspid. There is  mild calcification of the  aortic valve. Aortic valve regurgitation is not visualized. Aortic valve  sclerosis is present, with no evidence of aortic valve stenosis.   5. The inferior vena cava is normal in size with greater than 50%  respiratory variability, suggesting right  atrial pressure of 3 mmHg.   Lexiscan Myoview  12/29/2021   The study is normal. The study is low risk.   No ST deviation was noted.   Left ventricular function is normal. Nuclear stress EF: 61 %. The left ventricular ejection fraction is normal (55-65%). End diastolic cavity size is normal.   Prior study available for comparison from 12/11/2003. No changes compared to prior study.   Low risk stress nuclear study with normal perfusion and normal left ventricular regional and global systolic function. Rate related RBBB is noted.  Risk Assessment/Calculations:              Physical Exam:   VS:  BP 116/66 (BP Location: Right Arm, Patient Position: Sitting, Cuff Size: Normal)   Pulse 91   Ht 5' 7 (1.702 m)   Wt 173 lb 3.2 oz (78.6 kg)   SpO2 95%   BMI 27.13 kg/m    Wt Readings from Last 3 Encounters:  02/29/24 173 lb 3.2 oz (78.6 kg)  01/13/24 174 lb (78.9 kg)  12/11/23 174 lb (78.9 kg)    GEN: Well nourished, well developed in no acute distress NECK: No JVD; No carotid bruits CARDIAC: RRR, no murmurs, rubs, gallops RESPIRATORY:  Clear to auscultation without rales, wheezing or rhonchi  ABDOMEN: Soft, non-tender, non-distended EXTREMITIES:  No edema; No acute deformity      Assessment and Plan:  Hypertension Blood pressure today is 116/66 and well-controlled - Continue metoprolol  succinate 25 mg daily and losartan -hydrochlorothiazide  50-12.5 mg daily  Hyperlipidemia LDL 67 on 02/2024 and well-controlled - Continue simvastatin  40 mg daily  History of pulmonary embolism History of recurrent DVT History of submassive PE back in 2023 initially treated with Eliquis  complicated by epistaxis.  Per report, despite continued Eliquis  he had left lower extremity DVT in September 2024 and was converted to Xarelto  at that time - Today he is without acute shortness of breath, chest pains, leg pain, or leg swelling - Continue Xarelto  20 mg daily - Management per PCP  T2DM A1c 6.1% on  02/2024 - Management per PCP  Chronotropic incompetence Medication related and has since resolved with notable improvement with reduction of his metoprolol  - Stable and controlled  Preoperative cardiovascular clearance According to the Revised Cardiac Risk Index (RCRI), his Perioperative Risk of Major Cardiac Event is (%): 0.4. His Functional Capacity in METs is: 5.62 according to the Duke Activity Status Index (DASI). Therefore, based on ACC/AHA guidelines, patient would be at acceptable risk for the planned procedure without further cardiovascular testing. I will route this recommendation to the requesting party via Epic fax function.  Per office protocol, patient can hold Xarelto  for 2 days prior to procedure.       Dispo:  Return in about 1 year (around 02/28/2025).  Signed, Lum LITTIE Louis, NP

## 2024-03-24 IMAGING — CR DG CHEST 2V
2 series · 2 of 2 positions shown · non-contrast
Comparison: Chest x-ray 10/02/2015.

CLINICAL DATA: 86-year-old male with history of left shoulder and
left upper chest pain.

EXAM:
CHEST - 2 VIEW

[chest pa]
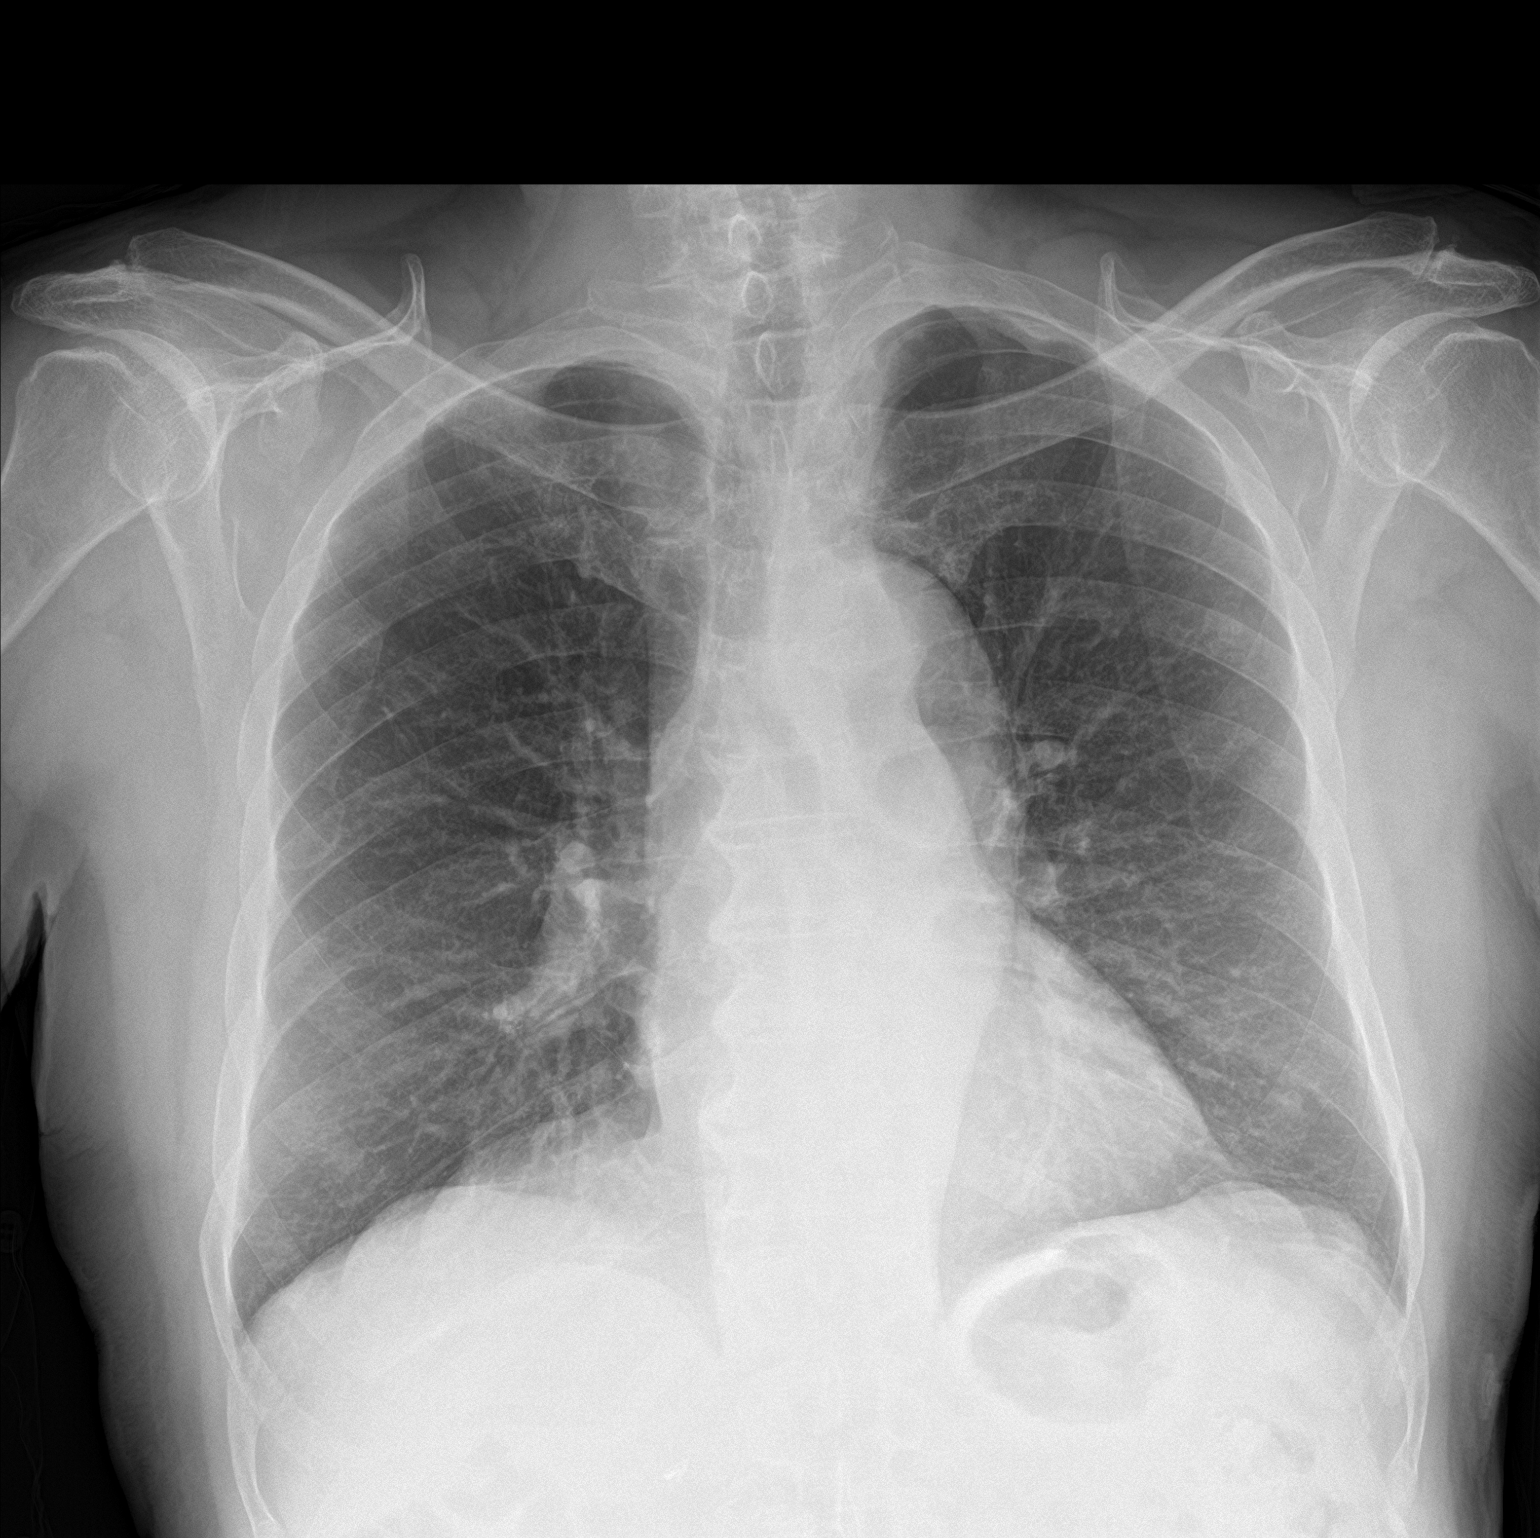

[chest lat]
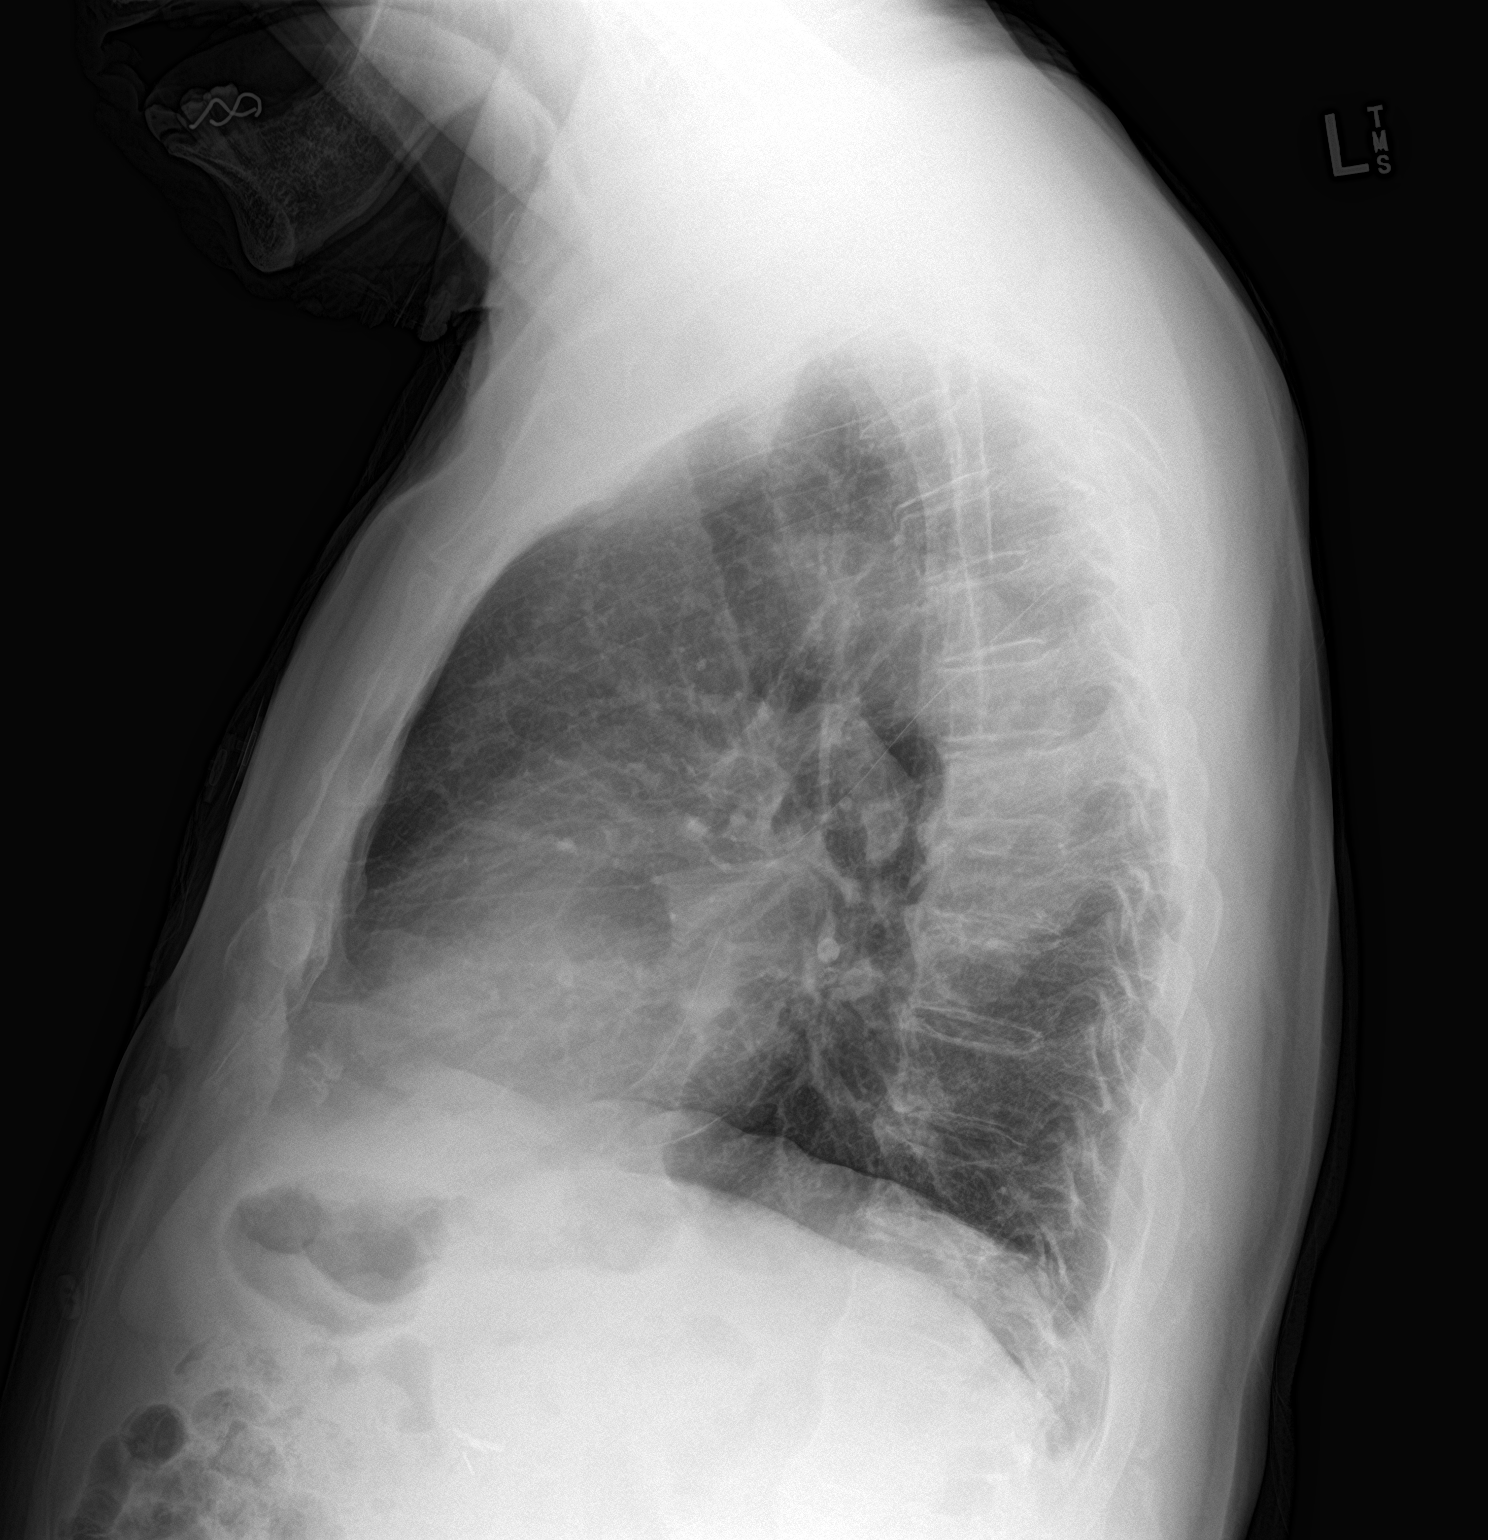

[2 of 2 positions shown; findings below may reference images not displayed]

FINDINGS: Lung volumes are normal. No consolidative airspace disease. No
pleural effusions. No pneumothorax. No pulmonary nodule or mass
noted. Pulmonary vasculature and the cardiomediastinal silhouette
are within normal limits. Atherosclerosis in the thoracic aorta.
IMPRESSION: 1.  No radiographic evidence of acute cardiopulmonary disease.
2. Aortic atherosclerosis.

## 2024-04-02 ENCOUNTER — Telehealth: Payer: Self-pay | Admitting: Student-PharmD

## 2024-04-02 MED ORDER — RIVAROXABAN 20 MG PO TABS: 20.0000 mg | ORAL_TABLET | Freq: Every day | ORAL | 0 refills | Status: AC

## 2024-04-02 NOTE — Telephone Encounter (Signed)
 Received call from patient's pharmacy requesting refill of Xarelto . Per last prescription sent in, we informed the patient that future refills of this need to come from his PCP. Attempted to call patient but phone kept ringing. Will send in a 1 month supply and detail on the sig instructions again that refills must come from his PCP as we are no longer managing this and cardiology has also noted that PCP needs to manage long-term.

## 2024-05-16 ENCOUNTER — Other Ambulatory Visit: Payer: Self-pay | Admitting: Student-PharmD

## 2024-05-16 NOTE — Telephone Encounter (Signed)
 Declining Xarelto  refill request as we are no longer managing his anticoagulation. Previously sent in a 1 month supply of Xarelto  with note that future fills need to come from his PCP. Cardiology has also noted that PCP needs to manage his anticoagulation long term. Attempted to call patient to let him know, but phone kept ringing with no option to leave a voicemail. [facility] pharmacy and she stated they have already notified the patient a couple times that he needs to request a Xarelto  refill from his PCP, but suspect he forgot. She confirmed she would let him know again. I also requested that [facility] send a refill request to his PCP's office, Bernita Ned, PA-C at [facility], which she said she would do.

## 2024-05-22 ENCOUNTER — Ambulatory Visit: Payer: Self-pay | Admitting: General Surgery

## 2024-06-18 ENCOUNTER — Encounter (HOSPITAL_COMMUNITY): Admission: RE | Admit: 2024-06-18

## 2024-06-20 ENCOUNTER — Encounter (HOSPITAL_COMMUNITY): Admission: RE | Payer: Self-pay | Source: Home / Self Care

## 2024-06-20 ENCOUNTER — Ambulatory Visit (HOSPITAL_COMMUNITY): Admit: 2024-06-20 | Admitting: General Surgery

## 2024-06-20 SURGERY — REPAIR, HERNIA, INGUINAL, ADULT
Anesthesia: General | Laterality: Left
